# Patient Record
Sex: Female | Born: 1960
Health system: Southern US, Community
[De-identification: ages and names within clinical notes are randomized; demographics above are authoritative.]

## PROBLEM LIST (undated history)

## (undated) DIAGNOSIS — R4189 Other symptoms and signs involving cognitive functions and awareness: Secondary | ICD-10-CM

## (undated) DIAGNOSIS — F329 Major depressive disorder, single episode, unspecified: Secondary | ICD-10-CM

## (undated) DIAGNOSIS — K589 Irritable bowel syndrome without diarrhea: Secondary | ICD-10-CM

## (undated) DIAGNOSIS — I10 Essential (primary) hypertension: Secondary | ICD-10-CM

## (undated) DIAGNOSIS — I7381 Erythromelalgia: Secondary | ICD-10-CM

## (undated) DIAGNOSIS — I498 Other specified cardiac arrhythmias: Secondary | ICD-10-CM

## (undated) DIAGNOSIS — G43909 Migraine, unspecified, not intractable, without status migrainosus: Secondary | ICD-10-CM

## (undated) DIAGNOSIS — I499 Cardiac arrhythmia, unspecified: Secondary | ICD-10-CM

## (undated) DIAGNOSIS — R112 Nausea with vomiting, unspecified: Secondary | ICD-10-CM

## (undated) DIAGNOSIS — E785 Hyperlipidemia, unspecified: Secondary | ICD-10-CM

## (undated) DIAGNOSIS — F32A Depression, unspecified: Secondary | ICD-10-CM

## (undated) DIAGNOSIS — D751 Secondary polycythemia: Secondary | ICD-10-CM

## (undated) DIAGNOSIS — D649 Anemia, unspecified: Secondary | ICD-10-CM

## (undated) DIAGNOSIS — Z9889 Other specified postprocedural states: Secondary | ICD-10-CM

## (undated) DIAGNOSIS — R000 Tachycardia, unspecified: Secondary | ICD-10-CM

## (undated) DIAGNOSIS — F419 Anxiety disorder, unspecified: Secondary | ICD-10-CM

## (undated) DIAGNOSIS — R748 Abnormal levels of other serum enzymes: Secondary | ICD-10-CM

## (undated) HISTORY — DX: Depression, unspecified: F32.A

## (undated) HISTORY — DX: Other symptoms and signs involving cognitive functions and awareness: R41.89

## (undated) HISTORY — DX: Major depressive disorder, single episode, unspecified: F32.9

## (undated) HISTORY — DX: Essential (primary) hypertension: I10

## (undated) HISTORY — DX: Anemia, unspecified: D64.9

## (undated) HISTORY — DX: Irritable bowel syndrome, unspecified: K58.9

## (undated) HISTORY — DX: Anxiety disorder, unspecified: F41.9

## (undated) HISTORY — PX: CARPAL TUNNEL RELEASE: SHX101

## (undated) HISTORY — DX: Tachycardia, unspecified: R00.0

## (undated) HISTORY — DX: Migraine, unspecified, not intractable, without status migrainosus: G43.909

## (undated) HISTORY — DX: Other specified cardiac arrhythmias: I49.8

## (undated) HISTORY — PX: OTHER SURGICAL HISTORY: SHX169

## (undated) HISTORY — DX: Erythromelalgia: I73.81

## (undated) HISTORY — DX: Hyperlipidemia, unspecified: E78.5

## (undated) HISTORY — PX: TRIGGER FINGER RELEASE: SHX641

## (undated) HISTORY — DX: Secondary polycythemia: D75.1

## (undated) HISTORY — DX: Cardiac arrhythmia, unspecified: I49.9

## (undated) HISTORY — PX: TUBAL LIGATION: SHX77

---

## 1998-07-18 ENCOUNTER — Other Ambulatory Visit: Admission: RE | Admit: 1998-07-18 | Discharge: 1998-07-18 | Payer: Self-pay | Admitting: Obstetrics and Gynecology

## 1998-12-06 ENCOUNTER — Other Ambulatory Visit: Admission: RE | Admit: 1998-12-06 | Discharge: 1998-12-06 | Payer: Self-pay | Admitting: Obstetrics and Gynecology

## 1999-06-07 ENCOUNTER — Ambulatory Visit (HOSPITAL_BASED_OUTPATIENT_CLINIC_OR_DEPARTMENT_OTHER): Admission: RE | Admit: 1999-06-07 | Discharge: 1999-06-07 | Payer: Self-pay | Admitting: Orthopedic Surgery

## 1999-12-11 ENCOUNTER — Other Ambulatory Visit: Admission: RE | Admit: 1999-12-11 | Discharge: 1999-12-11 | Payer: Self-pay | Admitting: Obstetrics and Gynecology

## 2000-10-27 ENCOUNTER — Encounter: Payer: Self-pay | Admitting: Emergency Medicine

## 2000-10-27 ENCOUNTER — Emergency Department (HOSPITAL_COMMUNITY): Admission: EM | Admit: 2000-10-27 | Discharge: 2000-10-27 | Payer: Self-pay | Admitting: Emergency Medicine

## 2001-03-05 ENCOUNTER — Other Ambulatory Visit: Admission: RE | Admit: 2001-03-05 | Discharge: 2001-03-05 | Payer: Self-pay | Admitting: Obstetrics and Gynecology

## 2002-04-01 ENCOUNTER — Other Ambulatory Visit: Admission: RE | Admit: 2002-04-01 | Discharge: 2002-04-01 | Payer: Self-pay | Admitting: Obstetrics and Gynecology

## 2002-05-31 ENCOUNTER — Emergency Department (HOSPITAL_COMMUNITY): Admission: EM | Admit: 2002-05-31 | Discharge: 2002-05-31 | Payer: Self-pay | Admitting: Emergency Medicine

## 2002-05-31 ENCOUNTER — Encounter: Payer: Self-pay | Admitting: Emergency Medicine

## 2002-07-01 ENCOUNTER — Ambulatory Visit (HOSPITAL_COMMUNITY): Admission: RE | Admit: 2002-07-01 | Discharge: 2002-07-02 | Payer: Self-pay | Admitting: Neurological Surgery

## 2002-07-01 ENCOUNTER — Encounter: Payer: Self-pay | Admitting: Neurological Surgery

## 2003-04-21 ENCOUNTER — Other Ambulatory Visit: Admission: RE | Admit: 2003-04-21 | Discharge: 2003-04-21 | Payer: Self-pay | Admitting: Obstetrics and Gynecology

## 2004-05-10 ENCOUNTER — Ambulatory Visit: Payer: Self-pay | Admitting: Internal Medicine

## 2004-05-14 ENCOUNTER — Ambulatory Visit: Payer: Self-pay | Admitting: Internal Medicine

## 2004-05-24 ENCOUNTER — Ambulatory Visit: Payer: Self-pay | Admitting: Internal Medicine

## 2004-05-31 ENCOUNTER — Ambulatory Visit: Payer: Self-pay

## 2004-06-01 ENCOUNTER — Ambulatory Visit (HOSPITAL_COMMUNITY): Admission: RE | Admit: 2004-06-01 | Discharge: 2004-06-01 | Payer: Self-pay | Admitting: Internal Medicine

## 2004-06-14 ENCOUNTER — Other Ambulatory Visit: Admission: RE | Admit: 2004-06-14 | Discharge: 2004-06-14 | Payer: Self-pay | Admitting: Obstetrics and Gynecology

## 2004-09-20 ENCOUNTER — Ambulatory Visit: Payer: Self-pay | Admitting: Internal Medicine

## 2005-10-09 ENCOUNTER — Ambulatory Visit: Payer: Self-pay | Admitting: Internal Medicine

## 2005-10-17 ENCOUNTER — Ambulatory Visit: Payer: Self-pay

## 2005-11-15 ENCOUNTER — Ambulatory Visit: Payer: Self-pay | Admitting: Internal Medicine

## 2005-12-16 ENCOUNTER — Ambulatory Visit: Payer: Self-pay | Admitting: Internal Medicine

## 2005-12-24 ENCOUNTER — Ambulatory Visit: Payer: Self-pay | Admitting: Internal Medicine

## 2006-10-17 ENCOUNTER — Encounter (INDEPENDENT_AMBULATORY_CARE_PROVIDER_SITE_OTHER): Payer: Self-pay | Admitting: *Deleted

## 2006-10-17 LAB — CONVERTED CEMR LAB

## 2006-10-17 LAB — HM MAMMOGRAPHY

## 2006-11-13 ENCOUNTER — Ambulatory Visit: Payer: Self-pay | Admitting: Internal Medicine

## 2006-11-13 LAB — CONVERTED CEMR LAB
BUN: 9 mg/dL (ref 6–23)
CO2: 28 meq/L (ref 19–32)
Calcium: 9.5 mg/dL (ref 8.4–10.5)
Chloride: 100 meq/L (ref 96–112)
Creatinine, Ser: 0.6 mg/dL (ref 0.4–1.2)
GFR calc Af Amer: 139 mL/min
GFR calc non Af Amer: 115 mL/min
Glucose, Bld: 107 mg/dL — ABNORMAL HIGH (ref 70–99)
Potassium: 3.4 meq/L — ABNORMAL LOW (ref 3.5–5.1)
Sodium: 136 meq/L (ref 135–145)

## 2007-01-08 ENCOUNTER — Ambulatory Visit: Payer: Self-pay | Admitting: Internal Medicine

## 2007-02-25 ENCOUNTER — Telehealth: Payer: Self-pay | Admitting: Internal Medicine

## 2007-03-05 ENCOUNTER — Telehealth: Payer: Self-pay | Admitting: Internal Medicine

## 2007-04-28 ENCOUNTER — Encounter: Payer: Self-pay | Admitting: *Deleted

## 2007-04-28 DIAGNOSIS — R000 Tachycardia, unspecified: Secondary | ICD-10-CM | POA: Insufficient documentation

## 2007-04-28 DIAGNOSIS — N920 Excessive and frequent menstruation with regular cycle: Secondary | ICD-10-CM | POA: Insufficient documentation

## 2007-04-28 DIAGNOSIS — F341 Dysthymic disorder: Secondary | ICD-10-CM | POA: Insufficient documentation

## 2007-04-28 DIAGNOSIS — I10 Essential (primary) hypertension: Secondary | ICD-10-CM | POA: Insufficient documentation

## 2007-04-28 DIAGNOSIS — K589 Irritable bowel syndrome without diarrhea: Secondary | ICD-10-CM | POA: Insufficient documentation

## 2007-04-28 DIAGNOSIS — M5412 Radiculopathy, cervical region: Secondary | ICD-10-CM | POA: Insufficient documentation

## 2007-04-28 DIAGNOSIS — G43909 Migraine, unspecified, not intractable, without status migrainosus: Secondary | ICD-10-CM | POA: Insufficient documentation

## 2007-05-28 ENCOUNTER — Ambulatory Visit: Payer: Self-pay | Admitting: Internal Medicine

## 2007-08-27 ENCOUNTER — Ambulatory Visit: Payer: Self-pay | Admitting: Internal Medicine

## 2007-08-27 DIAGNOSIS — M7120 Synovial cyst of popliteal space [Baker], unspecified knee: Secondary | ICD-10-CM | POA: Insufficient documentation

## 2007-12-14 ENCOUNTER — Ambulatory Visit: Payer: Self-pay | Admitting: Internal Medicine

## 2007-12-14 LAB — CONVERTED CEMR LAB
ALT: 26 units/L (ref 0–35)
AST: 22 units/L (ref 0–37)
Cholesterol: 192 mg/dL (ref 0–200)
HDL: 40.2 mg/dL (ref >39.0)
LDL Cholesterol: 129 mg/dL — ABNORMAL HIGH (ref 0–99)
Total CHOL/HDL Ratio: 4.8
Triglycerides: 114 mg/dL (ref 0–149)
VLDL: 23 mg/dL (ref 0–40)

## 2007-12-17 ENCOUNTER — Ambulatory Visit: Payer: Self-pay | Admitting: Internal Medicine

## 2008-02-25 ENCOUNTER — Ambulatory Visit: Payer: Self-pay | Admitting: Internal Medicine

## 2008-09-26 ENCOUNTER — Telehealth: Payer: Self-pay | Admitting: Internal Medicine

## 2008-12-13 ENCOUNTER — Ambulatory Visit: Payer: Self-pay | Admitting: Internal Medicine

## 2009-03-14 ENCOUNTER — Telehealth (INDEPENDENT_AMBULATORY_CARE_PROVIDER_SITE_OTHER): Payer: Self-pay | Admitting: *Deleted

## 2009-07-27 ENCOUNTER — Telehealth: Payer: Self-pay | Admitting: Internal Medicine

## 2009-08-03 ENCOUNTER — Ambulatory Visit: Payer: Self-pay | Admitting: Internal Medicine

## 2009-08-03 DIAGNOSIS — N393 Stress incontinence (female) (male): Secondary | ICD-10-CM | POA: Insufficient documentation

## 2009-08-03 DIAGNOSIS — R7989 Other specified abnormal findings of blood chemistry: Secondary | ICD-10-CM | POA: Insufficient documentation

## 2009-08-03 LAB — CONVERTED CEMR LAB
BUN: 14 mg/dL (ref 6–23)
Basophils Absolute: 0 10*3/uL (ref 0.0–0.1)
Basophils Relative: 0.5 % (ref 0.0–3.0)
CO2: 33 meq/L — ABNORMAL HIGH (ref 19–32)
Calcium: 9.7 mg/dL (ref 8.4–10.5)
Chloride: 98 meq/L (ref 96–112)
Cholesterol: 188 mg/dL (ref 0–200)
Creatinine, Ser: 0.6 mg/dL (ref 0.4–1.2)
Eosinophils Absolute: 0.1 10*3/uL (ref 0.0–0.7)
Eosinophils Relative: 2 % (ref 0.0–5.0)
GFR calc non Af Amer: 110.97 mL/min (ref >60)
Glucose, Bld: 75 mg/dL (ref 70–99)
HCT: 41.7 % (ref 36.0–46.0)
HDL: 48.6 mg/dL (ref >39.00)
Hemoglobin: 14.3 g/dL (ref 12.0–15.0)
LDL Cholesterol: 111 mg/dL — ABNORMAL HIGH (ref 0–99)
Lymphocytes Relative: 25.7 % (ref 12.0–46.0)
Lymphs Abs: 1.8 10*3/uL (ref 0.7–4.0)
MCHC: 34.3 g/dL (ref 30.0–36.0)
MCV: 86.1 fL (ref 78.0–100.0)
Monocytes Absolute: 0.8 10*3/uL (ref 0.1–1.0)
Monocytes Relative: 11.4 % (ref 3.0–12.0)
Neutro Abs: 4.2 10*3/uL (ref 1.4–7.7)
Neutrophils Relative %: 60.4 % (ref 43.0–77.0)
Platelets: 283 10*3/uL (ref 150.0–400.0)
Potassium: 4.1 meq/L (ref 3.5–5.1)
RBC: 4.84 M/uL (ref 3.87–5.11)
RDW: 13.2 % (ref 11.5–14.6)
Sodium: 137 meq/L (ref 135–145)
Total CHOL/HDL Ratio: 4
Triglycerides: 144 mg/dL (ref 0.0–149.0)
VLDL: 28.8 mg/dL (ref 0.0–40.0)
WBC: 6.9 10*3/uL (ref 4.5–10.5)

## 2009-12-15 ENCOUNTER — Ambulatory Visit: Payer: Self-pay | Admitting: Internal Medicine

## 2010-04-17 NOTE — Progress Notes (Signed)
  Phone Note Refill Request Message from:  Fax from Pharmacy  Refills Requested: Medication #1:  PAROXETINE HCL 20 MG  TABS 1 once daily Pt has not been seen since 2009, Please Advise refill.  Initial call taken by: Ami Bullins CMA,  Jul 27, 2009 9:55 AM  Follow-up for Phone Call        ok to fill x 1 month. Needs OV Follow-up by: Jacques Navy MD,  Jul 27, 2009 6:22 PM    Prescriptions: PAROXETINE HCL 20 MG  TABS (PAROXETINE HCL) 1 once daily  #30.0 Each x 0   Entered by:   Ami Bullins CMA   Authorized by:   Jacques Navy MD   Signed by:   Bill Salinas CMA on 07/28/2009   Method used:   Electronically to        [id number]* (retail)       [address]       [place], [place]  [address]       Ph: [phone]       Fax: [phone]   RxID:   [id number]

## 2010-04-17 NOTE — Assessment & Plan Note (Signed)
Summary: needs prescription/does not want cpxl/cd   Vital Signs:  Patient profile:   50 year old female Height:      66 inches Weight:      161 pounds BMI:     26.08 O2 Sat:      96 % on Room air Temp:     97.3 degrees F oral Pulse rate:   54 / minute BP sitting:   98 / 62  (left arm) Cuff size:   regular  Vitals Entered By: Bill Salinas CMA (Aug 03, 2009 11:05 AM)  O2 Flow:  Room air CC: follow-up visit, pt here for refill on meds, all medications need to be sent to [facility] on [address] [facility] in compt/ ab   Primary Care Provider:  Jacques Navy MD  CC:  follow-up visit, pt here for refill on meds, and all medications need to be sent to [facility] on [address] and [facility] in compt/ ab.  History of Present Illness: Patient is here for yearly check up and medication refill. She reports good health with no major changes recently. She has been to her OB/Gyn within the past month for full exam and pap.    She reports that she is currently at the lowest stress level she has been at for a while.  She is currently caring for both of her parents.   She is planning to look for a job in the near future.  Current Medications (verified): 1)  Enalapril-Hydrochlorothiazide 10-25 Mg  Tabs (Enalapril-Hydrochlorothiazide) .Marland Kitchen.. 1 Once Daily 2)  Paroxetine Hcl 20 Mg  Tabs (Paroxetine Hcl) .Marland Kitchen.. 1 Once Daily 3)  Metoprolol Tartrate 25 Mg  Tabs (Metoprolol Tartrate) .Marland Kitchen.. 1 By Mouth Daily  Allergies (verified): 1)  ! Codeine  Past History:  Past Medical History: Last updated: 04/28/2007 TACHYCARDIA (ICD-785.0) MENORRHAGIA (ICD-626.2) IRRITABLE BOWEL SYNDROME (ICD-564.1) HYPERTENSION (ICD-401.9) MIGRAINE HEADACHE (ICD-346.90) DEPRESSION/ANXIETY (ICD-300.4)    Past Surgical History: Last updated: 04/28/2007 * BILATERAL MYOFASCIAL RELEASE FOR RECURRENT PLANTAR FACIITIS. SPONDYLOSIS, CERVICAL, WITH RADICULOPATHY (ICD-723.4) TUBAL LIGATION, HX OF (ICD-V26.51)  Social  History: Last updated: 05/28/2007 1 year [employer] married '86-divorced after 71yrs; remarried '96 No children, 2 step-children Work - Airline pilot, now unemployed.  Family History: father -'79: HTN, DM, CAD/CABG, CHF, PVD/PAD, Lipids, pancreatitis mother - '36: breast cancer-left mastectomy; ITP; asthma; TIA's; alzheimers Neg- colon cancer maternal GM- Alzheimer's  Review of Systems       The patient complains of weight loss, weight gain, and incontinence.  The patient denies anorexia, fever, vision loss, decreased hearing, chest pain, dyspnea on exertion, peripheral edema, prolonged cough, headaches, abdominal pain, melena, hematochezia, muscle weakness, and depression.         Patient had lost approximately 40 pounds since her last visit in 2009.  She has subsequently regained approximately 20pounds.  Reports some stress incontinence with cough/sneeze.  Physical Exam  General:  An alert,  Rubenesque woman in no apparent distress. Head:  normocephalic and atraumatic.   Eyes:  vision grossly intact, pupils equal, pupils round, pupils reactive to light, pupils react to accomodation, corneas and lenses clear, no injection, no iris abnormalities, no optic disk abnormalities, and no retinal abnormalitiies.   Ears:  R ear normal, L ear normal, and no external deformities.   Nose:  no external deformity.   Mouth:  good dentition and pharynx pink and moist.   Neck:  supple, no masses, no thyromegaly, and no thyroid nodules or tenderness.   Lungs:  normal respiratory effort,  no accessory muscle use, normal breath sounds, no crackles, and no wheezes.   Heart:  regular rhythm, no murmur, no gallop, no rub, no JVD, and bradycardia.   Abdomen:  soft, non-tender, no distention, no masses, no guarding, and no rigidity.   Msk:  normal ROM, no joint tenderness, no joint swelling, no joint warmth, no redness over joints, and no joint deformities.   Pulses:  R radial normal, R dorsalis pedis  normal, L radial normal, and L dorsalis pedis normal.   Extremities:  no pedal edema Neurologic:  alert & oriented X3, cranial nerves II-XII intact, strength normal in all extremities, gait normal, and DTRs symmetrical and normal.   Skin:  turgor normal and color normal.     Impression & Recommendations:  Problem # 1:  HYPERTENSION (ICD-401.9) Blood pressure in office today is well below goal with bradycardia.    Plan- Discontinue Metoprolol          Continue Enalapril-HCTZ         Check electrolytes   Her updated medication list for this problem includes:    Enalapril-hydrochlorothiazide 10-25 Mg Tabs (Enalapril-hydrochlorothiazide) .Marland Kitchen... 1 once daily    Metoprolol Tartrate 25 Mg Tabs (Metoprolol tartrate) .Marland Kitchen... 1 by mouth daily  Orders: TLB-BMP (Basic Metabolic Panel-BMET) (80048-METABOL)  Problem # 2:  DEPRESSION/ANXIETY (ICD-300.4) Patient feels that she is at a relatively low stress level at this time.  By her report there are issues in regard to her conjugal relationship. She reports continued benefit from Paroxetine with stabilization of mood in response to various stressors.  Plan- Continue Paroxetine HCl 20mg  once daily as patient continues to derive benefit.  Problem # 3:  Preventive Health Care (ICD-V70.0) Patient has had recent breast and pelvic exam with Dr. Tenny Craw.  She had a normal Pap and Mammogram in April of 2011. Will do routine labwork today. She has not had an eye exam in 2+ years but will make an appointment for an eye exam soon.  Problem # 4:  URINARY INCONTINENCE, STRESS, FEMALE, MILD (ICD-625.6) Patient reports onset of occasional small amounts of incontinence with cough/sneeze.  Plan- She was encouraged to try to do Kegel exercises regularly.  Complete Medication List: 1)  Enalapril-hydrochlorothiazide 10-25 Mg Tabs (Enalapril-hydrochlorothiazide) .Marland Kitchen.. 1 once daily 2)  Paroxetine Hcl 20 Mg Tabs (Paroxetine hcl) .Marland Kitchen.. 1 once daily 3)  Metoprolol Tartrate  25 Mg Tabs (Metoprolol tartrate) .Marland Kitchen.. 1 by mouth daily  Other Orders: TLB-Lipid Panel (80061-LIPID) TLB-CBC Platelet - w/Differential (85025-CBCD)   Avri Curet Note: All result statuses are Final unless otherwise noted.  Tests: (1) BMP (METABOL)   Sodium                    137 mEq/L                   135-145   Potassium                 4.1 mEq/L                   3.5-5.1   Chloride                  98 mEq/L                    96-112   Carbon Dioxide       [H]  33 mEq/L  19-32   Glucose                   75 mg/dL                    16-10   BUN                       14 mg/dL                    9-60   Creatinine                0.6 mg/dL                   4.5-4.0   Calcium                   9.7 mg/dL                   9.8-11.9   GFR                       110.97 mL/min               >60  Tests: (2) Lipid Panel (LIPID)   Cholesterol               188 mg/dL                   1-478     ATP III Classification            Desirable:  < 200 mg/dL                    Borderline High:  200 - 239 mg/dL               High:  > = 240 mg/dL   Triglycerides             144.0 mg/dL                 2.9-562.1     Normal:  <150 mg/dL     Borderline High:  308 - 199 mg/dL   HDL                       65.78 mg/dL                 >46.96   VLDL Cholesterol          28.8 mg/dL                  2.9-52.8   LDL Cholesterol      [H]  413 mg/dL                   2-44  CHO/HDL Ratio:  CHD Risk                             4                    Men          Women     1/2 Average Risk     3.4          3.3     Average Risk          5.0  4.4     2X Average Risk          9.6          7.1     3X Average Risk          15.0          11.0                           Tests: (3) CBC Platelet w/Diff (CBCD)   White Cell Count          6.9 K/uL                    4.5-10.5   Red Cell Count            4.84 Mil/uL                 3.87-5.11   Hemoglobin                14.3 g/dL                    02.5-85.2   Hematocrit                41.7 %                      36.0-46.0   MCV                       86.1 fl                     78.0-100.0   MCHC                      34.3 g/dL                   77.8-24.2   RDW                       13.2 %                      11.5-14.6   Platelet Count            283.0 K/uL                  150.0-400.0   Neutrophil %              60.4 %                      43.0-77.0   Lymphocyte %              25.7 %                      12.0-46.0   Monocyte %                11.4 %                      3.0-12.0   Eosinophils%              2.0 %                       0.0-5.0   Basophils %  0.5 %                       0.0-3.0   Neutrophill Absolute      4.2 K/uL                    1.4-7.7   Lymphocyte Absolute       1.8 K/uL                    0.7-4.0   Monocyte Absolute         0.8 K/uL                    0.1-1.0  Eosinophils, Absolute                             0.1 K/uL                    0.0-0.7   Basophils Absolute        0.0 K/uL                    0.0-0.1Prescriptions: PAROXETINE HCL 20 MG  TABS (PAROXETINE HCL) 1 once daily  #30.0 Each x 12   Entered and Authorized by:   Jacques Navy MD   Signed by:   Jacques Navy MD on 08/03/2009   Method used:   Electronically to        [facility] #[id number]* (retail)       [address]       [place], [place]  [address]       Ph: [phone]       Fax: [phone]   RxID:   [id number] ENALAPRIL-HYDROCHLOROTHIAZIDE 10-25 MG  TABS (ENALAPRIL-HYDROCHLOROTHIAZIDE) 1 once daily  #30.0 Each x 12   Entered and Authorized by:   Jacques Navy MD   Signed by:   Jacques Navy MD on 08/03/2009   Method used:   Electronically to        [facility] #[id number]* (retail)       [address]       [place], [place]  [address]       Ph: [phone]       Fax: [phone]   RxID:   [id number]

## 2010-04-17 NOTE — Assessment & Plan Note (Signed)
Summary: FLU SHOT/MEN Natale Milch  Nurse Visit   Allergies: 1)  ! Codeine  Orders Added: 1)  Admin 1st Vaccine [90471] 2)  Flu Vaccine 51yrs + [29562] Flu Vaccine Consent Questions     Do you have a history of severe allergic reactions to this vaccine? no    Any prior history of allergic reactions to egg and/or gelatin? no    Do you have a sensitivity to the preservative Thimersol? no    Do you have a past history of Guillan-Barre Syndrome? no    Do you currently have an acute febrile illness? no    Have you ever had a severe reaction to latex? no    Vaccine information given and explained to patient? yes    Are you currently pregnant? no    Lot Number:[id number]   Exp Date:09/15/2010   Site Given  Left Deltoid IM .lbflu

## 2010-07-31 NOTE — Assessment & Plan Note (Signed)
[facility]                           PRIMARY CARE OFFICE NOTE   NAME:[name] [name], [name] [name]                       MRN:          [id number]  DATE:11/13/2006                            DOB:          06-Jan-1961    [name] [name] is a 50 year old woman, well known to the practice, who  presents for followup evaluation and exam.  She was last seen December 24, 2005.   INTERVAL HISTORY:  1. Cardiovascular:  The patient has been evaluated for tach      arrhythmia.  She has been seen by Dr. Berton Mount.  She is on      metoprolol which helps keep her rapid heart rate under good      control.  2. Health maintenance:  The patient reports she is up to date with her      gynecologist, Dr. Tenny Craw, with an examination in August of 2008 and a      last mammogram in August of 2008.   PAST MEDICAL/FAMILY/SOCIAL HISTORY:  Well documented in my note of  May 14, 2004 without particular change.   SOCIAL HISTORY UPDATE:  The patient continues to remain unemployed  voluntarily.  She and her husband have moved into a new home which they  have had renovated.  She reports her marriage is in good health.   CURRENT MEDICATIONS:  1. Hyzaar 50/12.5 one daily.  2. Metoprolol 50 mg daily.  3. Estrostep FE daily.  4. Paxil CR 12.5 mg daily.   REVIEW OF SYSTEMS:  Negative for any fever, sweats or chills.  She has  had a 10-pound weight gain since 2006.  She has had an eye exam in the  last 24 months.  The patient has had no cardiovascular complaints except  for the history of present illness.  No respiratory problems.  The  patient does have some mildly increased indigestion treated with over-  the-counter antacids.  No GU or musculoskeletal complaints.   PHYSICAL EXAMINATION:  Temperature was 97.7, blood pressure 107/74,  pulse 68, weight 179.  GENERAL APPEARANCE:  This is a heavyset Caucasian woman in no acute  distress.  HEENT EXAM:  Normocephalic, atraumatic.  EACs and TMs  were unremarkable.  Oropharynx with native dentition in good repair.  No buccal or palate  lesions were noted.  Posterior pharynx was clear.  Conjunctivae and  sclerae were clear.  PERRLA.  EOMI.  Funduscopic exam was unremarkable.  NECK:  Supple without thyromegaly.  NODES:  No lymphadenopathy was noted in the cervical or supraclavicular  regions.  CHEST:  No CVA tenderness.  LUNGS:  Clear to auscultation and percussion.  CARDIOVASCULAR:  2+ radial pulses, no JVD or carotid bruits.  She had a  quiet precordium with a regular rate and rhythm.  No murmurs, rubs, or  gallops were appreciated.  BREAST EXAM:  Deferred to gynecology.  ABDOMEN:  Soft, no guarding, no rebound.  No organosplenomegaly was  noted.  PELVIC/RECTAL EXAM:  Deferred to gynecology.  EXTREMITIES:  Without clubbing, cyanosis, edema, no deformities were  noted.   LABORATORY DATA:  Basic  metabolic panel revealed a sodium of 136,  potassium of 3.4, glucose was 107, BUN 9, creatinine 0.6.   ASSESSMENT AND PLAN:  1. Hypertension:  The patient's blood pressure is very well controlled      on her present medications.  She will continue the same.  2. Cardiovascular:  The patient with a tachycardia/tach arrhythmia,      followed and evaluated by Dr. Graciela Husbands, currently doing well at this      time on metoprolol which she will continue.  3. Irritable bowel syndrome:  Stable.  4. Anxiety:  The patient is doing very well with Paxil CR and she will      continue the same.  5. Health maintenance:  The patient is currently up to date with her      gynecologist.  She would be a candidate for colorectal cancer      screening at age 27.   In summary, this is a very pleasant woman who seems to be medically  stable.  She is asked to return to see me in 1 year or on a p.r.n.  basis.     Rosalyn Gess Norins, MD  Electronically Signed    MEN/MedQ  DD: 11/14/2006  DT: 11/14/2006  Job #: [id number]   cc:   Ms. Greggory Keen

## 2010-08-03 NOTE — Op Note (Signed)
NAME:  [name] [name], [name] [name]                          ACCOUNT NO.:  [id number]   MEDICAL RECORD NO.:  [id number]                   PATIENT TYPE:  OIB   LOCATION:  2861                                 FACILITY:  [facility]   PHYSICIAN:  Stefani Dama, M.D.               DATE OF BIRTH:  09/19/1960   DATE OF PROCEDURE:  07/01/2002  DATE OF DISCHARGE:                                 OPERATIVE REPORT   PREOPERATIVE DIAGNOSES:  C5-6, C6-7 spondylosis with cervical radiculopathy  on the left.   POSTOPERATIVE DIAGNOSES:  C5-6, C6-7 spondylosis with cervical radiculopathy  on the left.   PROCEDURE:  Anterior cervical decompression of C5-6 and C6-7. Arthrodesis  with structural allograft, Synthes plate fixation Z6-X0.   SURGEON:  Stefani Dama, M.D.   FIRST ASSISTANT:  Cristi Loron, M.D.   ANESTHESIA:  General endotracheal.   INDICATIONS FOR PROCEDURE:  The patient is a 50 year old individual who has  had significant back, neck pain, headache and left arm pain. She has severe  spondylitic disease of C5-6 and the C6-7 level with weakness in the left arm  in the C7 and the C6 distribution. After having symptoms for a prolonged  period of time, I advised that she should undergo surgical decompression of  the 2 levels where she has evidence of cord compression; though she is not  frankly myelopathic.   DESCRIPTION OF PROCEDURE:  The patient was brought to the operating room  supine on the stretcher. After the smooth induction of general endotracheal  anesthesia, she was placed on 5 pounds of Holter traction, neck was shaved,  prepped with DuraPrep and draped in a sterile fashion.  Transverse incision  was created on the left side of the neck and carried down through the  platysma. The plane between the sternocleidomastoid and the strap muscles  was dissected bluntly until the prevertebral space was reached.   First identifiable disk space was noted to be that of C6-7. Dissection was  then carried cephalad to expose the C5-6 disk space. The longus colli muscle  was stripped off of either side and the self-retaining Caspar retractor was  placed in the wound.  Diskectomy was then carried out at C5-6 using a #15  blade to open the anterior longitudinal ligament and also using a Kerrison  rongeurs to remove ventral osteophyte.  As the disk space was entered, it  was cleared and opened to either side, removing significant posterior  osteophytes from the inferior margin of the body of C5, using a high-speed  air drill and 0.3 mm dissecting tool. Uncinate process spurs were then  similarly removed from either side.  Lateral aspects were then cleared until  the C6 nerve root on either side was well decompressed, removing significant  bony foraminal stenosis, in addition to the soft tissue that was in the  foramen.   There was subligamentous disk herniation behind the  superior margin of the  body of C6.  With this being decompressed, a graft was fitted. This was a 6  mm lordotic graft from the Synthes set which was packed with the patient's  own bone from the uncinate process spurs, which were cut into small portions  and fitted into the center of the bone graft.   Attention was then turned to C6-7 where a similar procedure was carried out.  Here, the endplates were similarly drilled. The spurs were noted to be  significantly smaller and the lateral recesses were well decompressed.  Hemostasis was achieved in the epidural spaces. Then a 7 mm slightly  lordotic graft was placed into the interspace at C6-C7 level. A 37 mm  standard type Synthes plate was then affixed to the ventral aspect of the  vertebral bodies with 6 locking 4 x 14 mm screws.  Hemostasis was achieved  in the soft tissues. Localization ws checked carefully radiographically, and  then the platysma was closed with 2-0 Vicryl in interrupted fashion and 3-0  Vicryl was used to close the subcuticular tissues.   DermaBond was placed on  the skin.  The patient tolerated the procedure well, taken to the recovery  room in stable condition.                                                  Stefani Dama, M.D.    Merla Riches  D:  07/01/2002  T:  07/01/2002  Job:  [id number]

## 2010-08-03 NOTE — Op Note (Signed)
[place]  Patient:    [name] [name], [name] [name]                       MRN: [id number] Proc. Date: 06/07/99 Adm. Date:  [id number] Attending:  Colbert Ewing                           Operative Report  PREOPERATIVE DIAGNOSIS:  Right carpal tunnel syndrome.  POSTOPERATIVE DIAGNOSIS:  Right carpal tunnel syndrome.  OPERATIVE PROCEDURE:  Right carpal tunnel release.  SURGEON:  Loreta Ave, M.D.  ASSISTANT:  Arlys John D. Petrarca, P.A.  ANESTHESIA:  IV regional with sedation.  SPECIMENS:  None.  CULTURES:  None.  COMPLICATIONS:  None.  DRESSING:  Soft compressive with bulky hand dressing and splint.  PROCEDURE:  Patient was brought to the operating room and placed on the operating table in the supine position.  After adequate anesthesia had been obtained, prepped and draped in the usual sterile fashion with a forearm tourniquet inflated and n place.  Curved incision along the thenar eminence extending slightly ulnarward t the distal wrist crease.  Skin and subcutaneous tissue were divided. Hemostasis was obtained with bipolar cautery.  Flexor retinaculum exposed and incised under direct visualization from the forearm and fascia proximally to the palmar arch distally.  Median nerve had moderate constriction throughout and a very tight epineurium.  Epineurotomy and carpal tunnel release markedly improved this. Digital branches, motor branches identified, protected, and completely decompressed.  Wound irrigated.  No other abnormalities within the canal.  Skln  closed with mattress nylon suture.  Sterile compressive dressing applied after Marcaine infiltrated the margin of the wound.  Bulky hand dressing and splint applied.  Tourniquet deflated and anesthesia reversed.  Brought to recovery room. Tolerated surgery well, no complications. DD:  06/07/99 TD:  06/07/99 Job: [id number]

## 2010-08-03 NOTE — Letter (Signed)
November 15, 2005     [name] [name]. Norins, MD  [address]  [place], [place] [address]   RE:  [name], [name]  MRN:  [id number]  /  DOB:  [birth year]   Dear Kathlene November,  Greggory Keen was seen today at your request because of her abnormal event  monitor.  Recalling that her complaints were that she was having excessive  tachycardia at her workouts that were felt to be precluding of her ability  to lose weight, Holter monitor was obtained that demonstrated both sinus  tachycardia as well as frequent ventricular ectopy, which she was known to  have, which appeared to __________  from the right ventricular outflow  tract.   On further review of her history, a couple of things became clear.  1) She  has some orthostatic intolerance.  2) She has some orthostatic intolerance  raising the question about whether she was having some __________  .  In  addition, there was an issue of being aware that her heart rate was racing  fast and that it was my suspicion that this does not represent __________  rather sinus tachycardia based on this monitor.   Because of this we undertook a treadmill test that was very illuminating.  The first part, stage I, her heart rate reached 110 at one minute of  exercise.  At two minutes of exercise, now in stage II, her heart rate was  131, and at three minutes of exercise, heart rate is 165.  Also she has  significant exercise hypertension with a one-minute post exercise blood  pressure of 220/100 and a three-minute post exercise blood pressure of  188/106.  A seven-minute post exercise blood pressure of 146/106.  All  consistent with significant exercise hypertension.   Because of this, I think that there are two issues.  1) She has  inappropriate sinus tachycardia with exercise.  2) She clearly has  significant exercise hypertension.  I do not know whether she has been  evaluated for secondary causes of hypertension, but I have asked that she  follow up with you  about this.  In the interim, we have given her  prescriptions for a variety of beta blocker to see if she can tolerate any  without significant side effects.  These include atenolol 50 mg, Toprol-XL  50, and Nadolol 40.   She is to let us know how it is that she tolerates this.   Thank you very much for asking [place] to see her.    Sincerely,      Duke Salvia, MD, [facility]   SCK/MedQ  DD:  11/15/2005  DT:  11/17/2005  Job #:  [id number]

## 2010-08-03 NOTE — Assessment & Plan Note (Signed)
[facility]                             PRIMARY CARE OFFICE NOTE   NAME:[name], [name]                       MRN:          [id number]  DATE:12/24/2005                            DOB:          12-22-1960    [name] [name] is a 50 year old woman followed for IBS, hypertension and  palpitations who presents for followup evaluation and exam.  She was last  seen in primary care October 09, 2005.  In the interval she has been seen and  evaluated by Dr. Berton Mount for cardiology/electrophysiology.  The patient  did have a stress test in which she demonstrated sinus tachycardia with  exercise and significant exercise-related hypertension.  She had been given  a variety of beta blockers for trial and did do well with metoprolol generic  ER 50 mg daily.  She reports that she has been doing better with better  exercise tolerance.   The patient reports she is up-to-date with Dr. Tenny Craw, her GYN, with recent  exam in spring 2007.  Also mammography in spring 2007.   Past medical history, family history, and social history are well documented  in my note of May 14, 2004.   CURRENT MEDICATIONS:  1. Celebrex 200 mg daily.  2. Estratest daily.  3. Aspirin 81 mg daily.  4. Paxil 12.5 mg daily.  5. Metoprolol ER 50 mg daily.  6. Tylenol on a p.r.n. basis.   REVIEW OF SYSTEMS:  Negative for any constitutional symptoms.  She has had  an eye exam in the last 12 months.  No ENT complaints.  No cardiovascular  complaints with the information as above.  No respiratory problems.  She has  occasional heartburn for which she uses Tums.  She has occasional IBS  manifested as diarrhea but does not require medication at this time.  No GU  problems.  The patient does have some recurrent plantar fasciitis and she  does follow with Dr. Thurston Hole for this.  She has had surgery for this.  No  dermatologic or neurologic problems.   EXAMINATION:  VITAL SIGNS:  Temperature was 97.1,  blood pressure 130/80,  pulse 56, weight 178.  GENERAL APPEARANCE:  This is a heavyset but well-groomed Caucasian female  who looks her stated age, in no acute distress.  HEENT:  Normocephalic, atraumatic.  EACs and TMs were unremarkable.  Oropharynx with native dentition in good repair.  No buccal or palatal  lesions were noted.  Posterior pharynx was clear.  Conjunctivae and sclerae  were clear.  PERRLA, EOMI.  Funduscopic exam was unremarkable.  NECK:  Supple without thyromegaly.  NODES:  No adenopathy was noted in the cervical or supraclavicular region.  CHEST:  No CVA tenderness.  LUNGS:  Clear to auscultation and percussion.  BREAST:  Exam deferred to gynecology.  CARDIOVASCULAR:  Shows 2+ radial pulse.  No JVD, no carotid bruit.  She had  a quiet precordium with a regular rate and rhythm.  I appreciated no  murmurs.  ABDOMEN:  Obese, soft, no guarding or rebound, or organosplenomegaly was  appreciated.  PELVIC AND RECTAL:  Deferred to gynecology.  EXTREMITIES:  Without clubbing, cyanosis, edema, or deformity.  The patient  does have a small calcium deposit over her tibia at the left leg just below  the knee cap.  NEUROLOGIC:  Nonfocal.  SKIN:  Clear, although a full body exam was not performed.   DATA BASE:  Hemoglobin 14.3 g, white count was 5300 with a normal  differential.  Cholesterol is 163, triglycerides 58, HDL was 38.8, LDL was  113.  Chemistries were unremarkable with a glucose of 102.  Electrolytes  were normal.  Kidney function normal with a creatinine of 0.9 and a GFR of  72 mL/minute.  Liver functions were normal.  Thyroid function normal with a  TSH of 1.49.  Urinalysis was negative.   ASSESSMENT AND PLAN:  1. Cardiovascular:  The patient will exertional tachycardia and a      hypertensive responsive to exercise, currently doing much better on      metoprolol.  Plan:  The patient to continue the same.  2. Migraines, stable.  3. Irritable bowel syndrome,  stable.  4. Depression and anxiety:  The patient is currently doing very well,      tolerating Paxil 12.5 mg daily.  5. Health maintenance:  The patient is current and up-to-date with her      gynecologist.  She would be a candidate for colorectal screening at age      53.   SUMMARY:  A very pleasant woman who seems to be medically stable at this  time.  I have asked her to return to see me on p.r.n. basis.            ______________________________  [name] Gess Norins, MD      MEN/MedQ  DD:  12/25/2005  DT:  12/26/2005  Job #:  [id number]   cc:   Polo Riley. [employer]

## 2010-08-20 ENCOUNTER — Other Ambulatory Visit: Payer: Self-pay | Admitting: Internal Medicine

## 2010-09-03 ENCOUNTER — Other Ambulatory Visit: Payer: Self-pay | Admitting: Internal Medicine

## 2010-10-02 ENCOUNTER — Other Ambulatory Visit: Payer: Self-pay | Admitting: Internal Medicine

## 2010-11-01 ENCOUNTER — Other Ambulatory Visit: Payer: Self-pay | Admitting: Internal Medicine

## 2010-11-28 ENCOUNTER — Other Ambulatory Visit: Payer: Self-pay | Admitting: Internal Medicine

## 2010-12-10 ENCOUNTER — Encounter: Payer: Self-pay | Admitting: Internal Medicine

## 2010-12-11 ENCOUNTER — Ambulatory Visit (INDEPENDENT_AMBULATORY_CARE_PROVIDER_SITE_OTHER): Payer: PRIVATE HEALTH INSURANCE | Admitting: Internal Medicine

## 2010-12-11 ENCOUNTER — Ambulatory Visit: Payer: Self-pay | Admitting: Internal Medicine

## 2010-12-13 ENCOUNTER — Other Ambulatory Visit: Payer: Self-pay | Admitting: *Deleted

## 2010-12-13 MED ORDER — PAROXETINE HCL 20 MG PO TABS: 20.0000 mg | ORAL_TABLET | ORAL | Status: DC

## 2010-12-13 MED ORDER — ENALAPRIL-HYDROCHLOROTHIAZIDE 10-25 MG PO TABS: 1.0000 | ORAL_TABLET | Freq: Every day | ORAL | Status: DC

## 2010-12-13 MED ORDER — METOPROLOL TARTRATE 25 MG PO TABS: 25.0000 mg | ORAL_TABLET | Freq: Two times a day (BID) | ORAL | Status: DC

## 2010-12-25 ENCOUNTER — Ambulatory Visit (INDEPENDENT_AMBULATORY_CARE_PROVIDER_SITE_OTHER): Payer: Commercial Managed Care - PPO | Admitting: Internal Medicine

## 2010-12-25 DIAGNOSIS — G43909 Migraine, unspecified, not intractable, without status migrainosus: Secondary | ICD-10-CM

## 2010-12-25 DIAGNOSIS — R7989 Other specified abnormal findings of blood chemistry: Secondary | ICD-10-CM

## 2010-12-25 DIAGNOSIS — F341 Dysthymic disorder: Secondary | ICD-10-CM

## 2010-12-25 DIAGNOSIS — K589 Irritable bowel syndrome without diarrhea: Secondary | ICD-10-CM

## 2010-12-25 DIAGNOSIS — I10 Essential (primary) hypertension: Secondary | ICD-10-CM

## 2010-12-25 DIAGNOSIS — Z0000 Encounter for general adult medical examination without abnormal findings: Secondary | ICD-10-CM

## 2010-12-25 MED ORDER — PAROXETINE HCL 20 MG PO TABS: 20.0000 mg | ORAL_TABLET | ORAL | Status: DC

## 2010-12-25 MED ORDER — ENALAPRIL-HYDROCHLOROTHIAZIDE 10-25 MG PO TABS: 1.0000 | ORAL_TABLET | Freq: Every day | ORAL | Status: DC

## 2010-12-25 MED ORDER — METOPROLOL TARTRATE 25 MG PO TABS: 25.0000 mg | ORAL_TABLET | Freq: Two times a day (BID) | ORAL | Status: DC

## 2010-12-26 ENCOUNTER — Encounter: Payer: Self-pay | Admitting: Internal Medicine

## 2010-12-26 DIAGNOSIS — Z0000 Encounter for general adult medical examination without abnormal findings: Secondary | ICD-10-CM | POA: Insufficient documentation

## 2010-12-26 NOTE — Progress Notes (Signed)
Subjective:    Patient ID: [name] [name], female    DOB: Aug 12, 1960, 50 y.o.   MRN: [id number]  HPI [name] [name] presents for medical follow-up. She was denied extended refills on her medications due to her last office visit being May '11. Unfortunately she had a previous visit but the schedule was 2 hours behind and she had to return to work. She was rescheduled today for "med refill." Again, schedule was 2 hrs behind. She is appropriately irritated and angry since this is causing her to miss work at a new job! Since she is already here she requests that whatever needs to be done for her to get a year's refill Rx  Be done now!  In the interval since May '11 she denies having any major medical illness or change in condition, any surgery or any injury. She reports that she has seen her Gyn for routine pelvic/PAP and breast exam. She reports that she is current with mammography. She isnot sure what labs her gynecologist may have ordered but is unaware of any problems. She has been adherent to her medcial regimen and has had not adverse medication reactions.  Past Medical History  Diagnosis Date  . Depression   . Anxiety   . Hypertension   . IBS (irritable bowel syndrome)   . Migraine, unspecified, without mention of intractable migraine without mention of status migrainosus   . Tachycardia, unspecified   . Excessive or frequent menstruation   . Brachial neuritis or radiculitis NOS    Past Surgical History  Procedure Date  . Tubal ligation   . Bilateral myofascial release for recurrent plantar faciitis    Family History  Problem Relation Age of Onset  . Cancer Mother     breast cancer-left mastectomy  . Asthma Mother   . Alzheimer's disease Mother   . Transient ischemic attack Mother   . Hypertension Father   . Hyperlipidemia Father   . Diabetes Father   . Coronary artery disease Father   . Heart failure Father     CABG  . Pancreatitis Father   . Alzheimer's disease Maternal  Grandmother    History   Social History  . Marital Status: Married    Spouse Name: N/A    Number of Children: 0  . Years of Education: 13   Occupational History  . sales    Social History Main Topics  . Smoking status: Never Smoker   . Smokeless tobacco: Never Used  . Alcohol Use: Yes  . Drug Use: No  . Sexually Active: Yes -- Female partner(s)   Other Topics Concern  . Not on file   Social History Narrative   HSG. 1 year [employer]. Married '86-divorced after 65yrs; remarried  '96. No children, 2 step children. Work-sales. After a period of being unemployed she has been at a new job for a short period of time (October '12). Marriage is stable.       Review of Systems Constitutional:  Negative for fever, chills, activity change and unexpected weight change.  HEENT:  Negative for hearing loss, ear pain, congestion, neck stiffness and postnasal drip. Negative for sore throat or swallowing problems. Negative for dental complaints.   Eyes: Negative for vision loss or change in visual acuity.  Respiratory: Negative for chest tightness and wheezing. Negative for DOE.   Cardiovascular: Negative for chest pain or palpitations. No decreased exercise tolerance Gastrointestinal: No change in bowel habit. No bloating or gas. No reflux or indigestion Genitourinary:  Negative for urgency, frequency, flank pain and difficulty urinating.  Musculoskeletal: Negative for myalgias, back pain, arthralgias and gait problem.  Neurological: Negative for dizziness, tremors, weakness and headaches.  Hematological: Negative for adenopathy.  Psychiatric/Behavioral: Negative for behavioral problems and dysphoric mood.       Objective:   Physical Exam Vitals reviewed-stable with reasonable blood pressure control Gen'l - WNWD mildly overweight white woman in no distress but a little irritated. HEENT - /AT, C&S clear, PERRLA, EOMI. Oropharynx without lesions. Teeth in good repair.\ Neck-  supple, no thyromegaly Chest - no deformity. Breast- deferred to gyn Pulm - normal respirations with no rales, wheezes or rhonchi. Cor- 2+ radial pulse and DP pulse, RRR w/o murmur rubs or gallops. Abdomen - BS+ x 4 quadrants. No guarding rebound or tenderness. Pelvic - deferred to gyn Ext - no joint deformity or inflmmation. Neuro - A&O x 3, CN II-XII intact, DTR's normal, normal gait. Derm- no lesions on face,neck arms, back.   Lab Results  Component Value Date   WBC 6.9 08/03/2009   HGB 14.3 08/03/2009   HCT 41.7 08/03/2009   PLT 283.0 08/03/2009   GLUCOSE 75 08/03/2009   CHOL 188 08/03/2009   TRIG 144.0 08/03/2009   HDL 48.60 08/03/2009   LDLCALC 111* 08/03/2009   ALT 26 12/14/2007   AST 22 12/14/2007   NA 137 08/03/2009   K 4.1 08/03/2009   CL 98 08/03/2009   CREATININE 0.6 08/03/2009   BUN 14 08/03/2009   CO2 33* 08/03/2009   She was too return for updated labs: metabolic panel, lipids.        Assessment & Plan:

## 2010-12-26 NOTE — Assessment & Plan Note (Signed)
BP Readings from Last 3 Encounters:  12/25/10 104/62  08/03/09 98/62  12/17/07 108/66   Great control on her present medications. Lab ordered.  Plan - continue present medication. Rx provided.

## 2010-12-26 NOTE — Assessment & Plan Note (Signed)
Continues to have headaches - with a frequency of 1-3 per week. She feels this is manageable and additional medication is not needed.

## 2010-12-26 NOTE — Assessment & Plan Note (Signed)
Seemingly stable. She continues to take Paxil with good results.  Plan - Rx renewed x 12

## 2010-12-26 NOTE — Assessment & Plan Note (Signed)
No report of any exacerbation of symptoms or need for additional medical management.

## 2010-12-26 NOTE — Assessment & Plan Note (Signed)
Interval history by her report is benign. Physical exam, sans pelvic and breast exam , is normal. Labs ordered and pending. Last mammogram on record is '08 but she assures [place] taht it is more current than that. She is encouraged to have annual mammograms. Colorectal cancer screening: now at age 50 she should consider having routine screening colonoscopy. Immunization Tdap Sept '10.   In summary-  A woam who appears to be medically stable. Labs are pending. Records of outside lab and of mammogram requested.  In order for her to receive more timely service she will transfer her care to Dr. Felicity Coyer - who is her parent's physician. She is clear for routine care for the year but will need mammogram report(s), PAP results for her record.

## 2011-01-10 ENCOUNTER — Telehealth: Payer: Self-pay | Admitting: Internal Medicine

## 2011-01-10 NOTE — Telephone Encounter (Signed)
PT IS AWARE

## 2011-01-10 NOTE — Telephone Encounter (Signed)
Message copied by Etheleen Sia on Thu Jan 10, 2011  4:14 PM ------      Message from: Rene Paci A      Created: Tue Dec 25, 2010  5:29 PM       Not sure I will be much more on time, but I will be happy to help if possible. Thanks, VAL      (CC to schedulers to notify them of same)            ----- Message -----         From: Duke Salvia, MD         Sent: 12/25/2010   3:38 PM           To: Rene Paci, MD            Vikki Ports,            Mrs. Hunnell is wishing to change MDs to someone who may run more on time than I do. She would be very happy to see you in that you already see her parents and she is very pleased with your care. She is nice patient. I did do her annual exam today and refilled her Rx for the year.            Thanks

## 2011-05-07 ENCOUNTER — Other Ambulatory Visit: Payer: Self-pay | Admitting: Internal Medicine

## 2011-05-07 MED ORDER — PAROXETINE HCL 20 MG PO TABS: 20.0000 mg | ORAL_TABLET | ORAL | Status: DC

## 2011-05-07 NOTE — Telephone Encounter (Signed)
Notified pt rx sent to [facility]... 2/19/@:pm/LMB

## 2011-05-07 NOTE — Telephone Encounter (Signed)
The pt called and is hoping to get a refill of Paroxentine 20mg .   Thanks!

## 2011-12-13 ENCOUNTER — Other Ambulatory Visit: Payer: Self-pay | Admitting: Internal Medicine

## 2012-01-02 ENCOUNTER — Other Ambulatory Visit: Payer: Self-pay | Admitting: *Deleted

## 2012-01-02 MED ORDER — ENALAPRIL-HYDROCHLOROTHIAZIDE 10-25 MG PO TABS: 1.0000 | ORAL_TABLET | Freq: Every day | ORAL | Status: DC

## 2012-03-30 ENCOUNTER — Ambulatory Visit (INDEPENDENT_AMBULATORY_CARE_PROVIDER_SITE_OTHER): Payer: Commercial Managed Care - PPO | Admitting: Internal Medicine

## 2012-03-30 ENCOUNTER — Encounter: Payer: Self-pay | Admitting: Internal Medicine

## 2012-03-30 ENCOUNTER — Other Ambulatory Visit (INDEPENDENT_AMBULATORY_CARE_PROVIDER_SITE_OTHER): Payer: Commercial Managed Care - PPO

## 2012-03-30 VITALS — BP 102/70 | HR 67 | Temp 98.3°F | Ht 66.0 in | Wt 173.8 lb

## 2012-03-30 DIAGNOSIS — F329 Major depressive disorder, single episode, unspecified: Secondary | ICD-10-CM

## 2012-03-30 DIAGNOSIS — I10 Essential (primary) hypertension: Secondary | ICD-10-CM

## 2012-03-30 DIAGNOSIS — F341 Dysthymic disorder: Secondary | ICD-10-CM

## 2012-03-30 DIAGNOSIS — R000 Tachycardia, unspecified: Secondary | ICD-10-CM

## 2012-03-30 LAB — CBC WITH DIFFERENTIAL/PLATELET
Basophils Absolute: 0.1 10*3/uL (ref 0.0–0.1)
Basophils Relative: 0.4 % (ref 0.0–3.0)
Eosinophils Absolute: 0.1 10*3/uL (ref 0.0–0.7)
Eosinophils Relative: 0.9 % (ref 0.0–5.0)
HCT: 45.3 % (ref 36.0–46.0)
Hemoglobin: 15.4 g/dL — ABNORMAL HIGH (ref 12.0–15.0)
Lymphocytes Relative: 20.7 % (ref 12.0–46.0)
Lymphs Abs: 2.6 10*3/uL (ref 0.7–4.0)
MCHC: 34 g/dL (ref 30.0–36.0)
MCV: 86.9 fl (ref 78.0–100.0)
Monocytes Absolute: 1 10*3/uL (ref 0.1–1.0)
Monocytes Relative: 7.9 % (ref 3.0–12.0)
Neutro Abs: 8.8 10*3/uL — ABNORMAL HIGH (ref 1.4–7.7)
Neutrophils Relative %: 70.1 % (ref 43.0–77.0)
Platelets: 264 10*3/uL (ref 150.0–400.0)
RBC: 5.22 Mil/uL — ABNORMAL HIGH (ref 3.87–5.11)
RDW: 13.6 % (ref 11.5–14.6)
WBC: 12.5 10*3/uL — ABNORMAL HIGH (ref 4.5–10.5)

## 2012-03-30 LAB — BASIC METABOLIC PANEL
BUN: 11 mg/dL (ref 6–23)
CO2: 32 mEq/L (ref 19–32)
Calcium: 9.5 mg/dL (ref 8.4–10.5)
Chloride: 100 mEq/L (ref 96–112)
Creatinine, Ser: 0.7 mg/dL (ref 0.4–1.2)
GFR: 92.14 mL/min (ref >60.00)
Glucose, Bld: 97 mg/dL (ref 70–99)
Potassium: 3.8 mEq/L (ref 3.5–5.1)
Sodium: 137 mEq/L (ref 135–145)

## 2012-03-30 LAB — HEPATIC FUNCTION PANEL
ALT: 30 U/L (ref 0–35)
AST: 25 U/L (ref 0–37)
Albumin: 4.1 g/dL (ref 3.5–5.2)
Alkaline Phosphatase: 50 U/L (ref 39–117)
Bilirubin, Direct: 0.1 mg/dL (ref 0.0–0.3)
Total Bilirubin: 0.7 mg/dL (ref 0.3–1.2)
Total Protein: 7.5 g/dL (ref 6.0–8.3)

## 2012-03-30 LAB — TSH: TSH: 2.45 u[IU]/mL (ref 0.35–5.50)

## 2012-03-30 MED ORDER — DULOXETINE HCL 30 MG PO CPEP: 30.0000 mg | ORAL_CAPSULE | Freq: Every day | ORAL | Status: DC

## 2012-03-30 NOTE — Assessment & Plan Note (Signed)
Prior EP cards eval 2006 for same - unremarkable, but highly symptomatic  On beta-blocker for same Advised on max target HR for age as she increases aerobic activity -  to call if HR persisting above this target or if symptomatic

## 2012-03-30 NOTE — Progress Notes (Signed)
Subjective:    Patient ID: [name] [name], female    DOB: Aug 27, 1960, 52 y.o.   MRN: [id number]  HPI  New to me but known to our practice (change PCP because I provide care to her parents)  Here for new/increase in depression symptoms  Ongoing >74mo, but progressively worse in past 3 months associated with hypersomnia, feeling sad, uncontrolled tearful spells Increase nightmares and feelings of worthlessness/apathy Admits to SI but understands those around her would be hurt if any action committed and denies intent to harm self or others  Past Medical History  Diagnosis Date  . Depression   . Anxiety   . Hypertension   . IBS (irritable bowel syndrome)   . Migraine, unspecified, without mention of intractable migraine without mention of status migrainosus   . Tachycardia, unspecified   . Excessive or frequent menstruation   . Brachial neuritis or radiculitis NOS    Family History  Problem Relation Age of Onset  . Cancer Mother     breast cancer-left mastectomy  . Asthma Mother   . Alzheimer's disease Mother   . Transient ischemic attack Mother   . Hypertension Father   . Hyperlipidemia Father   . Diabetes Father   . Coronary artery disease Father   . Heart failure Father     CABG  . Pancreatitis Father   . Alzheimer's disease Maternal Grandmother    History  Substance Use Topics  . Smoking status: Never Smoker   . Smokeless tobacco: Never Used  . Alcohol Use: Yes   Review of Systems Constitutional: Negative for fever or weight change.  Respiratory: Negative for cough and shortness of breath.   Cardiovascular: Negative for chest pain or palpitations.  Gastrointestinal: Negative for abdominal pain, no bowel changes.  Musculoskeletal: Negative for gait problem or joint swelling.  Skin: Negative for rash.  Neurological: Negative for dizziness or headache.  No other specific complaints in a complete review of systems (except as listed in HPI above).     Objective:     Physical Exam BP 102/70  Pulse 67  Temp 98.3 F (36.8 C) (Oral)  Ht 5\' 6"  (1.676 m)  Wt 173 lb 12.8 oz (78.835 kg)  BMI 28.05 kg/m2  SpO2 98% Wt Readings from Last 3 Encounters:  03/30/12 173 lb 12.8 oz (78.835 kg)  12/25/10 159 lb (72.122 kg)  08/03/09 161 lb (73.029 kg)   Constitutional: She appears well-developed and well-nourished. Mod emotional distress due to depression.  HENT: Head: Normocephalic and atraumatic. Ears: B TMs ok, no erythema or effusion; Nose: Nose normal. Mouth/Throat: Oropharynx is clear and moist. No oropharyngeal exudate.  Eyes: Conjunctivae and EOM are normal. Pupils are equal, round, and reactive to light. No scleral icterus.  Neck: Normal range of motion. Neck supple. No JVD present. No thyromegaly present.  Cardiovascular: Normal rate, regular rhythm and normal heart sounds.  No murmur heard. No BLE edema. Pulmonary/Chest: Effort normal and breath sounds normal. No respiratory distress. She has no wheezes.  Abdominal: Soft. Bowel sounds are normal. She exhibits no distension. There is no tenderness. no masses Musculoskeletal: Normal range of motion, no joint effusions. No gross deformities Neurological: She is alert and oriented to person, place, and time. No cranial nerve deficit. Coordination normal.  Skin: Skin is warm and dry. No rash noted. No erythema.  Psychiatric: She has a depressed mood and tearful affect. Her behavior is normal. Judgment and thought content normal.   Lab Results  Component Value Date  WBC 6.9 08/03/2009   HGB 14.3 08/03/2009   HCT 41.7 08/03/2009   PLT 283.0 08/03/2009   GLUCOSE 75 08/03/2009   CHOL 188 08/03/2009   TRIG 144.0 08/03/2009   HDL 48.60 08/03/2009   LDLCALC 111* 08/03/2009   ALT 26 12/14/2007   AST 22 12/14/2007   NA 137 08/03/2009   K 4.1 08/03/2009   CL 98 08/03/2009   CREATININE 0.6 08/03/2009   BUN 14 08/03/2009   CO2 33* 08/03/2009       Assessment & Plan:   See problem list. Medications and labs reviewed  today.  Time spent with pt today 42 minutes, greater than 50% time spent counseling patient on depression and medication review. Also review of prior records

## 2012-03-30 NOTE — Assessment & Plan Note (Signed)
BP Readings from Last 3 Encounters:  03/30/12 102/70  12/25/10 104/62  08/03/09 98/62   The current medical regimen is effective;  continue present plan and medications.

## 2012-03-30 NOTE — Patient Instructions (Signed)
It was good to see you today. We have reviewed your prior records including labs and tests today Test(s) ordered today. Your results will be released to MyChart (or called to you) after review, usually within 72hours after test completion. If any changes need to be made, you will be notified at that same time. Stop Paxil Start cymbalta for depression symptoms - Your prescription(s) have been submitted to your pharmacy. Please take as directed and contact our office if you believe you are having problem(s) with the medication(s). Please schedule followup in 2-4 weeks for symptoms review and medication recheck, call sooner if problems.

## 2012-03-30 NOTE — Assessment & Plan Note (Signed)
Increase in major depression symptoms over past 12 months Uncontrolled with current SSRI - on paxil since 2009 without change Stop paxil and start SNRI - cymbalta we reviewed potential risk/benefit and possible side effects - pt understands and agrees to same  Recheck in 2-4 weeks -  verified no si/hi at this time - pt agrees to call here or 911 if increase in SI symptoms or plan emerges Advised counseling and pt declines time/$ for same at this time Extensive emotional support offered today

## 2012-03-31 ENCOUNTER — Other Ambulatory Visit: Payer: Self-pay | Admitting: *Deleted

## 2012-03-31 NOTE — Telephone Encounter (Signed)
Received fax pt needing PA on her cymbalta. Called insurance they fax over form & it has been completed and fax back. Waiting on approval status...Raechel Chute

## 2012-04-01 NOTE — Telephone Encounter (Signed)
Received PA back med has been approve. Notified pharmacy with approval status...lmb 

## 2012-04-19 ENCOUNTER — Inpatient Hospital Stay: Payer: Self-pay | Admitting: Psychiatry

## 2012-04-19 ENCOUNTER — Encounter (HOSPITAL_COMMUNITY): Payer: Self-pay | Admitting: Emergency Medicine

## 2012-04-19 ENCOUNTER — Emergency Department (HOSPITAL_COMMUNITY)
Admission: EM | Admit: 2012-04-19 | Discharge: 2012-04-19 | Disposition: A | Discharge status: Other Acute Inpatient Hospital | Payer: Commercial Managed Care - PPO | Attending: Emergency Medicine | Admitting: Emergency Medicine

## 2012-04-19 DIAGNOSIS — T50901A Poisoning by unspecified drugs, medicaments and biological substances, accidental (unintentional), initial encounter: Secondary | ICD-10-CM

## 2012-04-19 DIAGNOSIS — R45851 Suicidal ideations: Secondary | ICD-10-CM | POA: Insufficient documentation

## 2012-04-19 DIAGNOSIS — I10 Essential (primary) hypertension: Secondary | ICD-10-CM | POA: Insufficient documentation

## 2012-04-19 DIAGNOSIS — Z79899 Other long term (current) drug therapy: Secondary | ICD-10-CM | POA: Insufficient documentation

## 2012-04-19 DIAGNOSIS — F3289 Other specified depressive episodes: Secondary | ICD-10-CM | POA: Insufficient documentation

## 2012-04-19 DIAGNOSIS — R42 Dizziness and giddiness: Secondary | ICD-10-CM | POA: Insufficient documentation

## 2012-04-19 DIAGNOSIS — Z8679 Personal history of other diseases of the circulatory system: Secondary | ICD-10-CM | POA: Insufficient documentation

## 2012-04-19 DIAGNOSIS — F329 Major depressive disorder, single episode, unspecified: Secondary | ICD-10-CM | POA: Insufficient documentation

## 2012-04-19 DIAGNOSIS — T426X1A Poisoning by other antiepileptic and sedative-hypnotic drugs, accidental (unintentional), initial encounter: Secondary | ICD-10-CM | POA: Insufficient documentation

## 2012-04-19 DIAGNOSIS — H53149 Visual discomfort, unspecified: Secondary | ICD-10-CM | POA: Insufficient documentation

## 2012-04-19 DIAGNOSIS — F411 Generalized anxiety disorder: Secondary | ICD-10-CM | POA: Insufficient documentation

## 2012-04-19 DIAGNOSIS — R110 Nausea: Secondary | ICD-10-CM | POA: Insufficient documentation

## 2012-04-19 DIAGNOSIS — Z8719 Personal history of other diseases of the digestive system: Secondary | ICD-10-CM | POA: Insufficient documentation

## 2012-04-19 DIAGNOSIS — T50992A Poisoning by other drugs, medicaments and biological substances, intentional self-harm, initial encounter: Secondary | ICD-10-CM | POA: Insufficient documentation

## 2012-04-19 LAB — COMPREHENSIVE METABOLIC PANEL
ALT: 22 U/L (ref 0–35)
AST: 21 U/L (ref 0–37)
Albumin: 3.9 g/dL (ref 3.5–5.2)
Alkaline Phosphatase: 62 U/L (ref 39–117)
BUN: 12 mg/dL (ref 6–23)
CO2: 26 mEq/L (ref 19–32)
Calcium: 9.4 mg/dL (ref 8.4–10.5)
Chloride: 99 mEq/L (ref 96–112)
Creatinine, Ser: 0.59 mg/dL (ref 0.50–1.10)
GFR calc Af Amer: >90 mL/min (ref >90)
GFR calc non Af Amer: >90 mL/min (ref >90)
Glucose, Bld: 124 mg/dL — ABNORMAL HIGH (ref 70–99)
Potassium: 2.8 mEq/L — ABNORMAL LOW (ref 3.5–5.1)
Sodium: 136 mEq/L (ref 135–145)
Total Bilirubin: 0.6 mg/dL (ref 0.3–1.2)
Total Protein: 7.5 g/dL (ref 6.0–8.3)

## 2012-04-19 LAB — CBC
HCT: 44.3 % (ref 36.0–46.0)
Hemoglobin: 15.2 g/dL — ABNORMAL HIGH (ref 12.0–15.0)
MCH: 29.3 pg (ref 26.0–34.0)
MCHC: 34.3 g/dL (ref 30.0–36.0)
MCV: 85.5 fL (ref 78.0–100.0)
Platelets: 225 10*3/uL (ref 150–400)
RBC: 5.18 MIL/uL — ABNORMAL HIGH (ref 3.87–5.11)
RDW: 13.2 % (ref 11.5–15.5)
WBC: 8.2 10*3/uL (ref 4.0–10.5)

## 2012-04-19 LAB — CARBAMAZEPINE LEVEL, TOTAL
Carbamazepine Lvl: 15.1 ug/mL (ref 4.0–12.0)
Carbamazepine: 13.9 ug/mL (ref 4.0–12.0)

## 2012-04-19 LAB — RAPID URINE DRUG SCREEN, HOSP PERFORMED
Amphetamines: NOT DETECTED
Barbiturates: NOT DETECTED
Benzodiazepines: NOT DETECTED
Cocaine: NOT DETECTED
Opiates: NOT DETECTED
Tetrahydrocannabinol: NOT DETECTED

## 2012-04-19 LAB — SALICYLATE LEVEL: Salicylate Lvl: <2 mg/dL — ABNORMAL LOW (ref 2.8–20.0)

## 2012-04-19 LAB — POCT I-STAT, CHEM 8
BUN: 12 mg/dL (ref 6–23)
Calcium, Ion: 1.18 mmol/L (ref 1.12–1.23)
Chloride: 103 mEq/L (ref 96–112)
Creatinine, Ser: 0.8 mg/dL (ref 0.50–1.10)
Glucose, Bld: 123 mg/dL — ABNORMAL HIGH (ref 70–99)
HCT: 43 % (ref 36.0–46.0)
Hemoglobin: 14.6 g/dL (ref 12.0–15.0)
Potassium: 3.1 mEq/L — ABNORMAL LOW (ref 3.5–5.1)
Sodium: 141 mEq/L (ref 135–145)
TCO2: 28 mmol/L (ref 0–100)

## 2012-04-19 LAB — ACETAMINOPHEN LEVEL: Acetaminophen (Tylenol), Serum: <15 ug/mL (ref 10–30)

## 2012-04-19 LAB — ETHANOL: Alcohol, Ethyl (B): <11 mg/dL (ref 0–11)

## 2012-04-19 MED ORDER — LORAZEPAM 1 MG PO TABS
1.0000 mg | ORAL_TABLET | Freq: Once | ORAL | Status: AC
  Administered 2012-04-19: 1 mg via ORAL
  Filled 2012-04-19: qty 1

## 2012-04-19 MED ORDER — ACETAMINOPHEN 325 MG PO TABS: 650.0000 mg | ORAL_TABLET | ORAL | Status: DC | PRN

## 2012-04-19 MED ORDER — NICOTINE 21 MG/24HR TD PT24: 21.0000 mg | MEDICATED_PATCH | Freq: Every day | TRANSDERMAL | Status: DC

## 2012-04-19 MED ORDER — LORAZEPAM 1 MG PO TABS: 1.0000 mg | ORAL_TABLET | Freq: Three times a day (TID) | ORAL | Status: DC | PRN

## 2012-04-19 MED ORDER — ONDANSETRON HCL 8 MG PO TABS: 4.0000 mg | ORAL_TABLET | Freq: Three times a day (TID) | ORAL | Status: DC | PRN

## 2012-04-19 MED ORDER — POTASSIUM CHLORIDE CRYS ER 20 MEQ PO TBCR
40.0000 meq | EXTENDED_RELEASE_TABLET | Freq: Once | ORAL | Status: AC
  Administered 2012-04-19: 40 meq via ORAL
  Filled 2012-04-19: qty 2

## 2012-04-19 MED ORDER — POTASSIUM CHLORIDE CRYS ER 20 MEQ PO TBCR
60.0000 meq | EXTENDED_RELEASE_TABLET | Freq: Once | ORAL | Status: AC
  Administered 2012-04-19: 60 meq via ORAL
  Filled 2012-04-19: qty 3

## 2012-04-19 MED ORDER — ZOLPIDEM TARTRATE 5 MG PO TABS: 5.0000 mg | ORAL_TABLET | Freq: Every evening | ORAL | Status: DC | PRN

## 2012-04-19 MED ORDER — IBUPROFEN 200 MG PO TABS: 600.0000 mg | ORAL_TABLET | Freq: Three times a day (TID) | ORAL | Status: DC | PRN

## 2012-04-19 MED ORDER — ALUM & MAG HYDROXIDE-SIMETH 200-200-20 MG/5ML PO SUSP: 30.0000 mL | ORAL | Status: DC | PRN

## 2012-04-19 NOTE — BHH Counselor (Signed)
Spoke with Larita Fife at [facility] provided the needed information on medication; pt will need to be IVC; will need to call back @ [phone] once pt is on her way; Pt accepted by Dr. Guss Bunde

## 2012-04-19 NOTE — ED Notes (Addendum)
Poison Control contacted to report PT overdose. Care Plan for suggested per Poison control is to monitor PT ,recheck  Carbamazepine level in 6 hours and provide IV fluids for hydration. EDP made aware .

## 2012-04-19 NOTE — BH Assessment (Addendum)
Assessment Note   [name] [name] is an 52 y.o. female.  Pt states that she was brought into the ED after an SI attempt by using medication; Pt states that she feels as though her life is falling apart and that it will not improve; pt states that for the past 18 months she has constantly been running taking care of her elderly parents, husband that has physical problems and conflict on her job; pt states that she cannot find any peace within her situation; she states that her family being parents and husband are very supportive, however they depend on her for a lot and she does not feel that she can continue going; pt states that depression does run within her family, therefore she did seek assistance; states that she thought she was doing well on cymbalta , but things fell apart last night; she feels that everything that she says at work is turned around on her; pt states that she is currently SI and has a plan with medication; pt was very tearful throughout the entire assessment; when questioned pt about hopeless she stated that she does not see her life improving at this point; pt has no history of SA; states that she has a sister that is dealing with her own issues at this time; NO HI; or AVH; pt states that she has never been a victim of abuse; nor has she recd any outpatient or inpatient care; pt states that she wants to be able to deal with life and take care of her family; [facility] @ 1355 spoke with Ros[facility] no beds; Called OV and spoke with Adrienne at 1356 no female beds; Spoke with Larita Fife at [place] who explained that a referral could be faxed; will referr also to [facility]  Axis I: Mood Disorder NOS Axis II: Deferred Axis III:  Past Medical History  Diagnosis Date  . Depression   . Anxiety   . Hypertension   . IBS (irritable bowel syndrome)   . Migraine, unspecified, without mention of intractable migraine without mention of status migrainosus   . Tachycardia, unspecified     on  BBloc   Axis IV: occupational problems, problems related to social environment and problems with primary support group Axis V: 21-30 behavior considerably influenced by delusions or hallucinations OR serious impairment in judgment, communication OR inability to function in almost all areas  Past Medical History:  Past Medical History  Diagnosis Date  . Depression   . Anxiety   . Hypertension   . IBS (irritable bowel syndrome)   . Migraine, unspecified, without mention of intractable migraine without mention of status migrainosus   . Tachycardia, unspecified     on BBloc    Past Surgical History  Procedure Date  . Tubal ligation   . Bilateral myofascial release for recurrent plantar faciitis     Family History:  Family History  Problem Relation Age of Onset  . Cancer Mother     breast cancer-left mastectomy  . Asthma Mother   . Alzheimer's disease Mother   . Transient ischemic attack Mother   . Hypertension Father   . Hyperlipidemia Father   . Diabetes Father   . Coronary artery disease Father   . Heart failure Father     CABG  . Pancreatitis Father   . Alzheimer's disease Maternal Grandmother     Social History:  reports that she has never smoked. She has never used smokeless tobacco. She reports that she drinks alcohol. She  reports that she does not use illicit drugs.  Additional Social History:     CIWA: CIWA-Ar BP: 142/70 mmHg Pulse Rate: 99  COWS:    Allergies:  Allergies  Allergen Reactions  . Codeine     REACTION: causes nausea and vomiting    Home Medications:  (Not in a hospital admission)  OB/GYN Status:  No LMP recorded.  General Assessment Data Location of Assessment: [facility] ED ACT Assessment: Yes Living Arrangements: Spouse/significant other Can pt return to current living arrangement?: Yes Admission Status: Voluntary Is patient capable of signing voluntary admission?: Yes Transfer from: Acute Hospital Referral Source:  Self/Family/Friend     Risk to self Suicidal Ideation: Yes-Currently Present Suicidal Intent: Yes-Currently Present Is patient at risk for suicide?: Yes Suicidal Plan?: Yes-Currently Present Specify Current Suicidal Plan: overdose on medication Access to Means: Yes Specify Access to Suicidal Means: variety of medication within home or with parents What has been your use of drugs/alcohol within the last 12 months?: none Previous Attempts/Gestures: No How many times?: 0  Other Self Harm Risks: 0 Triggers for Past Attempts: None known Intentional Self Injurious Behavior: None Family Suicide History: No Recent stressful life event(s): Other (Comment) (taking care of parents, husband and conflict at work) Persecutory voices/beliefs?: No Depression: Yes Depression Symptoms: Tearfulness;Fatigue;Insomnia Substance abuse history and/or treatment for substance abuse?: No Suicide prevention information given to non-admitted patients: Not applicable  Risk to Others Homicidal Ideation: No Thoughts of Harm to Others: No Current Homicidal Intent: No Current Homicidal Plan: No Access to Homicidal Means: No Identified Victim: None History of harm to others?: No Assessment of Violence: None Noted Violent Behavior Description: pt very tearful but cooperative during assessment Does patient have access to weapons?: No Criminal Charges Pending?: No Does patient have a court date: No  Psychosis Hallucinations: None noted Delusions: None noted  Mental Status Report Appear/Hygiene: Other (Comment) (within normal limits) Eye Contact: Fair Motor Activity: Freedom of movement Speech: Logical/coherent;Soft Level of Consciousness: Alert;Crying Mood: Depressed;Sad Affect: Depressed;Sad Anxiety Level: Minimal Thought Processes: Coherent;Relevant Judgement: Impaired Orientation: Person;Place;Time;Situation Obsessive Compulsive Thoughts/Behaviors: None  Cognitive Functioning Concentration:  Normal Memory: Recent Intact;Remote Intact IQ: Average Insight: Poor Impulse Control: Poor Appetite: Fair Weight Loss: 0  Weight Gain: 0  Sleep: No Change Total Hours of Sleep: 6  Vegetative Symptoms: None  ADLScreening [facility]) Patient's cognitive ability adequate to safely complete daily activities?: Yes Patient able to express need for assistance with ADLs?: Yes Independently performs ADLs?: Yes (appropriate for developmental age)  Abuse/Neglect [facility]) Physical Abuse: Denies Verbal Abuse: Denies Sexual Abuse: Denies  Prior Inpatient Therapy Prior Inpatient Therapy: No  Prior Outpatient Therapy Prior Outpatient Therapy: No  ADL Screening (condition at time of admission) Patient's cognitive ability adequate to safely complete daily activities?: Yes Patient able to express need for assistance with ADLs?: Yes Independently performs ADLs?: Yes (appropriate for developmental age)       Abuse/Neglect Assessment (Assessment to be complete while patient is alone) Physical Abuse: Denies Verbal Abuse: Denies Sexual Abuse: Denies Values / Beliefs Cultural Requests During Hospitalization: None Spiritual Requests During Hospitalization: None        Additional Information 1:1 In Past 12 Months?: No CIRT Risk: No Elopement Risk: No Does patient have medical clearance?: Yes     Disposition: referred to [facility]; contacted [facility] no female beds; [facility] no beds; sending referral to [place] Disposition Disposition of Patient: Referred to [facility]) Patient referred to: Other (Comment) [facility])  On Site Evaluation by:  Reviewed with Physician:     Earmon Phoenix 04/19/2012 1:45 PM

## 2012-04-19 NOTE — ED Notes (Signed)
Sitter arrived at bedside.

## 2012-04-19 NOTE — ED Notes (Signed)
Pt. s clothes and wallet given to pt.s husband.   He took belongings to the car

## 2012-04-19 NOTE — ED Notes (Signed)
Explained to family our protocol for suicide. Pt. Has been wanded and placed in scrubs

## 2012-04-19 NOTE — ED Notes (Signed)
Asked pt. Why she took a handful of medication, she stated, "I don't want to live anymore."  Pt. Is very tearful and weeping.  Pt.s husband is at the bedside.  When asked what type of pain she was having on Friday, she stated, "heart pain".

## 2012-04-19 NOTE — ED Provider Notes (Signed)
7:05 PM patient alert and with Glasgow Coma Score 15. Stable for transport to psychiatric hospital  Doug Sou, MD 04/19/12 1911

## 2012-04-19 NOTE — ED Notes (Signed)
Pt states she is experiencing double vision and asked for medication to alleviate double vision.  Pt informed that no medication exists to treat double vision. Also informed pt to notify RN if nausea and/or vomiting begin and will give zofran. Pt then stated, "I guess I took the wrong medication."

## 2012-04-19 NOTE — ED Provider Notes (Signed)
History     CSN: [id number]  Arrival date & time 04/19/12  1478   First MD Initiated Contact with Patient 04/19/12 [phone]      Chief Complaint  Patient presents with  . Drug Overdose    (Consider location/radiation/quality/duration/timing/severity/associated sxs/prior treatment) Patient is a 52 y.o. female presenting with Overdose. The history is provided by the patient.  Drug Overdose   patient here after an intentional overdose of Tegretol over the past 2 days. Notes some dizziness and light sensitivity. States that this was a suicide attempt. Denies any auditory or visual hallucinations. Does have a history of anxiety and takes Xanax when necessary. Also history of depression. Denies any prior history of suicide attempt and does not know what caused her to take the dose currently. Denies any recent fever headache or neck pain. No recent alcohol or illicit drug use.  Past Medical History  Diagnosis Date  . Depression   . Anxiety   . Hypertension   . IBS (irritable bowel syndrome)   . Migraine, unspecified, without mention of intractable migraine without mention of status migrainosus   . Tachycardia, unspecified     on BBloc    Past Surgical History  Procedure Date  . Tubal ligation   . Bilateral myofascial release for recurrent plantar faciitis     Family History  Problem Relation Age of Onset  . Cancer Mother     breast cancer-left mastectomy  . Asthma Mother   . Alzheimer's disease Mother   . Transient ischemic attack Mother   . Hypertension Father   . Hyperlipidemia Father   . Diabetes Father   . Coronary artery disease Father   . Heart failure Father     CABG  . Pancreatitis Father   . Alzheimer's disease Maternal Grandmother     History  Substance Use Topics  . Smoking status: Never Smoker   . Smokeless tobacco: Never Used  . Alcohol Use: Yes    OB History    Grav Para Term Preterm Abortions TAB SAB Ect Mult Living                  Review of  Systems  All other systems reviewed and are negative.    Allergies  Codeine  Home Medications   Current Outpatient Rx  Name  Route  Sig  Dispense  Refill  . DULOXETINE HCL 30 MG PO CPEP   Oral   Take 1 capsule (30 mg total) by mouth daily.   30 capsule   3   . ENALAPRIL-HYDROCHLOROTHIAZIDE 10-25 MG PO TABS   Oral   Take 1 tablet by mouth daily.   30 tablet   11   . METOPROLOL TARTRATE 25 MG PO TABS   Oral   Take 25 mg by mouth 2 (two) times daily.           BP 156/93  Pulse 125  Temp 97.8 F (36.6 C) (Oral)  Resp 22  SpO2 95%  Physical Exam  Nursing note and vitals reviewed. Constitutional: She is oriented to person, place, and time. She appears well-developed and well-nourished.  Non-toxic appearance. No distress.  HENT:  Head: Normocephalic and atraumatic.  Eyes: Conjunctivae normal, EOM and lids are normal. Pupils are equal, round, and reactive to light.  Neck: Normal range of motion. Neck supple. No tracheal deviation present. No mass present.  Cardiovascular: Normal rate, regular rhythm and normal heart sounds.  Exam reveals no gallop.   No murmur heard. Pulmonary/Chest: Effort  normal and breath sounds normal. No stridor. No respiratory distress. She has no decreased breath sounds. She has no wheezes. She has no rhonchi. She has no rales.  Abdominal: Soft. Normal appearance and bowel sounds are normal. She exhibits no distension. There is no tenderness. There is no rebound and no CVA tenderness.  Musculoskeletal: Normal range of motion. She exhibits no edema and no tenderness.  Neurological: She is alert and oriented to person, place, and time. She has normal strength. No cranial nerve deficit or sensory deficit. GCS eye subscore is 4. GCS verbal subscore is 5. GCS motor subscore is 6.  Skin: Skin is warm and dry. No abrasion and no rash noted.  Psychiatric: Her behavior is normal. Her affect is blunt. Her speech is delayed. She is not actively hallucinating.  She exhibits a depressed mood. She expresses suicidal ideation. She expresses suicidal plans.    ED Course  Procedures (including critical care time)   Labs Reviewed  CBC  COMPREHENSIVE METABOLIC PANEL  ETHANOL  ACETAMINOPHEN LEVEL  SALICYLATE LEVEL  URINE RAPID DRUG SCREEN (HOSP PERFORMED)   No results found.   No diagnosis found.    MDM   Date: 04/19/2012  Rate: 93  Rhythm: normal sinus rhythm  QRS Axis: normal  Intervals: normal  ST/T Wave abnormalities: nonspecific ST changes  Conduction Disutrbances:none  Narrative Interpretation:   Old EKG Reviewed: changes noted, increased pvcs  12:11 PM Patient given oral potassium for her mild hypokalemia. She has a known history of PVCs with bigeminy and that is a rhythm that she isn't currently. She is medically cleared and will be assessed by the behavior health team        Toy Baker, MD 04/19/12 1212

## 2012-04-19 NOTE — ED Notes (Signed)
Pt's husband: home [phone]. Cell [phone]

## 2012-04-19 NOTE — ED Notes (Signed)
Notified house coverage and Charge nurse

## 2012-04-19 NOTE — ED Notes (Signed)
Patient stated on Friday night took a handful of her medication tegretol on purpose.  Patient currently tearful in triage.  Since then having extreme nausea and triple vision with light sensitivity. Denies pain.  States SI is not the right word. Just did not want to feel pain any more.  Denies HI.

## 2012-04-20 LAB — BEHAVIORAL MEDICINE 1 PANEL
Albumin: 4.1 g/dL (ref 3.4–5.0)
Alkaline Phosphatase: 69 U/L (ref 50–136)
Anion Gap: 6 — ABNORMAL LOW (ref 7–16)
BUN: 11 mg/dL (ref 7–18)
Basophil #: 0.1 10*3/uL (ref 0.0–0.1)
Basophil %: 0.7 %
Bilirubin,Total: 0.5 mg/dL (ref 0.2–1.0)
Calcium, Total: 9.4 mg/dL (ref 8.5–10.1)
Chloride: 100 mmol/L (ref 98–107)
Co2: 29 mmol/L (ref 21–32)
Creatinine: 0.63 mg/dL (ref 0.60–1.30)
EGFR (African American): >60
EGFR (Non-African Amer.): >60
Eosinophil #: 0 10*3/uL (ref 0.0–0.7)
Eosinophil %: 0.1 %
Glucose: 108 mg/dL — ABNORMAL HIGH (ref 65–99)
HCT: 46.9 % (ref 35.0–47.0)
HGB: 15.9 g/dL (ref 12.0–16.0)
Lymphocyte #: 1.8 10*3/uL (ref 1.0–3.6)
Lymphocyte %: 11.9 %
MCH: 29.3 pg (ref 26.0–34.0)
MCHC: 33.9 g/dL (ref 32.0–36.0)
MCV: 86 fL (ref 80–100)
Monocyte #: 0.9 x10 3/mm (ref 0.2–0.9)
Monocyte %: 6.3 %
Neutrophil #: 11.9 10*3/uL — ABNORMAL HIGH (ref 1.4–6.5)
Neutrophil %: 81 %
Osmolality: 270 (ref 275–301)
Platelet: 279 10*3/uL (ref 150–440)
Potassium: 3.7 mmol/L (ref 3.5–5.1)
RBC: 5.43 10*6/uL — ABNORMAL HIGH (ref 3.80–5.20)
RDW: 13.9 % (ref 11.5–14.5)
SGOT(AST): 25 U/L (ref 15–37)
SGPT (ALT): 31 U/L (ref 12–78)
Sodium: 135 mmol/L — ABNORMAL LOW (ref 136–145)
Thyroid Stimulating Horm: 1.06 u[IU]/mL
Total Protein: 8.2 g/dL (ref 6.4–8.2)
WBC: 14.7 10*3/uL — ABNORMAL HIGH (ref 3.6–11.0)

## 2012-04-20 LAB — CARBAMAZEPINE LEVEL, TOTAL
Carbamazepine: 14.5 ug/mL (ref 4.0–12.0)
Carbamazepine: 13.7 ug/mL (ref 4.0–12.0)

## 2012-04-20 LAB — POTASSIUM: Potassium: 4 mmol/L (ref 3.5–5.1)

## 2012-04-22 LAB — CARBAMAZEPINE LEVEL, TOTAL: Carbamazepine: 6.5 ug/mL (ref 4.0–12.0)

## 2012-04-27 ENCOUNTER — Ambulatory Visit: Payer: Commercial Managed Care - PPO | Admitting: Internal Medicine

## 2012-06-02 ENCOUNTER — Other Ambulatory Visit: Payer: Self-pay

## 2012-06-02 MED ORDER — METOPROLOL TARTRATE 25 MG PO TABS: 25.0000 mg | ORAL_TABLET | Freq: Two times a day (BID) | ORAL | Status: DC

## 2012-07-29 ENCOUNTER — Encounter: Payer: Self-pay | Admitting: Internal Medicine

## 2012-07-29 ENCOUNTER — Ambulatory Visit (INDEPENDENT_AMBULATORY_CARE_PROVIDER_SITE_OTHER): Payer: Commercial Managed Care - PPO | Admitting: Internal Medicine

## 2012-07-29 VITALS — BP 114/80 | HR 66 | Temp 97.9°F | Wt 180.0 lb

## 2012-07-29 DIAGNOSIS — E663 Overweight: Secondary | ICD-10-CM | POA: Insufficient documentation

## 2012-07-29 DIAGNOSIS — R000 Tachycardia, unspecified: Secondary | ICD-10-CM

## 2012-07-29 DIAGNOSIS — F341 Dysthymic disorder: Secondary | ICD-10-CM

## 2012-07-29 MED ORDER — DULOXETINE HCL 60 MG PO CPEP: 60.0000 mg | ORAL_CAPSULE | Freq: Every day | ORAL | Status: DC

## 2012-07-29 NOTE — Patient Instructions (Signed)
It was good to see you today. We have reviewed your prior records including labs and tests today Medications reviewed and updated, no changes recommended at this time. Will refill as needed Will write letter for you to your gym as discussed and call wen ready for pick up Please schedule followup in 3-4 months, call sooner if problems.

## 2012-07-29 NOTE — Assessment & Plan Note (Signed)
Wt Readings from Last 3 Encounters:  07/29/12 180 lb (81.647 kg)  03/30/12 173 lb 12.8 oz (78.835 kg)  12/25/10 159 lb (72.122 kg)   The patient is asked to make an attempt to improve diet and exercise patterns to aid in medical management of this problem.

## 2012-07-29 NOTE — Progress Notes (Signed)
  Subjective:    Patient ID: [name] [name], female    DOB: 03/01/1961, 52 y.o.   MRN: [id number]  HPI  Here for follow up - reviewed interval events  Past Medical History  Diagnosis Date  . Depression   . Anxiety   . Hypertension   . IBS (irritable bowel syndrome)   . Migraine, unspecified, without mention of intractable migraine without mention of status migrainosus   . Tachycardia, unspecified     on BBloc    Review of Systems  Respiratory: Negative for cough and shortness of breath.   Cardiovascular: Negative for chest pain; + recurrent symptomatic palpitations with aerobic exercise (hx same s/p EP cards eval).  Gastrointestinal: Negative for abdominal pain, no bowel changes. Neurological: Negative for dizziness or headache.       Objective:   Physical Exam  BP 114/80  Pulse 66  Temp(Src) 97.9 F (36.6 C) (Oral)  Wt 180 lb (81.647 kg)  BMI 29.07 kg/m2  SpO2 97% Wt Readings from Last 3 Encounters:  07/29/12 180 lb (81.647 kg)  03/30/12 173 lb 12.8 oz (78.835 kg)  12/25/10 159 lb (72.122 kg)   Constitutional: She is overweight, but appears well-developed and well-nourished. No distress Neck: Normal range of motion. Neck supple. No JVD present. No thyromegaly present.  Cardiovascular: Normal rate, regular rhythm and normal heart sounds.  No murmur heard. No BLE edema. Pulmonary/Chest: Effort normal and breath sounds normal. No respiratory distress. She has no wheezes.   Psychiatric: She has a normal mood and affect. Her behavior is normal. Judgment and thought content normal.   Lab Results  Component Value Date   WBC 8.2 04/19/2012   HGB 14.6 04/19/2012   HCT 43.0 04/19/2012   PLT 225 04/19/2012   GLUCOSE 123* 04/19/2012   CHOL 188 08/03/2009   TRIG 144.0 08/03/2009   HDL 48.60 08/03/2009   LDLCALC 111* 08/03/2009   ALT 22 04/19/2012   AST 21 04/19/2012   NA 141 04/19/2012   K 3.1* 04/19/2012   CL 103 04/19/2012   CREATININE 0.80 04/19/2012   BUN 12 04/19/2012   CO2 26 04/19/2012    TSH 2.45 03/30/2012       Assessment & Plan:   See problem list. Medications and labs reviewed today.  Time spent with pt today 25 minutes, greater than 50% time spent counseling patient on depression, tachycardia symptoms and medication review. Also review of prior records

## 2012-07-29 NOTE — Assessment & Plan Note (Signed)
Prior EP cards eval 2006 for same - unremarkable, but highly symptomatic  On beta-blocker for same, but recurrent symptoms with aerobic effort Previously (03/2012) I advised on max target HR for age as she increased aerobic activity - doing so not helpful with symptomatic control Therefore, I feel she should not participate in gym membership at this time due to her inability to tolerate the tachycardia associated with with aerobic exertion - will generate letter stating she should be released from gym contract due to her medical condition -  follow up cards as needed and continue beta-blocker

## 2012-07-29 NOTE — Assessment & Plan Note (Signed)
hx major depression - intentional OD and sicide gesture 04/19/12 reviewed - 7d IP stay at [facility] following same - reports improved Also s/p psyc eval/tx Evelene Croon) - on increase dose Cymbalta (prev uncontrolled with paxil -on same since 2009 -03/2012 when changed to cymbalta)  Currently stable, The current medical regimen is effective;  continue present plan and medications.  verified no si/hi at this time - pt agrees to call here or 911 if recurrent SI symptoms Extensive emotional support offered today

## 2012-07-30 ENCOUNTER — Encounter: Payer: Self-pay | Admitting: Internal Medicine

## 2012-08-04 ENCOUNTER — Other Ambulatory Visit: Payer: Self-pay

## 2012-08-04 MED ORDER — METOPROLOL TARTRATE 25 MG PO TABS: 25.0000 mg | ORAL_TABLET | Freq: Two times a day (BID) | ORAL | Status: DC

## 2012-09-23 ENCOUNTER — Other Ambulatory Visit: Payer: Self-pay | Admitting: *Deleted

## 2012-09-23 MED ORDER — DULOXETINE HCL 60 MG PO CPEP: 60.0000 mg | ORAL_CAPSULE | Freq: Every day | ORAL | Status: DC

## 2013-01-10 ENCOUNTER — Other Ambulatory Visit: Payer: Self-pay | Admitting: Internal Medicine

## 2013-01-14 ENCOUNTER — Telehealth: Payer: Self-pay | Admitting: Internal Medicine

## 2013-01-14 MED ORDER — ENALAPRIL-HYDROCHLOROTHIAZIDE 10-25 MG PO TABS: 1.0000 | ORAL_TABLET | Freq: Every day | ORAL | Status: DC

## 2013-01-14 NOTE — Telephone Encounter (Signed)
Called pt spoke with husband inform him refills was sent to [facility]...lmb

## 2013-01-14 NOTE — Telephone Encounter (Signed)
01/14/2013  Pt called in requesting a refill on RX enalapril-hydrochlorothiazide (VASERETIC) 10-25 MG per tablet.  Pt is taking last pill today.

## 2013-01-21 ENCOUNTER — Other Ambulatory Visit: Payer: Self-pay

## 2013-07-09 ENCOUNTER — Other Ambulatory Visit: Payer: Self-pay | Admitting: Internal Medicine

## 2013-07-23 ENCOUNTER — Ambulatory Visit: Payer: Commercial Managed Care - PPO | Admitting: Internal Medicine

## 2013-07-26 ENCOUNTER — Other Ambulatory Visit: Payer: Self-pay | Admitting: Internal Medicine

## 2013-09-20 ENCOUNTER — Other Ambulatory Visit: Payer: Self-pay | Admitting: Internal Medicine

## 2013-10-09 ENCOUNTER — Ambulatory Visit (INDEPENDENT_AMBULATORY_CARE_PROVIDER_SITE_OTHER): Payer: Commercial Managed Care - PPO | Admitting: Internal Medicine

## 2013-10-09 ENCOUNTER — Encounter: Payer: Self-pay | Admitting: Internal Medicine

## 2013-10-09 VITALS — BP 124/92 | HR 75 | Temp 97.9°F | Wt 191.0 lb

## 2013-10-09 DIAGNOSIS — R0789 Other chest pain: Secondary | ICD-10-CM

## 2013-10-09 NOTE — Progress Notes (Signed)
Pre visit review using our clinic review tool, if applicable. No additional management support is needed unless otherwise documented below in the visit note. 

## 2013-10-09 NOTE — Progress Notes (Signed)
Subjective:    Patient ID: [name] [name], female    DOB: August 28, 1960, 53 y.o.   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 (wrist cuff) Finally the feeling ebbed away and she stayed at work (did take dose of her meds from husband also) Didn't feel much better all day--- went home and lied down after lunch  Worried she had some type of "event" Had noticed the night before that her bigeminy was worse Some pressure and tightness in chest during event No true palpitations during event Some dyspnea during event No diaphoresis  Feels okay today--was just lethargic but then back to herself eventually No dizziness or syncope  Had her usual breakfast and 1 cup of coffee yesterday AM  Current Outpatient Prescriptions on File Prior to Visit  Medication Sig Dispense Refill  . DULoxetine (CYMBALTA) 60 MG capsule TAKE 1 CAPSULE DAILY  30 capsule  0  . enalapril-hydrochlorothiazide (VASERETIC) 10-25 MG per tablet TAKE 1 TABLET BY MOUTH DAILY  90 tablet  0  . metoprolol tartrate (LOPRESSOR) 25 MG tablet TAKE 1 TABLET BY MOUTH TWICE DAILY  180 tablet  0   No current facility-administered medications on file prior to visit.    Allergies  Allergen Reactions  . Codeine     REACTION: causes nausea and vomiting    Past Medical History  Diagnosis Date  . Depression   . Anxiety   . Hypertension   . IBS (irritable bowel syndrome)   . Migraine, unspecified, without mention of intractable migraine without mention of status migrainosus   . Tachycardia, unspecified     on BBloc    Past Surgical History    Procedure Laterality Date  . Tubal ligation    . Bilateral myofascial release for recurrent plantar faciitis      Family History  Problem Relation Age of Onset  . Cancer Mother     breast cancer-left mastectomy  . Asthma Mother   . Alzheimer's disease Mother   . Transient ischemic attack Mother   . Hypertension Father   . Hyperlipidemia Father   . Diabetes Father   . Coronary artery disease Father   . Heart failure Father     CABG  . Pancreatitis Father   . Alzheimer's disease Maternal Grandmother     History   Social History  . Marital Status: Married    Spouse Name: N/A    Number of Children: 0  . Years of Education: 13   Occupational History  . sales    Social History Main Topics  . Smoking status: Never Smoker   . Smokeless tobacco: Never Used  . Alcohol Use: Yes  . Drug Use: No  . Sexual Activity: Yes    Partners: Male   Other Topics Concern  . Not on file   Social History Narrative   HSG. 1 year [employer]. Married '86-divorced after 58yrs; remarried  '96. No children, 2 step children. 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 Marriage is stable.   Review of Systems No fever  No cough No congestion or URI symptoms No diarrhea or vomiting Did have problems speaking properly yesterday-- saying words backwards. No focal weakness, facial droop, etc Has missed her first period--?perimenopausal    Objective:   Physical Exam  Constitutional: She appears well-developed and well-nourished. No distress.  BBP 156/88 on right  Neck: Normal range of motion. Neck supple. No thyromegaly present.  Cardiovascular: Normal rate, regular rhythm, normal heart sounds and intact distal pulses.  Exam reveals no gallop.   No murmur heard. Pulmonary/Chest: Effort normal and breath sounds normal. No respiratory distress. She has no wheezes. She has no rales.  Abdominal: Soft. There is no tenderness.  Musculoskeletal: She  exhibits no edema and no tenderness.  Lymphadenopathy:    She has no cervical adenopathy.  Psychiatric: She has a normal mood and affect. Her behavior is normal.  Appropriate interaction Not depressed or anxious today          Assessment & Plan:

## 2013-10-09 NOTE — Assessment & Plan Note (Addendum)
1 day of noted DOE then 45 minute spell yesterday I don't believe her BP was 211/170 and it is not clear whether the BP was up due to the event, or the event due to elevated BP No apparent spell of tachycardia--but certainly possible I doubt coronary ischemia---EKG is normal except for bradycardia now Cymbalta can increase BP--but it is not that high now  Will send note to Dr Asa Lente Certainly if there is ongoing DOE or any type of event, consideration should be made for exercise stress testing or cardiology evaluation. No further action for now though

## 2013-10-11 ENCOUNTER — Telehealth: Payer: Self-pay | Admitting: *Deleted

## 2013-10-11 NOTE — Telephone Encounter (Signed)
[facility] Triage Call Report Triage Record Num: [id number] Operator: Flonnie Hailstone Patient Name: [name] [name] Call Date & Time: 10/08/2013 4:59:37PM Patient Phone: [phone] PCP: Gwendolyn Grant Patient Gender: Female PCP Fax : [phone] Patient DOB: 03/31/1960 Practice Name: [facility] Reason for Call: Caller: Ahria/Patient; PCP: Gwendolyn Grant (Adults only); CB#: [phone]; Noticed breathing felt "labored" walking to car 7-23. Developed nausea/headache/ 7-24 and checked blood pressure and was 211/165. States is feeling better at time of call. States last pressure was 111/70. Forgot medicine and took after saw pressure was 211/165. Continues to have breathing that is "not usual". States feels "winded". Per Hypertension protocol, advised UC due to new onset breathing problems. Protocol(s) Used: Hypertension, Diagnosed or Suspected Recommended Outcome per Protocol: See ED Immediately Reason for Outcome: New onset of breathing problems Care Advice: ~ 07/

## 2013-10-13 ENCOUNTER — Ambulatory Visit (INDEPENDENT_AMBULATORY_CARE_PROVIDER_SITE_OTHER): Payer: Commercial Managed Care - PPO | Admitting: Internal Medicine

## 2013-10-13 ENCOUNTER — Other Ambulatory Visit (INDEPENDENT_AMBULATORY_CARE_PROVIDER_SITE_OTHER): Payer: Commercial Managed Care - PPO

## 2013-10-13 ENCOUNTER — Encounter: Payer: Self-pay | Admitting: Internal Medicine

## 2013-10-13 VITALS — BP 100/70 | HR 87 | Temp 98.3°F | Wt 194.8 lb

## 2013-10-13 DIAGNOSIS — R079 Chest pain, unspecified: Secondary | ICD-10-CM

## 2013-10-13 DIAGNOSIS — I10 Essential (primary) hypertension: Secondary | ICD-10-CM

## 2013-10-13 DIAGNOSIS — R51 Headache: Secondary | ICD-10-CM

## 2013-10-13 LAB — CBC WITH DIFFERENTIAL/PLATELET
Basophils Absolute: 0 10*3/uL (ref 0.0–0.1)
Basophils Relative: 0.3 % (ref 0.0–3.0)
Eosinophils Absolute: 0.2 10*3/uL (ref 0.0–0.7)
Eosinophils Relative: 2.3 % (ref 0.0–5.0)
HCT: 40.9 % (ref 36.0–46.0)
Hemoglobin: 13.9 g/dL (ref 12.0–15.0)
Lymphocytes Relative: 23 % (ref 12.0–46.0)
Lymphs Abs: 2.2 10*3/uL (ref 0.7–4.0)
MCHC: 33.9 g/dL (ref 30.0–36.0)
MCV: 84.7 fl (ref 78.0–100.0)
Monocytes Absolute: 1.2 10*3/uL — ABNORMAL HIGH (ref 0.1–1.0)
Monocytes Relative: 12.6 % — ABNORMAL HIGH (ref 3.0–12.0)
Neutro Abs: 5.9 10*3/uL (ref 1.4–7.7)
Neutrophils Relative %: 61.8 % (ref 43.0–77.0)
Platelets: 278 10*3/uL (ref 150.0–400.0)
RBC: 4.83 Mil/uL (ref 3.87–5.11)
RDW: 13.6 % (ref 11.5–15.5)
WBC: 9.6 10*3/uL (ref 4.0–10.5)

## 2013-10-13 NOTE — Progress Notes (Signed)
   Subjective:    Patient ID: [name] [name], female    DOB: Apr 24, 1960, 53 y.o.   MRN: [id number]  HPI   Her symptoms began 10/08/13 when she felt she had a "event" midmorning lasting roughly 45 minutes. This was described as nausea associated with L  chest heaviness and tightness in the left arm. She also had headache and felt that her veins in her neck were throbbing. She rested and took her blood pressure medications.  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 Her blood pressure recorded ranges were  165/120-211/165. This was measured by an automatic wrist cuff.  She was seen 10/09/13 in the clinic. EKG was normal ( reviewed: MINIMAL ST-T depression V6). No additional studies were done.   She does not exercise. She does eat some red meat; she does not ingest excessive fried foods. She does have a past history of bigeminy. She was seen by Dr. Caryl Comes for this and a stress test performed. This was approximately 3 years ago.  Her father had a heart attack in his late 66s and bypass.    Review of Systems  She believes she is perimenopausal.  She denies associated constellation of headache, flushing, diarrhea  She has had no prior history of paroxysmal tachycardia or hypertension.     Objective:   Physical Exam Appears healthy and well-nourished & in no acute distress  No carotid bruits are present.No neck pain distention present at 10 - 15 degrees. Thyroid normal to palpation  Heart rhythm and rate are normal with no gallop or murmur  Chest is clear with no increased work of breathing  There is no evidence of aortic aneurysm or renal artery bruits  Abdomen soft with no organomegaly or masses. No HJR  No clubbing, cyanosis or edema present.  Pedal pulses are intact   No ischemic skin changes are present . Fingernails healthy   Alert and oriented. Strength, tone, DTRs reflexes normal          Assessment & Plan:  #1 paroxysmal hypertension. Home  blood pressure monitor will be pursued. If she does have paroxysmal hypertension; 24 urine for catecholamines and metanephrines will be indicated.  #2 atypical chest pain  #3 past history of bigeminy  #4 headache  Plan: See orders and recommendations.

## 2013-10-13 NOTE — Patient Instructions (Signed)
Your next office appointment will be determined based upon review of your pending labs . Those instructions will be transmitted to you through My Chart  OR  by mail;whichever process is your choice to receive results & recommendations .  Followup as needed for your acute issue. Please report any significant change in your symptoms. Minimal Blood Pressure Goal= AVERAGE < 140/90;  Ideal is an AVERAGE < 135/85. This AVERAGE should be calculated from @ least 5-7 BP readings taken @ different times of day on different days of week. You should not respond to isolated BP readings , but rather the AVERAGE for that week .Please bring your  blood pressure cuff to office visits to verify that it is reliable.It  can also be checked against the blood pressure device at the pharmacy. Finger or wrist cuffs are not dependable; an arm cuff is.

## 2013-10-13 NOTE — Progress Notes (Signed)
Pre visit review using our clinic review tool, if applicable. No additional management support is needed unless otherwise documented below in the visit note. 

## 2013-10-14 LAB — TSH: TSH: 2.4 u[IU]/mL (ref 0.35–4.50)

## 2013-10-14 LAB — BASIC METABOLIC PANEL
BUN: 13 mg/dL (ref 6–23)
CO2: 31 mEq/L (ref 19–32)
Calcium: 9.4 mg/dL (ref 8.4–10.5)
Chloride: 102 mEq/L (ref 96–112)
Creatinine, Ser: 0.7 mg/dL (ref 0.4–1.2)
GFR: 90.12 mL/min (ref >60.00)
Glucose, Bld: 90 mg/dL (ref 70–99)
Potassium: 4.1 mEq/L (ref 3.5–5.1)
Sodium: 139 mEq/L (ref 135–145)

## 2013-10-18 ENCOUNTER — Telehealth: Payer: Self-pay

## 2013-10-18 NOTE — Telephone Encounter (Signed)
Pt husband came in to have paper work filled out to have a 90 supply of Duloxetine HCL DR Caps filled.   Paperwork ready for MD sign.

## 2013-10-19 ENCOUNTER — Other Ambulatory Visit: Payer: Self-pay

## 2013-10-19 MED ORDER — DULOXETINE HCL 60 MG PO CPEP: ORAL_CAPSULE | ORAL | Status: DC

## 2013-10-19 NOTE — Telephone Encounter (Signed)
Pt husband is on DPR Pt husband informed that pharm for Duloxetine HCL DR Caps [facility] and a 90 day supply was sent in.  Husband stated that he would let pt know.

## 2013-11-18 ENCOUNTER — Ambulatory Visit (INDEPENDENT_AMBULATORY_CARE_PROVIDER_SITE_OTHER): Payer: Commercial Managed Care - PPO | Admitting: Internal Medicine

## 2013-11-18 ENCOUNTER — Encounter: Payer: Self-pay | Admitting: Internal Medicine

## 2013-11-18 VITALS — BP 114/68 | HR 85 | Ht 66.0 in | Wt 194.0 lb

## 2013-11-18 DIAGNOSIS — R079 Chest pain, unspecified: Secondary | ICD-10-CM

## 2013-11-18 DIAGNOSIS — I10 Essential (primary) hypertension: Secondary | ICD-10-CM

## 2013-11-18 NOTE — Progress Notes (Signed)
ELECTROPHYSIOLOGY CONSULT NOTE  Patient ID: [name] [name], MRN: [id number], DOB/AGE: Jul 21, 1960 53 y.o. Admit date: (Not on file) Date of Consult: 11/18/2013  Primary Physician: Gwendolyn Grant, MD Primary Cardiologist: Hop  Chief Complaint:  Chest pain   HPI [name] [name] is a 53 y.o. female  Has a long-standing history of hypertension. About 3 weeks ago she had an episode where she had a vague chest discomfort which he described as a tightness it was associated with nausea a throbbing sensation in her neck and notable dyspnea on exertion. There was no radiation. She was seen by the health clinic at her workplace; blood pressures were recorded in the 190-200 range bilaterally.  They gradually improved.  Since that time she has noted persistent but less dyspnea on exertion new since that event. She has not had anymore chest tightness. She denies peripheral edema. There have been no interval travel.  Family history of heart disease and hypertension are present she does not smoke;  lipid levels are not in the chart.  She now it is a great deal of psychosocial stress; this has not changed particularly of late.    Past Medical History  Diagnosis Date  . Depression   . Anxiety   . Hypertension   . IBS (irritable bowel syndrome)   . Migraine, unspecified, without mention of intractable migraine without mention of status migrainosus   . Tachycardia, unspecified     on BBloc      Surgical History:  Past Surgical History  Procedure Laterality Date  . Tubal ligation    . Bilateral myofascial release for recurrent plantar faciitis       Home Meds: Prior to Admission medications   Medication Sig Start Date End Date Taking? Authorizing Provider  DULoxetine (CYMBALTA) 60 MG capsule TAKE 1 CAPSULE DAILY 10/19/13  Yes Rowe Clack, MD  enalapril-hydrochlorothiazide (VASERETIC) 10-25 MG per tablet TAKE 1 TABLET BY MOUTH DAILY 07/09/13  Yes Rowe Clack, MD  metoprolol  tartrate (LOPRESSOR) 25 MG tablet TAKE 1 TABLET BY MOUTH TWICE DAILY 07/26/13  Yes Rowe Clack, MD      Allergies:  Allergies  Allergen Reactions  . Codeine     REACTION: causes nausea and vomiting    History   Social History  . Marital Status: Married    Spouse Name: N/A    Number of Children: 0  . Years of Education: 13   Occupational History  . sales    Social History Main Topics  . Smoking status: Never Smoker   . Smokeless tobacco: Never Used  . Alcohol Use: Yes     Comment:  socially  . Drug Use: No  . Sexual Activity: Yes    Partners: Male   Other Topics Concern  . Not on file   Social History Narrative   HSG. 1 year [employer]. Married '86-divorced after 50yrs; remarried  '96. No children, 2 step children. 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 Marriage is stable.     Family History  Problem Relation Age of Onset  . Cancer Mother     breast cancer-left mastectomy  . Asthma Mother   . Alzheimer's disease Mother   . Transient ischemic attack Mother   . Hypertension Father   . Hyperlipidemia Father   . Diabetes Father   . Heart attack Father     > 55,CABG  . Heart failure Father   . Pancreatitis Father   .  Alzheimer's disease Maternal Grandmother      The ROS:  Please see the history of present illness.   Negative   All other systems reviewed and negative.    Physical Exam   Blood pressure 114/68, pulse 85, height 5\' 6"  (1.676 m), weight 194 lb (87.998 kg). General: Well developed, well nourished female in no acute distress. Head: Normocephalic, atraumatic, sclera non-icteric, no xanthomas, nares are without discharge. EENT: normal Lymph Nodes:  none Back: without scoliosis/kyphosis, no CVA tendersness Neck: Negative for carotid bruits. JVD not elevated. Lungs: Clear bilaterally to auscultation without wheezes, rales, or rhonchi. Breathing is unlabored. Heart: RRR with S1 S2. 2/6  systolic murmur murmur , rubs, or gallops appreciated. Abdomen: Soft, non-tender, non-distended with normoactive bowel sounds. No hepatomegaly. No rebound/guarding. No obvious abdominal masses. Msk:  Strength and tone appear normal for age. Extremities: No clubbing or cyanosis. No edema.  Distal pedal pulses are 2+ and equal bilaterally. Skin: Warm and Dry Neuro: Alert and oriented X 3. CN III-XII intact Grossly normal sensory and motor function . Psych:  Responds to questions appropriately with a normal affect.      Labs: Cardiac Enzymes No results found for this basename: CKTOTAL, CKMB, TROPONINI,  in the last 72 hours CBC Lab Results  Component Value Date   WBC 9.6 10/13/2013   HGB 13.9 10/13/2013   HCT 40.9 10/13/2013   MCV 84.7 10/13/2013   PLT 278.0 10/13/2013   PROTIME: No results found for this basename: LABPROT, INR,  in the last 72 hours Chemistry No results found for this basename: NA, K, CL, CO2, BUN, CREATININE, CALCIUM, LABALBU, PROT, BILITOT, ALKPHOS, ALT, AST, GLUCOSE,  in the last 168 hours Lipids Lab Results  Component Value Date   CHOL 188 08/03/2009   HDL 48.60 08/03/2009   LDLCALC 111* 08/03/2009   TRIG 144.0 08/03/2009   BNP No results found for this basename: probnp   Miscellaneous No results found for this basename: DDIMER    Radiology/Studies:  No results found.  EKG:  Sinus rhythm at 85 with intervals 13/08/39 nonspecific inferolateral ST changes   Assessment and Plan:  Chest pain syndrome  Dyspnea on exertion  Hypertension  Psychosocial stress   The patient had an unusual chest pain syndrome accompanied by significant elevation in blood pressure associated with dyspnea on exertion the latter of which has persisted and gradually diminishing over recent weeks. Given the dyspnea her modestly abnormal ECG and her family history, we'll undertake stress Myoview scanning. We'll also begin her on low-dose aspirin.   I agree with Dr. Linna Darner that if  she were to have recurrent episodes of paroxysmal hypertension that exclusion of a pheochromocytoma would be appropriate   Given her fatigue which is significant, not withstanding the psychosocial stress, will undertake a sleep study.  She is encouraged to continue to seek relief for her stress which is related unfortunately nonchanging life issues. She is on antidepressants with some improvement.   Virl Axe

## 2013-11-18 NOTE — Patient Instructions (Signed)
Your physician recommends that you continue on your current medications as directed. Please refer to the Current Medication list given to you today.  Your physician has recommended that you have a sleep study. This test records several body functions during sleep, including: brain activity, eye movement, oxygen and carbon dioxide blood levels, heart rate and rhythm, breathing rate and rhythm, the flow of air through your mouth and nose, snoring, body muscle movements, and chest and belly movement.  Your physician has requested that you have en exercise stress myoview. For further information please visit [email or web address]. Please follow instruction sheet, as given.  Follow up to be determined after testing.

## 2013-11-19 ENCOUNTER — Encounter: Payer: Self-pay | Admitting: Internal Medicine

## 2013-11-30 ENCOUNTER — Encounter: Payer: Self-pay | Admitting: Internal Medicine

## 2013-12-06 ENCOUNTER — Ambulatory Visit (HOSPITAL_COMMUNITY): Payer: Commercial Managed Care - PPO | Attending: Internal Medicine | Admitting: Radiology

## 2013-12-06 VITALS — BP 121/74 | HR 65 | Ht 66.0 in | Wt 194.0 lb

## 2013-12-06 DIAGNOSIS — R0989 Other specified symptoms and signs involving the circulatory and respiratory systems: Secondary | ICD-10-CM | POA: Diagnosis not present

## 2013-12-06 DIAGNOSIS — R5383 Other fatigue: Secondary | ICD-10-CM | POA: Diagnosis not present

## 2013-12-06 DIAGNOSIS — I10 Essential (primary) hypertension: Secondary | ICD-10-CM | POA: Insufficient documentation

## 2013-12-06 DIAGNOSIS — R0609 Other forms of dyspnea: Secondary | ICD-10-CM | POA: Insufficient documentation

## 2013-12-06 DIAGNOSIS — R079 Chest pain, unspecified: Secondary | ICD-10-CM | POA: Diagnosis present

## 2013-12-06 DIAGNOSIS — R5381 Other malaise: Secondary | ICD-10-CM | POA: Diagnosis not present

## 2013-12-06 DIAGNOSIS — R0602 Shortness of breath: Secondary | ICD-10-CM

## 2013-12-06 MED ORDER — TECHNETIUM TC 99M SESTAMIBI GENERIC - CARDIOLITE
10.0000 | Freq: Once | INTRAVENOUS | Status: AC | PRN
  Administered 2013-12-06: 10 via INTRAVENOUS

## 2013-12-06 MED ORDER — TECHNETIUM TC 99M SESTAMIBI GENERIC - CARDIOLITE
30.0000 | Freq: Once | INTRAVENOUS | Status: AC | PRN
  Administered 2013-12-06: 30 via INTRAVENOUS

## 2013-12-06 NOTE — Progress Notes (Signed)
[facility] [address] [place],  [address] [phone]    Cardiology Nuclear Med Study  [name] [name] is a 53 y.o. female     MRN : [id number]     DOB: [birth year]  Procedure Date: 12/06/2013  Nuclear Med Background Indication for Stress Test:  Evaluation for Ischemia and Abnormal EKG History:  No known CAD Cardiac Risk Factors: Hypertension  Symptoms:  Chest Pain (last date of chest discomfort was two months ago ), DOE and Fatigue   Nuclear Pre-Procedure Caffeine/Decaff Intake:  None NPO After: 7:00pm   Lungs:  clear O2 Sat: 98% on room air. IV 0.9% NS with Angio Cath:  22g  IV Site: R Hand  IV Started by:  Crissie Figures, RN  Chest Size (in):  34 Cup Size: DD  Height: 5\' 6"  (1.676 m)  Weight:  194 lb (87.998 kg)  BMI:  Body mass index is 31.33 kg/(m^2). Tech Comments:  Held x Lopressor x 24 hrs    Nuclear Med Study 1 or 2 day study: 1 day  Stress Test Type:  Stress  Reading MD: N/A  Order Authorizing Provider:  Jolyn Nap, MD  Resting Radionuclide: Technetium 57m Sestamibi  Resting Radionuclide Dose: 11.0 mCi   Stress Radionuclide:  Technetium 29m Sestamibi  Stress Radionuclide Dose: 33.0 mCi           Stress Protocol Rest HR: 65 Stress HR: 157  Rest BP: 121/74 Stress BP: 148/83  Exercise Time (min): 6:00 METS: 7.0           Dose of Adenosine (mg):  n/a Dose of Lexiscan: n/a mg  Dose of Atropine (mg): n/a Dose of Dobutamine: n/a mcg/kg/min (at max HR)  Stress Test Technologist: Glade Lloyd, BS-ES  Nuclear Technologist:  Earl Many, CNMT     Rest Procedure:  Myocardial perfusion imaging was performed at rest 45 minutes following the intravenous administration of Technetium 38m Sestamibi. Rest ECG: Normal sinus rhythm. Mild diffuse ST flattening  Stress Procedure:  The patient exercised on the treadmill utilizing the Bruce Protocol for 6:00 minutes. The patient stopped due to fatigue and denied any chest pain.   Patient became lightheaded in recovery but symptom resolved quickly. Technetium 13m Sestamibi was injected at peak exercise and myocardial perfusion imaging was performed after a brief delay.  Stress ECG: No significant change from baseline ECG  QPS Raw Data Images:  Normal; no motion artifact; normal heart/lung ratio. Stress Images:  There is very slight variability in uptake. There are no diagnostic abnormalities. Rest Images:   Rest images are similar to stress images. Subtraction (SDS):  No evidence of ischemia. Transient Ischemic Dilatation (Normal <1.22):  1.00 Lung/Heart Ratio (Normal <0.45):  0.39  Quantitative Gated Spect Images QGS EDV:  95 ml QGS ESV:  34 ml  Impression Exercise Capacity:  There is only fair exercise capacity. The patient did not have chest pain. BP Response:  Normal blood pressure response. Clinical Symptoms:  No significant symptoms noted. ECG Impression:  No significant ST segment change suggestive of ischemia. Comparison with Prior Nuclear Study: No images to compare  Overall Impression:  Normal stress nuclear study. There is no scar or ischemia. There is normal wall motion. This is a low risk scan.  LV Ejection Fraction: 64%.  LV Wall Motion:  Normal Wall Motion .  Dola Argyle , MD

## 2013-12-07 NOTE — Addendum Note (Signed)
Addended by: Evlyn Kanner on: 12/07/2013 08:25 AM   Modules accepted: Orders

## 2013-12-08 ENCOUNTER — Other Ambulatory Visit: Payer: Self-pay | Admitting: Internal Medicine

## 2013-12-31 ENCOUNTER — Other Ambulatory Visit: Payer: Self-pay

## 2014-03-07 ENCOUNTER — Other Ambulatory Visit: Payer: Self-pay | Admitting: *Deleted

## 2014-03-18 HISTORY — PX: OTHER SURGICAL HISTORY: SHX169

## 2014-03-22 ENCOUNTER — Telehealth: Payer: Self-pay | Admitting: *Deleted

## 2014-03-22 NOTE — Telephone Encounter (Signed)
Had spoke with patient on 03/07/14 to arrange home sleep study testing. Patient was agreeable at the time. Received paperwork from [facility] explaining it had been cancelled secondary to financial responsibility. Spoke with patient who tells me at this time she needs to take care of another bill first. She will call us when she is ready to schedule this test.

## 2014-03-23 ENCOUNTER — Other Ambulatory Visit: Payer: Self-pay | Admitting: Internal Medicine

## 2014-06-02 ENCOUNTER — Other Ambulatory Visit: Payer: Self-pay | Admitting: Internal Medicine

## 2014-07-08 NOTE — Discharge Summary (Signed)
PATIENT NAME:  [name] [name], [name] [name] MR#:  [id number] DATE OF BIRTH:  11-03-1960  DATE OF ADMISSION:  04/19/2012 DATE OF DISCHARGE:  04/23/2012  HOSPITAL COURSE: See dictated history and physical for details of admission. This 54 year old woman presented to ([facility] after having taken an intentional overdose of carbamazepine. She had suicidal ideation at the time and concealed her overdose from her husband until she began feel very sick. In the hospital here, she had several days of symptoms of toxicity from carbamazepine including feeling dizzy, sick to her stomach, having paresthesias and tingling in her hands and feet and being unable to sit up without getting dizzy. The patient eventually cleared from all these symptoms and was able to get up and interact appropriately with staff and peers. She attended some groups. She had improved insight. She was counseled about depression and the dangers of untreatment of it and the appropriate treatment plan. She had an appropriate meeting with her husband, which went well and reassured her. At the time of discharge, her mood  and affect were much improved. She denied suicidal ideation. We had been increased her Cymbalta to 60 mg a day as a treatment for her depression and she was referred to be seen by private providers in [place] area.   DISCHARGE MEDICATIONS: Cymbalta 60 mg per day, Monistat vaginal cream applicator once a day for 5 more days, metoprolol 25 mg b.i.d. and enalapril/hydrochlorothiazide 10/25 mg tablets 1 per day.   LABORATORY RESULTS: The patient's admission labs showed a Tegretol level initially at 13.9. Later rechecked it had actually gone up to a peak of 14.5. CBC showed an elevated white count at 14.7, otherwise unremarkable. Chemistry panel showed a glucose of 108, sodium low at 135, otherwise unremarkable. Carbamazepine level gradually came down and it was last checked on 02/05 at which time it was down to 6.5.  Drug screen was negative.   MENTAL STATUS EXAM AT DISCHARGE: Neatly dressed and groomed woman, who looks her stated age. Pleasant and cooperative. Good eye contact, normal psychomotor activity. Speech normal in rate, tone, and volume. Affect euthymic, reactive and appropriate. Mood stated as much better. Thoughts are lucid without any loosening of associations or delusions. Denies auditory or visual hallucinations. Denied suicidal or homicidal ideation. Shows improved judgment and insight, normal. Intelligence normal. Alert and oriented x 4.   DISPOSITION: Discharged back home with her family. Follow up with local private provider in [place].   DIAGNOSIS, PRINCIPLE AND PRIMARY: AXIS I: Major depression, single episode, severe.   SECONDARY DIAGNOSES:  AXIS I: Deferred.  AXIS II: Deferred.  AXIS III: Hypertension, status post overdose of carbamazepine.  AXIS IV: Moderate to severe from multiple stresses in her life.  AXIS V: Functioning at time of discharge 60.    ____________________________ Gonzella Lex, MD jtc:aw D: 05/05/2012 23:18:51 ET T: 05/06/2012 07:41:54 ET JOB#: [id number]  cc: Gonzella Lex, MD, <Dictator> Gonzella Lex MD ELECTRONICALLY SIGNED 05/06/2012 9:19

## 2014-07-08 NOTE — H&P (Signed)
PATIENT NAME:  [name] [name] MR#:  [id number] DATE OF BIRTH:  06/06/1960  DATE OF ADMISSION:  04/19/2012  IDENTIFYING INFORMATION AND CHIEF COMPLAINT: A 54 year old woman admitted in transfer from [facility] under involuntary commitment because of intentional overdose on carbamazepine.   CHIEF COMPLAINT: "It just all got to be too much."   HISTORY OF PRESENT ILLNESS: Information obtained from the patient and chart and outside records. The patient presented to [facility] over the weekend with a report that on Friday she took an intentional overdose of carbamazepine. She tells me that it had been a bad day and she was feeling overwhelmed. She had had an unpleasant encounter at work and was feeling the weight of her excessive stress related to caring for her elderly parents. She took an overdose of carbamazepine belonging to her husband. She reports that at that time she was having vague thoughts about suicidality. After doing it, she told her husband what she had done but minimizes the number of pills and did not disclose how many she took. Over the next day she began to develop symptoms of nausea, dizziness, paresthesias and told her husband that she had actually taken about 12 of the carbamazepine. He brought her to the hospital where her carbamazepine level was elevated in the range of 15. She was stabilized at [facility], but they had no psychiatric bed immediately available and transferred her to our facility. The patient states that her mood has been bad for about the last 18 months. It has been getting progressively worse. She feels run down and tired. Does not feel like she can do things the way she used to. Feels overwhelmed by things that use to be easy for her to do. Feels unappreciated and hopeless about ever feeling better. Energy level is low. Sleep is poor. She has been taking antidepressants, but only prescribed by her primary care doctor and is currently on Cymbalta 30 mg a  day. This was apparently a recent change from another antidepressant, which may or may not have been of any value, probably Paxil.   PAST PSYCHIATRIC HISTORY: The patient has no previous psychiatric hospitalizations. She reports that she drinks only occasionally and does not abuse any other drugs regularly. She has been treated as an outpatient by her primary care doctor, but has not seen a therapist and not seeing a psychiatrist. She denies prior suicide attempts.   PAST MEDICAL HISTORY: The patient has high blood pressure. No other ongoing medical problems.   SOCIAL HISTORY: The patient works doing Therapist, art. She is married. She has no children. Relationship with husband has been difficult and she also has been stressed by having to take care of elderly parents.   CURRENT MEDICATIONS: Hyzaar, unknown dose for blood pressure, metoprolol 25 mg b.i.d., Cymbalta at 30 mg per day.   ALLERGIES: CODEINE.   REVIEW OF SYSTEMS: Currently complaining of nausea, dizziness, tingling all over, general feeling of weakness. She has thrown up several times today. She has a difficult time keeping food down. Also complains of depression and low energy, hopelessness.   MENTAL STATUS EXAM:  The patient was interviewed in her hospital room. Passively cooperative. Psychomotor activity limited. Eye contact limited. Speech quiet, but easy to understand. Affect blunted and sad, some tearfulness. Mood stated as being bad. Thoughts are generally lucid. No evidence of loosening of associations or delusions. Denies auditory or visual hallucinations. Still vague about suicidal ideation. Denies homicidal ideation. Appears to be of average intelligence.  Short and long-term memory grossly intact. Alert and oriented x 4.   PHYSICAL EXAMINATION: GENERAL: The patient appears to be uncomfortable and somewhat dizzy, but she is able to sit up and cooperate with exam. Face is symmetric. Pupils are equal and reactive. Oral mucosa  dry. Strength is normal throughout and symmetric. She has an unsteady gait as though she were dizzy on standing up. Cranial nerves symmetric and normal.  BACK: Nontender.  LUNGS: Clear with no wheezes.  HEART: Regular rate and rhythm.  ABDOMEN: Soft, nontender, normal bowel sounds.  VITAL SIGNS: Temperature 97.6, pulse 77, respirations 18, blood pressure 120/75.   LABORATORY RESULTS: On arrival to our facility, her Tegretol level was 13.9, which actually went up to 14.5 on followup. Today I rechecked labs this evening and found a white count elevated at 14.7. Other chemistry labs are pending.    ASSESSMENT: A 54 year old woman with multiple symptoms consistent with a major depression who made a serious suicide attempt. Currently recovering from an overdose of Tegretol. Otherwise physically stable. Still, tearful upset, depressed.   TREATMENT PLAN: Supportive and educational therapy done. Discussed possible treatment options including increasing the dose of her Cymbalta. For now recheck labs, monitor physical status as she recovers from the Tegretol overdose. Recheck Tegretol level. Try and get collateral history if possible from the family. Work on appropriate disposition at the appropriate time.   DIAGNOSIS PRINCIPLE AND PRIMARY:   AXIS I: Major depression, severe, recurrent.   SECONDARY DIAGNOSES:  AXIS I:  No diagnosis.   AXIS II: Deferred.   AXIS III: High blood pressure, carbamazepine overdose.   AXIS IV: Moderate to severe stress, as described above.   AXIS V: Functioning at time of evaluation:  58.      ____________________________ Gonzella Lex, MD jtc:cc D: 04/20/2012 19:29:44 ET T: 04/20/2012 22:06:30 ET JOB#: [id number]  cc: Gonzella Lex, MD, <Dictator> Gonzella Lex MD ELECTRONICALLY SIGNED 04/21/2012 9:39

## 2014-07-25 ENCOUNTER — Encounter: Payer: Self-pay | Admitting: Internal Medicine

## 2014-08-19 ENCOUNTER — Other Ambulatory Visit: Payer: Self-pay | Admitting: Internal Medicine

## 2014-11-18 ENCOUNTER — Telehealth: Payer: Self-pay | Admitting: Internal Medicine

## 2014-11-18 MED ORDER — DULOXETINE HCL 60 MG PO CPEP: 60.0000 mg | ORAL_CAPSULE | Freq: Every day | ORAL | Status: DC

## 2014-11-18 NOTE — Telephone Encounter (Signed)
I have sent 90 days to local only. Not able to refill anymore than that due to the pt has not been seen in over a year.   Pt needs to have a CPE with someone here in the office.   FYI: note also placed on sig of rx sent to local.

## 2014-11-18 NOTE — Telephone Encounter (Signed)
Pt husband called in and said that [facility] has [facility] over refill request for  DULoxetine (CYMBALTA) 60 MG capsule [[id number]]       DULoxetine (CYMBALTA) 60 MG capsule [[id number]     He said that they will not get these before she runs out and wanted to know if 30 days could be called in today to:  [facility]  [address]   Luvenia Heller number  [phone]

## 2014-11-23 ENCOUNTER — Telehealth: Payer: Self-pay | Admitting: Internal Medicine

## 2014-11-23 LAB — BASIC METABOLIC PANEL
BUN: 15 mg/dL (ref 4–21)
Creatinine: 0.8 mg/dL (ref 0.5–1.1)
Glucose: 86 mg/dL
Potassium: 3.6 mmol/L (ref 3.4–5.3)
Sodium: 139 mmol/L (ref 137–147)

## 2014-11-23 LAB — HEPATIC FUNCTION PANEL
ALT: 29 U/L (ref 7–35)
AST: 21 U/L (ref 13–35)
Alkaline Phosphatase: 57 U/L (ref 25–125)
Bilirubin, Direct: 0.1 mg/dL (ref 0.01–0.4)
Bilirubin, Total: 0.3 mg/dL

## 2014-11-23 LAB — TSH: TSH: 2.01 u[IU]/mL (ref 0.41–5.90)

## 2014-11-23 NOTE — Telephone Encounter (Signed)
Pt has an physical with Dr. Linna Darner 12/23/14 and she also stated when she pick up from [facility], she only got 30 days supply but we send in 35. Can we please check on that

## 2014-11-23 NOTE — Telephone Encounter (Signed)
Please see previous phone note.  

## 2014-11-23 NOTE — Telephone Encounter (Signed)
Pt request refill for DULoxetine (CYMBALTA) 60 MG capsule to [facility] days supply.

## 2014-11-24 ENCOUNTER — Other Ambulatory Visit: Payer: Self-pay

## 2014-11-24 MED ORDER — DULOXETINE HCL 60 MG PO CPEP: 60.0000 mg | ORAL_CAPSULE | Freq: Every day | ORAL | Status: DC

## 2014-11-24 NOTE — Telephone Encounter (Signed)
Not able to get through to the pharmacy. They are having some type of phone issue. Resent rx.

## 2014-12-07 ENCOUNTER — Telehealth: Payer: Self-pay | Admitting: *Deleted

## 2014-12-07 ENCOUNTER — Other Ambulatory Visit: Payer: Self-pay | Admitting: Internal Medicine

## 2014-12-07 MED ORDER — ENALAPRIL-HYDROCHLOROTHIAZIDE 10-25 MG PO TABS: 1.0000 | ORAL_TABLET | Freq: Every day | ORAL | Status: DC

## 2014-12-07 NOTE — Telephone Encounter (Signed)
Receive call pt states she has made appt to see Dr. Linna Darner 12/23/14, but she is out of her enalapril requesting refill to be sent to [facility]. Inform pt can send in 30 day until appt...Johny Chess

## 2014-12-19 LAB — HM COLONOSCOPY

## 2014-12-23 ENCOUNTER — Encounter: Payer: Commercial Managed Care - PPO | Admitting: Internal Medicine

## 2015-01-05 ENCOUNTER — Ambulatory Visit (INDEPENDENT_AMBULATORY_CARE_PROVIDER_SITE_OTHER): Payer: Commercial Managed Care - PPO | Admitting: Internal Medicine

## 2015-01-05 ENCOUNTER — Encounter: Payer: Self-pay | Admitting: Internal Medicine

## 2015-01-05 VITALS — BP 124/72 | HR 85 | Temp 98.2°F | Resp 16 | Ht 66.0 in | Wt 194.0 lb

## 2015-01-05 DIAGNOSIS — Z23 Encounter for immunization: Secondary | ICD-10-CM

## 2015-01-05 DIAGNOSIS — Z0000 Encounter for general adult medical examination without abnormal findings: Secondary | ICD-10-CM

## 2015-01-05 DIAGNOSIS — I10 Essential (primary) hypertension: Secondary | ICD-10-CM

## 2015-01-05 MED ORDER — DULOXETINE HCL 60 MG PO CPEP: 60.0000 mg | ORAL_CAPSULE | Freq: Every day | ORAL | Status: DC

## 2015-01-05 MED ORDER — METOPROLOL TARTRATE 25 MG PO TABS: 25.0000 mg | ORAL_TABLET | Freq: Two times a day (BID) | ORAL | Status: DC

## 2015-01-05 MED ORDER — ENALAPRIL-HYDROCHLOROTHIAZIDE 10-25 MG PO TABS: 1.0000 | ORAL_TABLET | Freq: Every day | ORAL | Status: DC

## 2015-01-05 NOTE — Progress Notes (Signed)
Pre visit review using our clinic review tool, if applicable. No additional management support is needed unless otherwise documented below in the visit note. 

## 2015-01-05 NOTE — Progress Notes (Signed)
   Subjective:    Patient ID: [name] [name], female    DOB: 11-Jun-1960, 54 y.o.   MRN: [id number]  HPI The patient is here for a physical to assess status of active health conditions.  PMH, FH, & Social History reviewed & updated.No change in [place] as recorded.  She will have fried foods or red meat on average once weekly. She does not ingest excess salt. Until recent flare of knee pain she had exercised 4 times a week for 30-40 minutes on a treadmill or elliptical. There is a PMH of L, Baker's cyst in 2009.  She has had no associated cardiopulmonary symptoms with the exercise. She has been compliant with her medicines without adverse effects. She is not monitoring blood pressure.  The generic Prozac has been of value in controlling any depression or mood swings. There's been no impact on appetite or sleep.  A colonoscopy was recently completed; follow-up in 5 years was recommended because of polyps.She has no active GI symptoms.  About the time she had the colonoscopy she developed pain in the left popliteal space which has persisted. This is limiting her exercise. Review of Systems  Chest pain, palpitations, tachycardia, exertional dyspnea, paroxysmal nocturnal dyspnea, claudication or edema are absent. No unexplained weight loss, abdominal pain, significant dyspepsia, dysphagia, melena, rectal bleeding, or persistently small caliber stools. Dysuria, pyuria, hematuria, frequency, nocturia or polyuria are denied. Change in hair, skin, nails denied. No bowel changes of constipation or diarrhea. No intolerance to heat or cold.     Objective:   Physical Exam  Pertinent or positive findings include: She has isolated DIP osteoarthritic changes. There is a small area of hyperpigmentation of the left maxillary area laterally. A popliteal cyst is noted on the left. She has mild crepitus in the knees. General appearance :adequately nourished; in no distress.  Eyes: No conjunctival inflammation  or scleral icterus is present.  Oral exam:  Lips and gums are healthy appearing.There is no oropharyngeal erythema or exudate noted. Dental hygiene is good.  Heart:  Normal rate and regular rhythm. S1 and S2 normal without gallop, murmur, click, rub or other extra sounds    Lungs:Chest clear to auscultation; no wheezes, rhonchi,rales ,or rubs present.No increased work of breathing.   Abdomen: bowel sounds normal, soft and non-tender without masses, organomegaly or hernias noted.  No guarding or rebound. No flank tenderness to percussion.  Vascular : all pulses equal ; no bruits present.  Skin:Warm & dry.  Intact without suspicious lesions or rashes ; no tenting or jaundice   Lymphatic: No lymphadenopathy is noted about the head, neck, axilla.   Neuro: Strength, tone & DTRs normal.    Assessment & Plan:  #1 comprehensive physical exam; no acute findings  #2 degenerative joint disease of the knee with popliteal cyst  Plan: see Orders  & Recommendations and after visit summary

## 2015-01-05 NOTE — Patient Instructions (Addendum)
  Your next office appointment will be determined based upon review of your pending labs. Those written interpretation of the lab results and instructions will be transmitted to you by My Chart Critical results will be called. Followup as needed for any active or acute issue. Please report any significant change in your symptoms. Use an anti-inflammatory cream such as Aspercreme or Zostrix cream twice a day to the knee as needed. In lieu of this warm moist compresses or  hot water bottle can be used. Do not apply ice .Consider glucosamine sulfate 1500 mg daily for joint symptoms. Take this daily  for 4-6 weeks. This will rehydrate the cartilages. Please take Aleve one to 2 every 8-12 hours with food as needed for the knee pain.  Minimal Blood Pressure Goal= AVERAGE < 140/90;  Ideal is an AVERAGE < 135/85. This AVERAGE should be calculated from @ least 5-7 BP readings taken @ different times of day on different days of week. You should not respond to isolated BP readings , but rather the AVERAGE for that week .Please bring your  blood pressure cuff to office visits to verify that it is reliable.It  can also be checked against the blood pressure device at the pharmacy. Finger or wrist cuffs are not dependable; an arm cuff is.

## 2015-01-18 ENCOUNTER — Telehealth: Payer: Self-pay | Admitting: Internal Medicine

## 2015-01-18 MED ORDER — DULOXETINE HCL 60 MG PO CPEP: 60.0000 mg | ORAL_CAPSULE | Freq: Every day | ORAL | Status: DC

## 2015-01-18 NOTE — Telephone Encounter (Signed)
erx done

## 2015-01-18 NOTE — Telephone Encounter (Signed)
Pt is needing a 1 month supply of DULoxetine (CYMBALTA) 60 MG capsule [[id number]]  Be sent to [facility] on [address] then the 90 day supply sent to [facility].

## 2015-01-20 ENCOUNTER — Other Ambulatory Visit (INDEPENDENT_AMBULATORY_CARE_PROVIDER_SITE_OTHER): Payer: Commercial Managed Care - PPO

## 2015-01-20 DIAGNOSIS — Z0189 Encounter for other specified special examinations: Secondary | ICD-10-CM

## 2015-01-20 DIAGNOSIS — Z0000 Encounter for general adult medical examination without abnormal findings: Secondary | ICD-10-CM

## 2015-01-20 LAB — HEPATIC FUNCTION PANEL
ALT: 22 U/L (ref 0–35)
AST: 19 U/L (ref 0–37)
Albumin: 3.8 g/dL (ref 3.5–5.2)
Alkaline Phosphatase: 52 U/L (ref 39–117)
Bilirubin, Direct: 0.1 mg/dL (ref 0.0–0.3)
Total Bilirubin: 0.4 mg/dL (ref 0.2–1.2)
Total Protein: 6.9 g/dL (ref 6.0–8.3)

## 2015-01-20 LAB — BASIC METABOLIC PANEL
BUN: 14 mg/dL (ref 6–23)
CO2: 32 mEq/L (ref 19–32)
Calcium: 9.4 mg/dL (ref 8.4–10.5)
Chloride: 101 mEq/L (ref 96–112)
Creatinine, Ser: 0.78 mg/dL (ref 0.40–1.20)
GFR: 81.78 mL/min (ref >60.00)
Glucose, Bld: 106 mg/dL — ABNORMAL HIGH (ref 70–99)
Potassium: 3.6 mEq/L (ref 3.5–5.1)
Sodium: 139 mEq/L (ref 135–145)

## 2015-01-20 LAB — CBC WITH DIFFERENTIAL/PLATELET
Basophils Absolute: 0 10*3/uL (ref 0.0–0.1)
Basophils Relative: 0.7 % (ref 0.0–3.0)
Eosinophils Absolute: 0.2 10*3/uL (ref 0.0–0.7)
Eosinophils Relative: 3.1 % (ref 0.0–5.0)
HCT: 43.2 % (ref 36.0–46.0)
Hemoglobin: 14.5 g/dL (ref 12.0–15.0)
Lymphocytes Relative: 22.7 % (ref 12.0–46.0)
Lymphs Abs: 1.6 10*3/uL (ref 0.7–4.0)
MCHC: 33.5 g/dL (ref 30.0–36.0)
MCV: 84.3 fl (ref 78.0–100.0)
Monocytes Absolute: 0.6 10*3/uL (ref 0.1–1.0)
Monocytes Relative: 9.3 % (ref 3.0–12.0)
Neutro Abs: 4.4 10*3/uL (ref 1.4–7.7)
Neutrophils Relative %: 64.2 % (ref 43.0–77.0)
Platelets: 292 10*3/uL (ref 150.0–400.0)
RBC: 5.13 Mil/uL — ABNORMAL HIGH (ref 3.87–5.11)
RDW: 14.8 % (ref 11.5–15.5)
WBC: 6.9 10*3/uL (ref 4.0–10.5)

## 2015-01-20 LAB — LIPID PANEL
Cholesterol: 191 mg/dL (ref 0–200)
HDL: 37.9 mg/dL — ABNORMAL LOW (ref >39.00)
LDL Cholesterol: 129 mg/dL — ABNORMAL HIGH (ref 0–99)
NonHDL: 153.42
Total CHOL/HDL Ratio: 5
Triglycerides: 120 mg/dL (ref 0.0–149.0)
VLDL: 24 mg/dL (ref 0.0–40.0)

## 2015-01-20 LAB — TSH: TSH: 1.77 u[IU]/mL (ref 0.35–4.50)

## 2015-01-23 ENCOUNTER — Telehealth: Payer: Self-pay

## 2015-01-23 ENCOUNTER — Other Ambulatory Visit (INDEPENDENT_AMBULATORY_CARE_PROVIDER_SITE_OTHER): Payer: Commercial Managed Care - PPO

## 2015-01-23 DIAGNOSIS — R739 Hyperglycemia, unspecified: Secondary | ICD-10-CM | POA: Diagnosis not present

## 2015-01-23 LAB — HEMOGLOBIN A1C: Hgb A1c MFr Bld: 5.6 % (ref 4.6–6.5)

## 2015-01-23 NOTE — Telephone Encounter (Signed)
Faxed to lab.

## 2015-01-23 NOTE — Telephone Encounter (Signed)
-----   Message from Hendricks Limes, MD sent at 01/20/2015  1:52 PM EDT ----- Please add A1c (R73.9)

## 2015-01-25 ENCOUNTER — Encounter: Payer: Self-pay | Admitting: Internal Medicine

## 2015-03-19 DIAGNOSIS — R748 Abnormal levels of other serum enzymes: Secondary | ICD-10-CM

## 2015-03-19 HISTORY — DX: Abnormal levels of other serum enzymes: R74.8

## 2015-05-17 LAB — HM MAMMOGRAPHY

## 2015-07-03 ENCOUNTER — Ambulatory Visit (INDEPENDENT_AMBULATORY_CARE_PROVIDER_SITE_OTHER): Payer: Commercial Managed Care - PPO | Admitting: Internal Medicine

## 2015-07-03 ENCOUNTER — Ambulatory Visit (INDEPENDENT_AMBULATORY_CARE_PROVIDER_SITE_OTHER)
Admission: RE | Admit: 2015-07-03 | Discharge: 2015-07-03 | Disposition: A | Discharge status: Home / Self Care | Payer: Commercial Managed Care - PPO | Source: Ambulatory Visit | Attending: Internal Medicine | Admitting: Internal Medicine

## 2015-07-03 ENCOUNTER — Other Ambulatory Visit (INDEPENDENT_AMBULATORY_CARE_PROVIDER_SITE_OTHER): Payer: Commercial Managed Care - PPO

## 2015-07-03 ENCOUNTER — Encounter: Payer: Self-pay | Admitting: Internal Medicine

## 2015-07-03 VITALS — BP 116/70 | HR 96 | Temp 100.6°F | Resp 16 | Ht 66.0 in | Wt 196.0 lb

## 2015-07-03 DIAGNOSIS — R7989 Other specified abnormal findings of blood chemistry: Secondary | ICD-10-CM | POA: Diagnosis not present

## 2015-07-03 DIAGNOSIS — K5900 Constipation, unspecified: Secondary | ICD-10-CM | POA: Insufficient documentation

## 2015-07-03 DIAGNOSIS — R10817 Generalized abdominal tenderness: Secondary | ICD-10-CM

## 2015-07-03 DIAGNOSIS — E876 Hypokalemia: Secondary | ICD-10-CM | POA: Insufficient documentation

## 2015-07-03 DIAGNOSIS — R300 Dysuria: Secondary | ICD-10-CM

## 2015-07-03 DIAGNOSIS — R945 Abnormal results of liver function studies: Secondary | ICD-10-CM

## 2015-07-03 DIAGNOSIS — I10 Essential (primary) hypertension: Secondary | ICD-10-CM | POA: Diagnosis not present

## 2015-07-03 DIAGNOSIS — K5909 Other constipation: Secondary | ICD-10-CM

## 2015-07-03 DIAGNOSIS — T502X5A Adverse effect of carbonic-anhydrase inhibitors, benzothiadiazides and other diuretics, initial encounter: Secondary | ICD-10-CM | POA: Insufficient documentation

## 2015-07-03 LAB — CBC WITH DIFFERENTIAL/PLATELET
Basophils Absolute: 0 10*3/uL (ref 0.0–0.1)
Basophils Relative: 0.1 % (ref 0.0–3.0)
Eosinophils Absolute: 0.1 10*3/uL (ref 0.0–0.7)
Eosinophils Relative: 0.8 % (ref 0.0–5.0)
HCT: 43.6 % (ref 36.0–46.0)
Hemoglobin: 15 g/dL (ref 12.0–15.0)
Lymphocytes Relative: 4.1 % — ABNORMAL LOW (ref 12.0–46.0)
Lymphs Abs: 0.4 10*3/uL — ABNORMAL LOW (ref 0.7–4.0)
MCHC: 34.4 g/dL (ref 30.0–36.0)
MCV: 82.1 fl (ref 78.0–100.0)
Monocytes Absolute: 0.6 10*3/uL (ref 0.1–1.0)
Monocytes Relative: 6.7 % (ref 3.0–12.0)
Neutro Abs: 8.4 10*3/uL — ABNORMAL HIGH (ref 1.4–7.7)
Neutrophils Relative %: 88.3 % — ABNORMAL HIGH (ref 43.0–77.0)
Platelets: 264 10*3/uL (ref 150.0–400.0)
RBC: 5.31 Mil/uL — ABNORMAL HIGH (ref 3.87–5.11)
RDW: 13.8 % (ref 11.5–15.5)
WBC: 9.5 10*3/uL (ref 4.0–10.5)

## 2015-07-03 LAB — COMPREHENSIVE METABOLIC PANEL
ALT: 234 U/L — ABNORMAL HIGH (ref 0–35)
AST: 163 U/L — ABNORMAL HIGH (ref 0–37)
Albumin: 3.8 g/dL (ref 3.5–5.2)
Alkaline Phosphatase: 88 U/L (ref 39–117)
BUN: 7 mg/dL (ref 6–23)
CO2: 32 mEq/L (ref 19–32)
Calcium: 9.6 mg/dL (ref 8.4–10.5)
Chloride: 92 mEq/L — ABNORMAL LOW (ref 96–112)
Creatinine, Ser: 0.66 mg/dL (ref 0.40–1.20)
GFR: 99 mL/min (ref >60.00)
Glucose, Bld: 133 mg/dL — ABNORMAL HIGH (ref 70–99)
Potassium: 3 mEq/L — ABNORMAL LOW (ref 3.5–5.1)
Sodium: 133 mEq/L — ABNORMAL LOW (ref 135–145)
Total Bilirubin: 1 mg/dL (ref 0.2–1.2)
Total Protein: 7.2 g/dL (ref 6.0–8.3)

## 2015-07-03 LAB — POCT URINALYSIS DIPSTICK
Bilirubin, UA: NEGATIVE
Blood, UA: NEGATIVE
Glucose, UA: NEGATIVE
Ketones, UA: 5
Nitrite, UA: NEGATIVE
Protein, UA: 15
Spec Grav, UA: 1.015
Urobilinogen, UA: 1
pH, UA: 7

## 2015-07-03 LAB — LIPASE: Lipase: 25 U/L (ref 11.0–59.0)

## 2015-07-03 MED ORDER — LUBIPROSTONE 24 MCG PO CAPS: 24.0000 ug | ORAL_CAPSULE | Freq: Two times a day (BID) | ORAL | Status: DC

## 2015-07-03 MED ORDER — IOPAMIDOL (ISOVUE-300) INJECTION 61%
100.0000 mL | Freq: Once | INTRAVENOUS | Status: AC | PRN
  Administered 2015-07-03: 100 mL via INTRAVENOUS

## 2015-07-03 MED ORDER — POTASSIUM CHLORIDE CRYS ER 20 MEQ PO TBCR: 20.0000 meq | EXTENDED_RELEASE_TABLET | Freq: Two times a day (BID) | ORAL | Status: DC

## 2015-07-03 NOTE — Progress Notes (Signed)
Subjective:  Patient ID: [name] [name], female    DOB: Dec 30, 1960  Age: 55 y.o. MRN: [id number]  CC: Cough and Abdominal Pain  New to me  HPI [name] [name] presents for evaluation of abdominal pain, nausea, chills, fever, chronic nonproductive cough with shortness of breath, difficulty emptying her bladder, dark urine, and fatigue for 5 days. She saw a gynecologist about 4 days ago and tells me that she had a uterine biopsy done due to abnormal bleeding. She was also told that she might have a bladder infection so she has been on Flagyl and Macrobid for several days with no improvement. She also complains of urinary retention and says she is uncomfortable not being able to empty her bladder.  History [name] [name] has a past medical history of Depression; Anxiety; Hypertension; IBS (irritable bowel syndrome); Migraine, unspecified, without mention of intractable migraine without mention of status migrainosus; and Tachycardia, unspecified.   She has past surgical history that includes Tubal ligation; Bilateral myofascial release for recurrent plantar faciitis; and Colonoscopy with polypectomy (2016).   Her family history includes Alzheimer's disease in her maternal grandmother and mother; Asthma in her mother; Cancer in her mother; Diabetes in her father; Heart attack (age of onset: 18) in her father; Heart failure in her father; Hyperlipidemia in her father; Hypertension in her father; Pancreatitis in her father; Transient ischemic attack in her mother.She reports that she has never smoked. She has never used smokeless tobacco. She reports that she drinks alcohol. She reports that she does not use illicit drugs.  Outpatient Prescriptions Prior to Visit  Medication Sig Dispense Refill  . DULoxetine (CYMBALTA) 60 MG capsule Take 1 capsule (60 mg total) by mouth daily. 90 capsule 3  . metoprolol tartrate (LOPRESSOR) 25 MG tablet Take 1 tablet (25 mg total) by mouth 2 (two) times daily. 180 tablet 3  .  enalapril-hydrochlorothiazide (VASERETIC) 10-25 MG tablet Take 1 tablet by mouth daily. 90 tablet 3   No facility-administered medications prior to visit.    ROS Review of Systems  Constitutional: Positive for fever, chills, diaphoresis, activity change, appetite change and fatigue. Negative for unexpected weight change.  HENT: Negative.   Eyes: Negative.   Respiratory: Positive for cough and shortness of breath. Negative for choking, chest tightness, wheezing and stridor.   Cardiovascular: Negative.  Negative for chest pain, palpitations and leg swelling.  Gastrointestinal: Positive for nausea, abdominal pain and constipation. Negative for vomiting, diarrhea, blood in stool, abdominal distention, anal bleeding and rectal pain.  Endocrine: Negative.   Genitourinary: Positive for frequency, decreased urine volume and difficulty urinating. Negative for dysuria, urgency, hematuria, flank pain, vaginal bleeding, vaginal discharge, enuresis and vaginal pain.  Musculoskeletal: Negative.  Negative for back pain and neck pain.  Skin: Negative.  Negative for color change and rash.  Allergic/Immunologic: Negative.   Neurological: Negative.  Negative for dizziness, tremors, syncope, speech difficulty, weakness, light-headedness, numbness and headaches.  Hematological: Negative.  Negative for adenopathy. Does not bruise/bleed easily.  Psychiatric/Behavioral: Negative.     Objective:  BP 116/70 mmHg  Pulse 96  Temp(Src) 100.6 F (38.1 C) (Oral)  Resp 16  Ht 5\' 6"  (1.676 m)  Wt 196 lb (88.905 kg)  BMI 31.65 kg/m2  SpO2 95%  LMP  (LMP Unknown)  Physical Exam  Constitutional: She is oriented to person, place, and time. She appears well-developed.  Non-toxic appearance. She does not have a sickly appearance. She appears ill. She appears distressed.  HENT:  Mouth/Throat: Oropharynx is clear  and moist. No oropharyngeal exudate.  Eyes: Conjunctivae are normal. Right eye exhibits no discharge. Left  eye exhibits no discharge. No scleral icterus.  Neck: Normal range of motion. Neck supple. No JVD present. No tracheal deviation present. No thyromegaly present.  Cardiovascular: Normal rate, regular rhythm, normal heart sounds and intact distal pulses.  Exam reveals no gallop and no friction rub.   No murmur heard. Pulmonary/Chest: Effort normal and breath sounds normal. No stridor. No respiratory distress. She has no wheezes. She has no rales. She exhibits no tenderness.  Abdominal: Soft. Normal appearance. She exhibits no shifting dullness, no distension, no pulsatile liver, no fluid wave, no abdominal bruit, no ascites, no pulsatile midline mass and no mass. Bowel sounds are decreased. There is no hepatosplenomegaly, splenomegaly or hepatomegaly. There is tenderness in the right lower quadrant, epigastric area, periumbilical area and left upper quadrant. There is CVA tenderness (right side). There is no rigidity, no rebound, no guarding, no tenderness at McBurney's point and negative Murphy's sign. No hernia. Hernia confirmed negative in the ventral area.  Musculoskeletal: Normal range of motion. She exhibits no edema.  Lymphadenopathy:    She has no cervical adenopathy.  Neurological: She is oriented to person, place, and time.  Skin: Skin is warm and dry. No rash noted. No erythema. No pallor.  Psychiatric: She has a normal mood and affect. Her behavior is normal. Judgment and thought content normal.  Vitals reviewed.  Lab Results  Component Value Date   WBC 9.5 07/03/2015   HGB 15.0 07/03/2015   HCT 43.6 07/03/2015   PLT 264.0 07/03/2015   GLUCOSE 133* 07/03/2015   CHOL 191 01/20/2015   TRIG 120.0 01/20/2015   HDL 37.90* 01/20/2015   LDLCALC 129* 01/20/2015   ALT 234* 07/03/2015   AST 163* 07/03/2015   NA 133* 07/03/2015   K 3.0* 07/03/2015   CL 92* 07/03/2015   CREATININE 0.66 07/03/2015   BUN 7 07/03/2015   CO2 32 07/03/2015   TSH 1.77 01/20/2015   HGBA1C 5.6 01/23/2015      Assessment & Plan:   [name] [name] was seen today for cough and abdominal pain.  Diagnoses and all orders for this visit:  Dysuria- urinalysis was positive for white blood cells, I await the results of the urine culture and will treat if indicated. -     POCT urinalysis dipstick -     Urine Culture; Future -     CT Abdomen Pelvis W Contrast; Future  Generalized abdominal tenderness without rebound tenderness- her labs show a moderate increase in her LFTs, CBC is remarkable for slight increase in the white blood cell count and a shift to the left, her lipase is normal. Plain films show stool burden, CT scan of the abdomen was done to screen for abscess and it is negative. Will treat for constipation and see if this helps improve for abdominal pain. She may also have irritable bowel syndrome so will keep an eye out for development of that. -     Lipase; Future -     CBC with Differential/Platelet; Future -     Comprehensive metabolic panel; Future -     DG Abd Acute W/Chest; Future -     CT Abdomen Pelvis W Contrast; Future  Elevated LFTs- this may be related to the recent course of antibiotics, CT of the liver/pancreas, gallbladder is normal today, I've asked her to return in about a week or 2 to recheck the liver enzymes at that time I  will screen her for viral hepatitis.    CT Abdomen Pelvis W Contrast; Future  Essential hypertension- her blood pressure is soft today and she has a chronic cough so I've asked her to stop the ACE inhibitor. Also she has hyponatremia, hypochloremia, and hypokalemia today so we'll hold her diuretic as well. Will treat the low potassium level with an oral potassium supplement. -     potassium chloride SA (K-DUR,KLOR-CON) 20 MEQ tablet; Take 1 tablet (20 mEq total) by mouth 2 (two) times daily.  Hypokalemia- this is caused by the diuretic therapy, will hold the diuretics for now will replace with oral potassium supplement.  -     potassium chloride SA  (K-DUR,KLOR-CON) 20 MEQ tablet; Take 1 tablet (20 mEq total) by mouth 2 (two) times daily.  Other constipation- this may be idiopathic constipation but I'm also concerned she might have IBS with constipation so will start treating with Amitiza.  -     lubiprostone (AMITIZA) 24 MCG capsule; Take 1 capsule (24 mcg total) by mouth 2 (two) times daily with a meal.   After she completed her CT scan I got a call from the CT department to tell me the patient cannot empty her bladder and she needed to have something done. I've asked him to send her to the emergency room to have a urinary catheter placed.  I have discontinued Ms. Fielding's enalapril-hydrochlorothiazide. I am also having her start on potassium chloride SA and lubiprostone. Additionally, I am having her maintain her metoprolol tartrate, DULoxetine, metroNIDAZOLE, and nitrofurantoin.  Meds ordered this encounter  Medications  . metroNIDAZOLE (FLAGYL) 500 MG tablet    Sig: TK 1 T PO  BID    Refill:  0  . nitrofurantoin (MACRODANTIN) 100 MG capsule    Sig: TK ONE C PO  Q 6 H FOR 7 DAYS    Refill:  0  . potassium chloride SA (K-DUR,KLOR-CON) 20 MEQ tablet    Sig: Take 1 tablet (20 mEq total) by mouth 2 (two) times daily.    Dispense:  60 tablet    Refill:  2  . lubiprostone (AMITIZA) 24 MCG capsule    Sig: Take 1 capsule (24 mcg total) by mouth 2 (two) times daily with a meal.    Dispense:  180 capsule    Refill:  1     Follow-up: Return in about 1 day (around 07/04/2015).  Scarlette Calico, MD

## 2015-07-03 NOTE — Patient Instructions (Signed)

## 2015-07-04 ENCOUNTER — Encounter: Payer: Self-pay | Admitting: Internal Medicine

## 2015-07-04 LAB — URINE CULTURE
Colony Count: NO GROWTH
Organism ID, Bacteria: NO GROWTH

## 2015-07-05 ENCOUNTER — Encounter: Payer: Self-pay | Admitting: Internal Medicine

## 2015-07-19 ENCOUNTER — Encounter: Payer: Self-pay | Admitting: Internal Medicine

## 2015-07-19 ENCOUNTER — Ambulatory Visit (INDEPENDENT_AMBULATORY_CARE_PROVIDER_SITE_OTHER): Payer: Commercial Managed Care - PPO | Admitting: Internal Medicine

## 2015-07-19 ENCOUNTER — Other Ambulatory Visit (INDEPENDENT_AMBULATORY_CARE_PROVIDER_SITE_OTHER): Payer: Commercial Managed Care - PPO

## 2015-07-19 VITALS — BP 118/70 | HR 69 | Temp 98.6°F | Resp 16 | Ht 66.0 in | Wt 199.0 lb

## 2015-07-19 DIAGNOSIS — R7989 Other specified abnormal findings of blood chemistry: Secondary | ICD-10-CM

## 2015-07-19 DIAGNOSIS — R945 Abnormal results of liver function studies: Principal | ICD-10-CM

## 2015-07-19 DIAGNOSIS — E876 Hypokalemia: Secondary | ICD-10-CM

## 2015-07-19 LAB — COMPREHENSIVE METABOLIC PANEL
ALT: 37 U/L — ABNORMAL HIGH (ref 0–35)
AST: 24 U/L (ref 0–37)
Albumin: 4.3 g/dL (ref 3.5–5.2)
Alkaline Phosphatase: 57 U/L (ref 39–117)
BUN: 11 mg/dL (ref 6–23)
CO2: 33 mEq/L — ABNORMAL HIGH (ref 19–32)
Calcium: 10 mg/dL (ref 8.4–10.5)
Chloride: 98 mEq/L (ref 96–112)
Creatinine, Ser: 0.72 mg/dL (ref 0.40–1.20)
GFR: 89.52 mL/min (ref >60.00)
Glucose, Bld: 93 mg/dL (ref 70–99)
Potassium: 3.3 mEq/L — ABNORMAL LOW (ref 3.5–5.1)
Sodium: 138 mEq/L (ref 135–145)
Total Bilirubin: 0.6 mg/dL (ref 0.2–1.2)
Total Protein: 7.6 g/dL (ref 6.0–8.3)

## 2015-07-19 LAB — GAMMA GT: GGT: 69 U/L — ABNORMAL HIGH (ref 7–51)

## 2015-07-19 LAB — MAGNESIUM: Magnesium: 2.2 mg/dL (ref 1.5–2.5)

## 2015-07-19 NOTE — Progress Notes (Signed)
Pre visit review using our clinic review tool, if applicable. No additional management support is needed unless otherwise documented below in the visit note. 

## 2015-07-19 NOTE — Patient Instructions (Signed)

## 2015-07-19 NOTE — Progress Notes (Signed)
Subjective:  Patient ID: [name] [name], female    DOB: 09-26-1960  Age: 55 y.o. MRN: [id number]  CC: Abdominal Pain   HPI [name] [name] presents for follow-up after recent episode of abdominal pain, severe constipation, and elevated liver enzymes. She tells me all of her symptoms have resolved. Her bowel movements have been normal with Amitiza. She denies abdominal pain, nausea, vomiting, loss of appetite, or weight loss.  Outpatient Prescriptions Prior to Visit  Medication Sig Dispense Refill  . DULoxetine (CYMBALTA) 60 MG capsule Take 1 capsule (60 mg total) by mouth daily. 90 capsule 3  . lubiprostone (AMITIZA) 24 MCG capsule Take 1 capsule (24 mcg total) by mouth 2 (two) times daily with a meal. 180 capsule 1  . metoprolol tartrate (LOPRESSOR) 25 MG tablet Take 1 tablet (25 mg total) by mouth 2 (two) times daily. 180 tablet 3  . potassium chloride SA (K-DUR,KLOR-CON) 20 MEQ tablet Take 1 tablet (20 mEq total) by mouth 2 (two) times daily. 60 tablet 2  . metroNIDAZOLE (FLAGYL) 500 MG tablet TK 1 T PO  BID  0  . nitrofurantoin (MACRODANTIN) 100 MG capsule TK ONE C PO  Q 6 H FOR 7 DAYS  0   No facility-administered medications prior to visit.    ROS Review of Systems  Constitutional: Negative.  Negative for fever, chills, diaphoresis, appetite change and fatigue.  HENT: Negative.   Eyes: Negative.   Respiratory: Negative.  Negative for cough, choking, chest tightness, shortness of breath and stridor.   Cardiovascular: Negative.  Negative for chest pain, palpitations and leg swelling.  Gastrointestinal: Negative.  Negative for nausea, vomiting, abdominal pain, diarrhea and constipation.  Endocrine: Negative.   Genitourinary: Negative.  Negative for dysuria, hematuria, flank pain and difficulty urinating.  Musculoskeletal: Negative.  Negative for back pain.  Skin: Negative.  Negative for rash.  Allergic/Immunologic: Negative.   Neurological: Negative.  Negative for dizziness,  weakness and light-headedness.  Hematological: Negative.  Negative for adenopathy. Does not bruise/bleed easily.  Psychiatric/Behavioral: Negative.     Objective:  BP 118/70 mmHg  Pulse 69  Temp(Src) 98.6 F (37 C) (Oral)  Resp 16  Ht 5\' 6"  (1.676 m)  Wt 199 lb (90.266 kg)  BMI 32.13 kg/m2  SpO2 96%  LMP  (LMP Unknown)  BP Readings from Last 3 Encounters:  07/19/15 118/70  07/03/15 116/70  01/05/15 124/72    Wt Readings from Last 3 Encounters:  07/19/15 199 lb (90.266 kg)  07/03/15 196 lb (88.905 kg)  01/05/15 194 lb (87.998 kg)    Physical Exam  Constitutional: She is oriented to person, place, and time. No distress.  HENT:  Mouth/Throat: Oropharynx is clear and moist. No oropharyngeal exudate.  Eyes: Conjunctivae are normal. Right eye exhibits no discharge. Left eye exhibits no discharge. No scleral icterus.  Neck: Normal range of motion. Neck supple. No JVD present. No tracheal deviation present. No thyromegaly present.  Cardiovascular: Normal rate, regular rhythm, normal heart sounds and intact distal pulses.  Exam reveals no gallop and no friction rub.   No murmur heard. Pulmonary/Chest: Effort normal and breath sounds normal. No stridor. No respiratory distress. She has no wheezes. She has no rales. She exhibits no tenderness.  Abdominal: Soft. Bowel sounds are normal. She exhibits no distension and no mass. There is no tenderness. There is no rebound and no guarding.  Musculoskeletal: Normal range of motion. She exhibits no edema or tenderness.  Lymphadenopathy:    She has no cervical  adenopathy.  Neurological: She is oriented to person, place, and time.  Skin: Skin is warm and dry. No rash noted. She is not diaphoretic. No erythema. No pallor.  Vitals reviewed.   Lab Results  Component Value Date   WBC 9.5 07/03/2015   HGB 15.0 07/03/2015   HCT 43.6 07/03/2015   PLT 264.0 07/03/2015   GLUCOSE 93 07/19/2015   CHOL 191 01/20/2015   TRIG 120.0 01/20/2015    HDL 37.90* 01/20/2015   LDLCALC 129* 01/20/2015   ALT 37* 07/19/2015   AST 24 07/19/2015   NA 138 07/19/2015   K 3.3* 07/19/2015   CL 98 07/19/2015   CREATININE 0.72 07/19/2015   BUN 11 07/19/2015   CO2 33* 07/19/2015   TSH 1.77 01/20/2015   HGBA1C 5.6 01/23/2015    Ct Abdomen Pelvis W Contrast  07/03/2015  CLINICAL DATA:  Extreme abdominal pain with fever, unable to void completely, feels terrible, pain for couple weeks but unbearable on last few days, elevated LFTs, EXAM: CT ABDOMEN AND PELVIS WITH CONTRAST TECHNIQUE: Multidetector CT imaging of the abdomen and pelvis was performed using the standard protocol following bolus administration of intravenous contrast. Sagittal and coronal MPR images reconstructed from axial data set. CONTRAST:  139mL ISOVUE-300 IOPAMIDOL (ISOVUE-300) INJECTION 61% IV. Dilute oral contrast. COMPARISON:  None FINDINGS: Lung bases clear. Small cyst inferior pole LEFT kidney image 40. Liver, gallbladder, spleen, pancreas, kidneys, and adrenal glands otherwise normal appearance. Small splenule at splenic hilum. Normal appendix, bladder, ureters, and uterus. Probable BILATERAL ovarian cysts measuring 2.3 x 1.7 cm LEFT image 65 and 3.8 x 2.1 cm RIGHT image 70. Mildly increased stool in colon. Stomach and bowel loops otherwise normal appearance. No mass, adenopathy, free air, free fluid or inflammatory process otherwise seen. Osseous structures unremarkable. IMPRESSION: Probable small BILATERAL ovarian cysts. Otherwise negative exam. Electronically Signed   By: Lavonia Dana M.D.   On: 07/03/2015 16:54   Dg Abd Acute W/chest  07/03/2015  CLINICAL DATA:  Generalized abdominal pain. Unable to urinate. Lower abdominal pressure, nausea. Shortness of breath. EXAM: DG ABDOMEN ACUTE W/ 1V CHEST COMPARISON:  None. FINDINGS: Large stool burden throughout the colon. The bowel gas pattern is normal. There is no evidence of free intraperitoneal air. No suspicious radio-opaque calculi or  other significant radiographic abnormality is seen. Heart size and mediastinal contours are within normal limits. Both lungs are clear. IMPRESSION: Large stool burden.  No acute findings. Electronically Signed   By: Rolm Baptise M.D.   On: 07/03/2015 12:27    Assessment & Plan:   [name] [name] was seen today for abdominal pain.  Diagnoses and all orders for this visit:  Elevated LFTs- her liver enzymes are nearly normal now, the only abnormal finding is a very slightly elevated ALT, she is negative for infection with hepatitis A/B/C, I've asked her to return for hepatitis A and B vaccines, her GGT is slightly elevated so I'm concerned that there may be some alcohol abuse, -     Comprehensive metabolic panel; Future -     Gamma GT; Future -     Hepatitis A antibody, total; Future -     Hepatitis B core antibody, total; Future -     Hepatitis B surface antibody; Future -     Hepatitis C antibody; Future -     Hepatitis panel, acute; Future  Hypokalemia- her potassium level has improved but is still slightly low at 3.3, I've asked her to be more compliant with the potassium  replacement therapy. -     Magnesium; Future -     Comprehensive metabolic panel; Future   I have discontinued [name] [name]'s metroNIDAZOLE and nitrofurantoin. I am also having her maintain her metoprolol tartrate, DULoxetine, potassium chloride SA, and lubiprostone.  No orders of the defined types were placed in this encounter.     Follow-up: Return in about 6 months (around 01/19/2016).  [name] Calico, MD

## 2015-07-20 ENCOUNTER — Encounter: Payer: Self-pay | Admitting: Internal Medicine

## 2015-07-20 LAB — HEPATITIS B SURFACE ANTIBODY,QUALITATIVE: Hep B S Ab: NEGATIVE

## 2015-07-20 LAB — HEPATITIS A ANTIBODY, TOTAL: Hep A Total Ab: NONREACTIVE

## 2015-07-20 LAB — HEPATITIS PANEL, ACUTE
HCV Ab: NEGATIVE
Hep A IgM: NONREACTIVE
Hep B C IgM: NONREACTIVE
Hepatitis B Surface Ag: NEGATIVE

## 2015-07-20 LAB — HEPATITIS B CORE ANTIBODY, TOTAL: Hep B Core Total Ab: NONREACTIVE

## 2015-07-20 LAB — HEPATITIS C ANTIBODY: HCV Ab: NEGATIVE

## 2015-07-26 ENCOUNTER — Other Ambulatory Visit: Payer: Self-pay | Admitting: Obstetrics and Gynecology

## 2015-08-02 ENCOUNTER — Encounter (HOSPITAL_COMMUNITY)
Admission: RE | Admit: 2015-08-02 | Discharge: 2015-08-02 | Disposition: A | Discharge status: Home / Self Care | Payer: Commercial Managed Care - PPO | Source: Ambulatory Visit | Attending: Obstetrics and Gynecology | Admitting: Obstetrics and Gynecology

## 2015-08-02 ENCOUNTER — Encounter (HOSPITAL_COMMUNITY): Payer: Self-pay

## 2015-08-02 ENCOUNTER — Other Ambulatory Visit: Payer: Self-pay

## 2015-08-02 DIAGNOSIS — I10 Essential (primary) hypertension: Secondary | ICD-10-CM | POA: Diagnosis not present

## 2015-08-02 DIAGNOSIS — K589 Irritable bowel syndrome without diarrhea: Secondary | ICD-10-CM | POA: Diagnosis not present

## 2015-08-02 DIAGNOSIS — Z01812 Encounter for preprocedural laboratory examination: Secondary | ICD-10-CM | POA: Diagnosis present

## 2015-08-02 DIAGNOSIS — R000 Tachycardia, unspecified: Secondary | ICD-10-CM | POA: Diagnosis not present

## 2015-08-02 DIAGNOSIS — Z01810 Encounter for preprocedural cardiovascular examination: Secondary | ICD-10-CM | POA: Insufficient documentation

## 2015-08-02 DIAGNOSIS — N83299 Other ovarian cyst, unspecified side: Secondary | ICD-10-CM | POA: Diagnosis not present

## 2015-08-02 DIAGNOSIS — F329 Major depressive disorder, single episode, unspecified: Secondary | ICD-10-CM | POA: Diagnosis not present

## 2015-08-02 DIAGNOSIS — F419 Anxiety disorder, unspecified: Secondary | ICD-10-CM | POA: Insufficient documentation

## 2015-08-02 HISTORY — DX: Abnormal levels of other serum enzymes: R74.8

## 2015-08-02 HISTORY — DX: Cardiac arrhythmia, unspecified: I49.9

## 2015-08-02 LAB — BASIC METABOLIC PANEL
Anion gap: 9 (ref 5–15)
BUN: 11 mg/dL (ref 6–20)
CO2: 30 mmol/L (ref 22–32)
Calcium: 9.9 mg/dL (ref 8.9–10.3)
Chloride: 99 mmol/L — ABNORMAL LOW (ref 101–111)
Creatinine, Ser: 0.64 mg/dL (ref 0.44–1.00)
GFR calc Af Amer: >60 mL/min (ref >60)
GFR calc non Af Amer: >60 mL/min (ref >60)
Glucose, Bld: 87 mg/dL (ref 65–99)
Potassium: 4.1 mmol/L (ref 3.5–5.1)
Sodium: 138 mmol/L (ref 135–145)

## 2015-08-02 LAB — CBC
HCT: 43 % (ref 36.0–46.0)
Hemoglobin: 14.7 g/dL (ref 12.0–15.0)
MCH: 28.9 pg (ref 26.0–34.0)
MCHC: 34.2 g/dL (ref 30.0–36.0)
MCV: 84.5 fL (ref 78.0–100.0)
Platelets: 322 10*3/uL (ref 150–400)
RBC: 5.09 MIL/uL (ref 3.87–5.11)
RDW: 14.4 % (ref 11.5–15.5)
WBC: 9.6 10*3/uL (ref 4.0–10.5)

## 2015-08-02 NOTE — Patient Instructions (Signed)
Your procedure is scheduled on:08/09/15  Enter through the Main Entrance at :9:15 am Pick up desk phone and dial [phone] and inform us of your arrival.  Please call [phone] if you have any problems the morning of surgery.  Remember: Do not eat food or drink liquids, including water, after midnight:Tuesday   You may brush your teeth the morning of surgery.  Take these meds the morning of surgery with a sip of water:all am nmeds except NO Amitiza  DO NOT wear jewelry, eye make-up, lipstick,body lotion, or dark fingernail polish.  (Polished toes are ok) You may wear deodorant.  If you are to be admitted after surgery, leave suitcase in car until your room has been assigned. Patients discharged on the day of surgery will not be allowed to drive home. Wear loose fitting, comfortable clothes for your ride home.

## 2015-08-02 NOTE — Pre-Procedure Instructions (Signed)
Left note in [facility] office for Dr. Imogene Burn with info about patients history of elevated liver enzymes. Dr. Jillyn Hidden busy with epidurals so unable to speak with her directly.

## 2015-08-08 NOTE — H&P (Signed)
55 y.o. yo complains of intermittent sharp pelvic pain which radiates to lower back, worse on Rt. side, started a few months ago; Pt. states she is having difficulty walking and having some bloating and nausea; also having Rt. leg, possible sciatica which started 5 weeks ago; Pt. also having spotting after intercourse but no intercourse since November. In October had postcoital bleeding that originally was thought to be from yeast. This continued despite treatment but no longer having SA. Then began with sharp shooting pains in abdomen. Pain increased and moved to lower back. Leg is painful to walk and in groin area. Now having bloating. Before pants were getting tighter but now worse. Not comfortable with sitting. Now having more indigestion and not eating as much; early satiety- feeling very full. January had last cycle. Recently had bleeding with watery pink and some bleeding after [place]. May be like period? Periods were very heavy before. Now having fatigue and low energy recently.  On [place] she has complex ovarian cysts- 7x5x4 cm; EM 1.3 cm; RO 3.6 cm with 2 cm complex ovarian cyst with irregular shape and shaggy interior; LO 3.35 with simple cyst and solid cyst 2 cm just anterior with multiple calcifications; no increased blood flow to any; no free fluid. Small 1 cm fibroids. Given symptoms and complexity of cysts, will move to surgery sooner than later. CA 125 was normal.  Pt elects to have hysterectomy as well.  Pt had recent UTI and was treated.  After treatment, pt alerted me to the fact that she is having a lot of leaking with exercise and cough.  On exam she had leaking with valsalva.  Although a nullip, she does do a lot of heavy lifting and may have to modulate that if has TVT.  She has some mild urge.    Past Medical History  Diagnosis Date  . Depression   . Anxiety   . Hypertension   . IBS (irritable bowel syndrome)   . Migraine, unspecified, without mention of intractable migraine without  mention of status migrainosus   . Tachycardia, unspecified     on BBloc  . Dysrhythmia     bigeminy  . Elevated liver enzymes 2017    undiagnosed and levels recently were more normal per pt   Past Surgical History  Procedure Laterality Date  . Tubal ligation    . Bilateral myofascial release for recurrent plantar faciitis    . Colonoscopy with polypectomy  2016    Repeat 2021  . Carpal tunnel release      Social History   Social History  . Marital Status: Married    Spouse Name: N/A  . Number of Children: 0  . Years of Education: 13   Occupational History  . sales    Social History Main Topics  . Smoking status: Never Smoker   . Smokeless tobacco: Never Used  . Alcohol Use: Yes     Comment:  Typically 1 wine or beer per month  . Drug Use: No  . Sexual Activity:    Partners: Male   Other Topics Concern  . Not on file   Social History Narrative   HSG. 1 year [employer] [place]. Married '86-divorced after 70yrs; remarried  '96. No children, 2 step children. 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 Marriage is stable.    No current facility-administered medications on file prior to encounter.   Current Outpatient Prescriptions on File Prior to Encounter  Medication Sig Dispense Refill  . DULoxetine (CYMBALTA) 60 MG capsule Take 1 capsule (60 mg total) by mouth daily. 90 capsule 3  . metoprolol tartrate (LOPRESSOR) 25 MG tablet Take 1 tablet (25 mg total) by mouth 2 (two) times daily. 180 tablet 3    Allergies  Allergen Reactions  . Codeine     REACTION: causes nausea and vomiting    @VITALS2 @  Lungs: clear to ascultation Cor:  RRR Abdomen:  soft, nontender, nondistended. Ex:  no cords, erythema Pelvic:  Vulva: no masses, no atrophy, no lesions.  Vagina: no tenderness, no erythema, no abnormal vaginal discharge, no vesicle(s) or ulcers, no rectocele.  Cervix: grossly normal, no discharge, no cervical motion tenderness,  sample taken for a Pap smear.  Uterus: normal size (7), normal shape, midline, no uterine prolapse, non-tender.  Bladder/Urethra: normal meatus, no urethral discharge, no urethral mass, bladder non distended, Urethra not well supported- >45 with leak with valsalva. Mild cystocele. Adnexa/Parametria: no parametrial tenderness, no parametrial mass, no adnexal tenderness, no ovarian mass  A:  For Robotic TLH/BSO for complex ovarian cysts.  Also having SUI and mild cystocele- for TVT/A repair.   P: All risks, benefits and alternatives d/w patient and she desires to proceed.  Patient has undergone a modified bowel prep and will receive preop antibiotics and SCDs during the operation.     Patsye Sullivant A

## 2015-08-09 ENCOUNTER — Ambulatory Visit (HOSPITAL_COMMUNITY)
Admission: RE | Admit: 2015-08-09 | Discharge: 2015-08-10 | Disposition: A | Discharge status: Home / Self Care | Payer: Commercial Managed Care - PPO | Source: Ambulatory Visit | Attending: Obstetrics and Gynecology | Admitting: Obstetrics and Gynecology

## 2015-08-09 ENCOUNTER — Ambulatory Visit (HOSPITAL_COMMUNITY): Payer: Commercial Managed Care - PPO | Admitting: Anesthesiology

## 2015-08-09 ENCOUNTER — Encounter (HOSPITAL_COMMUNITY)
Admission: RE | Disposition: A | Discharge status: Home / Self Care | Payer: Self-pay | Source: Ambulatory Visit | Attending: Obstetrics and Gynecology

## 2015-08-09 ENCOUNTER — Encounter (HOSPITAL_COMMUNITY): Payer: Self-pay | Admitting: Certified Registered Nurse Anesthetist

## 2015-08-09 DIAGNOSIS — Z885 Allergy status to narcotic agent status: Secondary | ICD-10-CM | POA: Insufficient documentation

## 2015-08-09 DIAGNOSIS — N83292 Other ovarian cyst, left side: Secondary | ICD-10-CM | POA: Diagnosis not present

## 2015-08-09 DIAGNOSIS — G43909 Migraine, unspecified, not intractable, without status migrainosus: Secondary | ICD-10-CM | POA: Diagnosis not present

## 2015-08-09 DIAGNOSIS — N393 Stress incontinence (female) (male): Secondary | ICD-10-CM | POA: Diagnosis not present

## 2015-08-09 DIAGNOSIS — K589 Irritable bowel syndrome without diarrhea: Secondary | ICD-10-CM | POA: Diagnosis not present

## 2015-08-09 DIAGNOSIS — R000 Tachycardia, unspecified: Secondary | ICD-10-CM | POA: Insufficient documentation

## 2015-08-09 DIAGNOSIS — I10 Essential (primary) hypertension: Secondary | ICD-10-CM | POA: Diagnosis not present

## 2015-08-09 DIAGNOSIS — D251 Intramural leiomyoma of uterus: Secondary | ICD-10-CM | POA: Insufficient documentation

## 2015-08-09 DIAGNOSIS — N8311 Corpus luteum cyst of right ovary: Secondary | ICD-10-CM | POA: Insufficient documentation

## 2015-08-09 DIAGNOSIS — N83209 Unspecified ovarian cyst, unspecified side: Secondary | ICD-10-CM | POA: Diagnosis present

## 2015-08-09 DIAGNOSIS — F329 Major depressive disorder, single episode, unspecified: Secondary | ICD-10-CM | POA: Diagnosis not present

## 2015-08-09 DIAGNOSIS — Z9889 Other specified postprocedural states: Secondary | ICD-10-CM

## 2015-08-09 DIAGNOSIS — F419 Anxiety disorder, unspecified: Secondary | ICD-10-CM | POA: Diagnosis not present

## 2015-08-09 DIAGNOSIS — N72 Inflammatory disease of cervix uteri: Secondary | ICD-10-CM | POA: Diagnosis not present

## 2015-08-09 DIAGNOSIS — N8110 Cystocele, unspecified: Secondary | ICD-10-CM | POA: Diagnosis not present

## 2015-08-09 HISTORY — PX: BLADDER SUSPENSION: SHX72

## 2015-08-09 HISTORY — DX: Nausea with vomiting, unspecified: R11.2

## 2015-08-09 HISTORY — DX: Nausea with vomiting, unspecified: Z98.890

## 2015-08-09 HISTORY — PX: CYSTOCELE REPAIR: SHX163

## 2015-08-09 HISTORY — PX: ROBOTIC ASSISTED TOTAL HYSTERECTOMY WITH BILATERAL SALPINGO OOPHERECTOMY: SHX6086

## 2015-08-09 HISTORY — PX: CYSTOSCOPY: SHX5120

## 2015-08-09 SURGERY — ROBOTIC ASSISTED TOTAL HYSTERECTOMY WITH BILATERAL SALPINGO OOPHORECTOMY
Anesthesia: General | Site: Vagina

## 2015-08-09 MED ORDER — ONDANSETRON HCL 4 MG/2ML IJ SOLN: 4.0000 mg | Freq: Four times a day (QID) | INTRAMUSCULAR | Status: DC | PRN

## 2015-08-09 MED ORDER — ENALAPRIL MALEATE 10 MG PO TABS
10.0000 mg | ORAL_TABLET | Freq: Every day | ORAL | Status: DC
  Administered 2015-08-10: 10 mg via ORAL
  Filled 2015-08-09: qty 1

## 2015-08-09 MED ORDER — MENTHOL 3 MG MT LOZG: 1.0000 | LOZENGE | OROMUCOSAL | Status: DC | PRN

## 2015-08-09 MED ORDER — LIDOCAINE HCL (CARDIAC) 20 MG/ML IV SOLN
INTRAVENOUS | Status: DC | PRN
  Administered 2015-08-09: 50 mg via INTRAVENOUS

## 2015-08-09 MED ORDER — HYDROMORPHONE HCL 1 MG/ML IJ SOLN
INTRAMUSCULAR | Status: AC
  Filled 2015-08-09: qty 1

## 2015-08-09 MED ORDER — ESTRADIOL 0.1 MG/GM VA CREA
TOPICAL_CREAM | VAGINAL | Status: AC
  Filled 2015-08-09: qty 42.5

## 2015-08-09 MED ORDER — SODIUM CHLORIDE 0.9 % IV SOLN
INTRAVENOUS | Status: DC | PRN
  Administered 2015-08-09: 60 mL

## 2015-08-09 MED ORDER — CEFAZOLIN SODIUM-DEXTROSE 2-4 GM/100ML-% IV SOLN
2.0000 g | INTRAVENOUS | Status: AC
  Administered 2015-08-09: 2 g via INTRAVENOUS

## 2015-08-09 MED ORDER — HYDROCHLOROTHIAZIDE 25 MG PO TABS
25.0000 mg | ORAL_TABLET | Freq: Every day | ORAL | Status: DC
  Administered 2015-08-10: 25 mg via ORAL
  Filled 2015-08-09: qty 1

## 2015-08-09 MED ORDER — ROPIVACAINE HCL 5 MG/ML IJ SOLN
INTRAMUSCULAR | Status: AC
  Filled 2015-08-09: qty 30

## 2015-08-09 MED ORDER — OXYCODONE-ACETAMINOPHEN 5-325 MG PO TABS
1.0000 | ORAL_TABLET | ORAL | Status: DC | PRN
  Administered 2015-08-09: 1 via ORAL
  Administered 2015-08-10: 2 via ORAL
  Administered 2015-08-10: 1 via ORAL
  Administered 2015-08-10: 2 via ORAL
  Filled 2015-08-09 (×4): qty 2

## 2015-08-09 MED ORDER — DULOXETINE HCL 60 MG PO CPEP
60.0000 mg | ORAL_CAPSULE | Freq: Every day | ORAL | Status: DC
  Administered 2015-08-10: 60 mg via ORAL
  Filled 2015-08-09: qty 1

## 2015-08-09 MED ORDER — SCOPOLAMINE 1 MG/3DAYS TD PT72
MEDICATED_PATCH | TRANSDERMAL | Status: AC
  Administered 2015-08-09: 1.5 mg via TRANSDERMAL
  Filled 2015-08-09: qty 1

## 2015-08-09 MED ORDER — KETOROLAC TROMETHAMINE 30 MG/ML IJ SOLN
30.0000 mg | Freq: Four times a day (QID) | INTRAMUSCULAR | Status: DC
Start: 2015-08-09 — End: 2015-08-09

## 2015-08-09 MED ORDER — ONDANSETRON HCL 4 MG/2ML IJ SOLN
INTRAMUSCULAR | Status: AC
  Filled 2015-08-09: qty 2

## 2015-08-09 MED ORDER — KETOROLAC TROMETHAMINE 30 MG/ML IJ SOLN
INTRAMUSCULAR | Status: AC
  Filled 2015-08-09: qty 1

## 2015-08-09 MED ORDER — IBUPROFEN 800 MG PO TABS
800.0000 mg | ORAL_TABLET | Freq: Three times a day (TID) | ORAL | Status: DC | PRN
Start: 2015-08-09 — End: 2015-08-10

## 2015-08-09 MED ORDER — ESTRADIOL 0.1 MG/GM VA CREA
TOPICAL_CREAM | VAGINAL | Status: DC | PRN
Start: 2015-08-09 — End: 2015-08-09
  Administered 2015-08-09: 1 via VAGINAL

## 2015-08-09 MED ORDER — LACTATED RINGERS IR SOLN
Status: DC | PRN
  Administered 2015-08-09: 3000 mL

## 2015-08-09 MED ORDER — DEXAMETHASONE SODIUM PHOSPHATE 4 MG/ML IJ SOLN
INTRAMUSCULAR | Status: AC
  Filled 2015-08-09: qty 1

## 2015-08-09 MED ORDER — MIDAZOLAM HCL 2 MG/2ML IJ SOLN
INTRAMUSCULAR | Status: AC
  Filled 2015-08-09: qty 2

## 2015-08-09 MED ORDER — FENTANYL CITRATE (PF) 100 MCG/2ML IJ SOLN
INTRAMUSCULAR | Status: DC | PRN
  Administered 2015-08-09 (×7): 50 ug via INTRAVENOUS

## 2015-08-09 MED ORDER — SCOPOLAMINE 1 MG/3DAYS TD PT72
1.0000 | MEDICATED_PATCH | Freq: Once | TRANSDERMAL | Status: DC
  Administered 2015-08-09: 1.5 mg via TRANSDERMAL

## 2015-08-09 MED ORDER — LUBIPROSTONE 24 MCG PO CAPS
24.0000 ug | ORAL_CAPSULE | Freq: Two times a day (BID) | ORAL | Status: DC
  Administered 2015-08-10: 24 ug via ORAL
  Filled 2015-08-09 (×2): qty 1

## 2015-08-09 MED ORDER — FENTANYL CITRATE (PF) 250 MCG/5ML IJ SOLN
INTRAMUSCULAR | Status: AC
  Filled 2015-08-09: qty 5

## 2015-08-09 MED ORDER — DEXAMETHASONE SODIUM PHOSPHATE 4 MG/ML IJ SOLN
INTRAMUSCULAR | Status: DC | PRN
  Administered 2015-08-09: 4 mg via INTRAVENOUS

## 2015-08-09 MED ORDER — SUGAMMADEX SODIUM 200 MG/2ML IV SOLN
INTRAVENOUS | Status: DC | PRN
  Administered 2015-08-09: 200 mg via INTRAVENOUS

## 2015-08-09 MED ORDER — ONDANSETRON HCL 4 MG PO TABS: 4.0000 mg | ORAL_TABLET | Freq: Four times a day (QID) | ORAL | Status: DC | PRN

## 2015-08-09 MED ORDER — EPHEDRINE 5 MG/ML INJ
INTRAVENOUS | Status: AC
  Filled 2015-08-09: qty 10

## 2015-08-09 MED ORDER — ONDANSETRON HCL 4 MG/2ML IJ SOLN
INTRAMUSCULAR | Status: DC | PRN
  Administered 2015-08-09: 4 mg via INTRAVENOUS

## 2015-08-09 MED ORDER — HYDROMORPHONE HCL 1 MG/ML IJ SOLN
0.2500 mg | INTRAMUSCULAR | Status: DC | PRN
  Administered 2015-08-09 (×3): 0.5 mg via INTRAVENOUS

## 2015-08-09 MED ORDER — LIDOCAINE-EPINEPHRINE (PF) 1 %-1:200000 IJ SOLN
INTRAMUSCULAR | Status: AC
  Filled 2015-08-09: qty 30

## 2015-08-09 MED ORDER — SODIUM CHLORIDE 0.9 % IJ SOLN
INTRAMUSCULAR | Status: AC
  Filled 2015-08-09: qty 50

## 2015-08-09 MED ORDER — STERILE WATER FOR IRRIGATION IR SOLN
Status: DC | PRN
  Administered 2015-08-09: 1000 mL

## 2015-08-09 MED ORDER — ENALAPRIL-HYDROCHLOROTHIAZIDE 10-25 MG PO TABS: 1.0000 | ORAL_TABLET | Freq: Every day | ORAL | Status: DC

## 2015-08-09 MED ORDER — MIDAZOLAM HCL 5 MG/5ML IJ SOLN
INTRAMUSCULAR | Status: DC | PRN
  Administered 2015-08-09: 2 mg via INTRAVENOUS

## 2015-08-09 MED ORDER — POTASSIUM CHLORIDE CRYS ER 20 MEQ PO TBCR
20.0000 meq | EXTENDED_RELEASE_TABLET | Freq: Two times a day (BID) | ORAL | Status: DC
  Administered 2015-08-09 – 2015-08-10 (×2): 20 meq via ORAL
  Filled 2015-08-09 (×2): qty 1

## 2015-08-09 MED ORDER — ROCURONIUM BROMIDE 100 MG/10ML IV SOLN
INTRAVENOUS | Status: DC | PRN
  Administered 2015-08-09: 50 mg via INTRAVENOUS
  Administered 2015-08-09 (×2): 10 mg via INTRAVENOUS

## 2015-08-09 MED ORDER — LIDOCAINE-EPINEPHRINE 0.5 %-1:200000 IJ SOLN
INTRAMUSCULAR | Status: AC
  Filled 2015-08-09: qty 1

## 2015-08-09 MED ORDER — ARTIFICIAL TEARS OP OINT
TOPICAL_OINTMENT | OPHTHALMIC | Status: AC
  Filled 2015-08-09: qty 3.5

## 2015-08-09 MED ORDER — LACTATED RINGERS IV SOLN
INTRAVENOUS | Status: DC
  Administered 2015-08-09: 13:00:00 via INTRAVENOUS
  Administered 2015-08-09: 125 mL/h via INTRAVENOUS
  Administered 2015-08-09: 10:00:00 via INTRAVENOUS

## 2015-08-09 MED ORDER — PROPOFOL 10 MG/ML IV BOLUS
INTRAVENOUS | Status: AC
  Filled 2015-08-09: qty 20

## 2015-08-09 MED ORDER — PROCHLORPERAZINE EDISYLATE 5 MG/ML IJ SOLN: 10.0000 mg | Freq: Once | INTRAMUSCULAR | Status: DC

## 2015-08-09 MED ORDER — SUGAMMADEX SODIUM 200 MG/2ML IV SOLN
INTRAVENOUS | Status: AC
  Filled 2015-08-09: qty 2

## 2015-08-09 MED ORDER — LIDOCAINE HCL (PF) 1 % IJ SOLN
INTRAMUSCULAR | Status: AC
  Filled 2015-08-09: qty 5

## 2015-08-09 MED ORDER — FENTANYL CITRATE (PF) 100 MCG/2ML IJ SOLN
INTRAMUSCULAR | Status: AC
  Filled 2015-08-09: qty 2

## 2015-08-09 MED ORDER — KETOROLAC TROMETHAMINE 30 MG/ML IJ SOLN
INTRAMUSCULAR | Status: DC | PRN
  Administered 2015-08-09: 30 mg via INTRAVENOUS

## 2015-08-09 MED ORDER — ROCURONIUM BROMIDE 100 MG/10ML IV SOLN
INTRAVENOUS | Status: AC
  Filled 2015-08-09: qty 1

## 2015-08-09 MED ORDER — PROPOFOL 10 MG/ML IV BOLUS
INTRAVENOUS | Status: DC | PRN
  Administered 2015-08-09: 200 mg via INTRAVENOUS

## 2015-08-09 MED ORDER — LIDOCAINE-EPINEPHRINE (PF) 1 %-1:200000 IJ SOLN
INTRAMUSCULAR | Status: DC | PRN
  Administered 2015-08-09: 8 mL

## 2015-08-09 MED ORDER — EPHEDRINE SULFATE 50 MG/ML IJ SOLN
INTRAMUSCULAR | Status: DC | PRN
  Administered 2015-08-09: 10 mg via INTRAVENOUS

## 2015-08-09 MED ORDER — HYDROMORPHONE HCL 1 MG/ML IJ SOLN
INTRAMUSCULAR | Status: AC
  Administered 2015-08-09: 0.5 mg via INTRAVENOUS
  Filled 2015-08-09: qty 1

## 2015-08-09 SURGICAL SUPPLY — 88 items
APL SKNCLS STERI-STRIP NONHPOA (GAUZE/BANDAGES/DRESSINGS) ×4
BARRIER ADHS 3X4 INTERCEED (GAUZE/BANDAGES/DRESSINGS) ×5 IMPLANT
BENZOIN TINCTURE PRP APPL 2/3 (GAUZE/BANDAGES/DRESSINGS) ×5 IMPLANT
BLADE SURG 15 STRL LF C SS BP (BLADE) ×4 IMPLANT
BLADE SURG 15 STRL SS (BLADE) ×5
BRR ADH 4X3 ABS CNTRL BYND (GAUZE/BANDAGES/DRESSINGS) ×4
CANISTER SUCT 3000ML (MISCELLANEOUS) ×5 IMPLANT
CATH BONANNO SUPRAPUBIC 14G (CATHETERS) IMPLANT
CATH ROBINSON RED A/P 16FR (CATHETERS) ×5 IMPLANT
CLOTH BEACON ORANGE TIMEOUT ST (SAFETY) ×5 IMPLANT
CONT PATH 16OZ SNAP LID 3702 (MISCELLANEOUS) ×5 IMPLANT
COVER BACK TABLE 60X90IN (DRAPES) ×10 IMPLANT
COVER TIP SHEARS 8 DVNC (MISCELLANEOUS) ×4 IMPLANT
COVER TIP SHEARS 8MM DA VINCI (MISCELLANEOUS) ×1
DECANTER SPIKE VIAL GLASS SM (MISCELLANEOUS) ×7 IMPLANT
DEFOGGER SCOPE WARMER CLEARIFY (MISCELLANEOUS) ×5 IMPLANT
DRSG COVADERM PLUS 2X2 (GAUZE/BANDAGES/DRESSINGS) ×20 IMPLANT
DRSG OPSITE POSTOP 3X4 (GAUZE/BANDAGES/DRESSINGS) ×5 IMPLANT
DURAPREP 26ML APPLICATOR (WOUND CARE) ×5 IMPLANT
ELECT REM PT RETURN 9FT ADLT (ELECTROSURGICAL) ×5
ELECTRODE REM PT RTRN 9FT ADLT (ELECTROSURGICAL) ×4 IMPLANT
GAUZE PACKING 1 X5 YD ST (GAUZE/BANDAGES/DRESSINGS) IMPLANT
GAUZE PACKING 2X5 YD STRL (GAUZE/BANDAGES/DRESSINGS) ×5 IMPLANT
GAUZE VASELINE 3X9 (GAUZE/BANDAGES/DRESSINGS) IMPLANT
GLOVE BIO SURGEON STRL SZ 6.5 (GLOVE) ×5 IMPLANT
GLOVE BIO SURGEON STRL SZ7 (GLOVE) ×10 IMPLANT
GLOVE BIOGEL PI IND STRL 6.5 (GLOVE) ×4 IMPLANT
GLOVE BIOGEL PI IND STRL 7.0 (GLOVE) ×12 IMPLANT
GLOVE BIOGEL PI INDICATOR 6.5 (GLOVE) ×1
GLOVE BIOGEL PI INDICATOR 7.0 (GLOVE) ×3
GLOVE ECLIPSE 6.5 STRL STRAW (GLOVE) ×15 IMPLANT
GOWN STRL REUS W/TWL LRG LVL3 (GOWN DISPOSABLE) ×10 IMPLANT
GYRUS RUMI II 2.5CM BLUE (DISPOSABLE) ×5
GYRUS RUMI II 3.5CM BLUE (DISPOSABLE)
GYRUS RUMI II 4.0CM BLUE (DISPOSABLE)
KIT ACCESSORY DA VINCI DISP (KITS) ×1
KIT ACCESSORY DVNC DISP (KITS) ×4 IMPLANT
LEGGING LITHOTOMY PAIR STRL (DRAPES) ×6 IMPLANT
LIQUID BAND (GAUZE/BANDAGES/DRESSINGS) ×5 IMPLANT
MANIPULATOR UTERINE 4.5 ZUMI (MISCELLANEOUS) IMPLANT
NEEDLE HYPO 22GX1.5 SAFETY (NEEDLE) ×5 IMPLANT
NEEDLE INSUFFLATION 120MM (ENDOMECHANICALS) ×5 IMPLANT
NS IRRIG 1000ML POUR BTL (IV SOLUTION) ×5 IMPLANT
OCCLUDER COLPOPNEUMO (BALLOONS) ×2 IMPLANT
PACK ROBOT WH (CUSTOM PROCEDURE TRAY) ×5 IMPLANT
PACK ROBOTIC GOWN (GOWN DISPOSABLE) ×5 IMPLANT
PACK VAGINAL WOMENS (CUSTOM PROCEDURE TRAY) ×5 IMPLANT
PAD PREP 24X48 CUFFED NSTRL (MISCELLANEOUS) ×10 IMPLANT
PAD TRENDELENBURG POSITION (MISCELLANEOUS) ×5 IMPLANT
PLUG CATH AND CAP STER (CATHETERS) ×5 IMPLANT
POUCH LAPAROSCOPIC INSTRUMENT (MISCELLANEOUS) ×6 IMPLANT
RUMI II 3.0CM BLUE KOH-EFFICIE (DISPOSABLE) IMPLANT
RUMI II GYRUS 2.5CM BLUE (DISPOSABLE) IMPLANT
RUMI II GYRUS 3.5CM BLUE (DISPOSABLE) IMPLANT
RUMI II GYRUS 4.0CM BLUE (DISPOSABLE) IMPLANT
SET CYSTO W/LG BORE CLAMP LF (SET/KITS/TRAYS/PACK) ×5 IMPLANT
SET IRRIG TUBING LAPAROSCOPIC (IRRIGATION / IRRIGATOR) ×5 IMPLANT
SET TRI-LUMEN FLTR TB AIRSEAL (TUBING) ×1 IMPLANT
SLING TRANS VAGINAL TAPE (Sling) ×1 IMPLANT
SLING UTERINE/ABD GYNECARE TVT (Sling) ×8 IMPLANT
SPONGE SURGIFOAM ABS GEL 12-7 (HEMOSTASIS) ×1 IMPLANT
STRIP CLOSURE SKIN 1/2X4 (GAUZE/BANDAGES/DRESSINGS) ×5 IMPLANT
SUT DVC VLOC 180 0 12IN GS21 (SUTURE)
SUT VIC AB 0 CT1 27 (SUTURE)
SUT VIC AB 0 CT1 27XBRD ANBCTR (SUTURE) IMPLANT
SUT VIC AB 2-0 CT2 27 (SUTURE) ×10 IMPLANT
SUT VIC AB 2-0 SH 27 (SUTURE)
SUT VIC AB 2-0 SH 27XBRD (SUTURE) IMPLANT
SUT VIC AB 2-0 UR6 27 (SUTURE) ×5 IMPLANT
SUT VICRYL RAPIDE 3 0 (SUTURE) ×10 IMPLANT
SUT VLOC 180 0 9IN  GS21 (SUTURE) ×1
SUT VLOC 180 0 9IN GS21 (SUTURE) IMPLANT
SUTURE DVC VLC 180 0 12IN GS21 (SUTURE) IMPLANT
SYR 50ML LL SCALE MARK (SYRINGE) ×5 IMPLANT
SYSTEM CONVERTIBLE TROCAR (TROCAR) IMPLANT
TIP RUMI ORANGE 6.7MMX12CM (TIP) IMPLANT
TIP UTERINE 5.1X6CM LAV DISP (MISCELLANEOUS) IMPLANT
TIP UTERINE 6.7X10CM GRN DISP (MISCELLANEOUS) IMPLANT
TIP UTERINE 6.7X6CM WHT DISP (MISCELLANEOUS) IMPLANT
TIP UTERINE 6.7X8CM BLUE DISP (MISCELLANEOUS) ×1 IMPLANT
TOWEL OR 17X24 6PK STRL BLUE (TOWEL DISPOSABLE) ×10 IMPLANT
TRAY FOLEY CATH SILVER 14FR (SET/KITS/TRAYS/PACK) ×5 IMPLANT
TROCAR DILATING TIP 12MM 150MM (ENDOMECHANICALS) ×5 IMPLANT
TROCAR DISP BLADELESS 8 DVNC (TROCAR) ×4 IMPLANT
TROCAR DISP BLADELESS 8MM (TROCAR) ×1
TROCAR PORT AIRSEAL 5X120 (TROCAR) ×1 IMPLANT
TROCAR XCEL 12X100 BLDLESS (ENDOMECHANICALS) IMPLANT
WATER STERILE IRR 1000ML POUR (IV SOLUTION) ×15 IMPLANT

## 2015-08-09 NOTE — Anesthesia Preprocedure Evaluation (Signed)
Anesthesia Evaluation  Patient identified by MRN, date of birth, ID band Patient awake    Reviewed: Allergy & Precautions, NPO status , Patient's Chart, lab work & pertinent test results  Airway Mallampati: II  TM Distance: >3 FB Neck ROM: Full    Dental no notable dental hx.    Pulmonary neg pulmonary ROS,    Pulmonary exam normal breath sounds clear to auscultation       Cardiovascular hypertension, Pt. on medications and Pt. on home beta blockers Normal cardiovascular exam Rhythm:Regular Rate:Normal     Neuro/Psych negative neurological ROS  negative psych ROS   GI/Hepatic negative GI ROS, Neg liver ROS,   Endo/Other  negative endocrine ROS  Renal/GU negative Renal ROS  negative genitourinary   Musculoskeletal negative musculoskeletal ROS (+)   Abdominal   Peds negative pediatric ROS (+)  Hematology negative hematology ROS (+)   Anesthesia Other Findings   Reproductive/Obstetrics negative OB ROS                             Anesthesia Physical Anesthesia Plan  ASA: II  Anesthesia Plan: General   Post-op Pain Management:    Induction: Intravenous  Airway Management Planned: Oral ETT  Additional Equipment:   Intra-op Plan:   Post-operative Plan: Extubation in OR  Informed Consent: I have reviewed the patients History and Physical, chart, labs and discussed the procedure including the risks, benefits and alternatives for the proposed anesthesia with the patient or authorized representative who has indicated his/her understanding and acceptance.   Dental advisory given  Plan Discussed with: CRNA and Surgeon  Anesthesia Plan Comments:         Anesthesia Quick Evaluation

## 2015-08-09 NOTE — Anesthesia Procedure Notes (Signed)
Procedure Name: Intubation Date/Time: 08/09/2015 11:40 AM Performed by: Jonna Munro Pre-anesthesia Checklist: Patient identified, Emergency Drugs available, Suction available, Patient being monitored and Timeout performed Patient Re-evaluated:Patient Re-evaluated prior to inductionOxygen Delivery Method: Circle system utilized Preoxygenation: Pre-oxygenation with 100% oxygen Intubation Type: IV induction Ventilation: Mask ventilation without difficulty Laryngoscope Size: Mac and 3 Grade View: Grade I Tube type: Oral Tube size: 7.0 mm Number of attempts: 1 Airway Equipment and Method: Stylet Placement Confirmation: ETT inserted through vocal cords under direct vision,  positive ETCO2 and breath sounds checked- equal and bilateral Secured at: 22 cm Tube secured with: Tape Dental Injury: Teeth and Oropharynx as per pre-operative assessment

## 2015-08-09 NOTE — Anesthesia Postprocedure Evaluation (Signed)
Anesthesia Post Note  Patient: [name] [name]  Procedure(s) Performed: Procedure(s) (LRB): ROBOTIC ASSISTED TOTAL HYSTERECTOMY WITH BILATERAL SALPINGO OOPHORECTOMY (Bilateral) CYSTOSCOPY (N/A) TRANSVAGINAL TAPE (TVT) PROCEDURE (N/A) ANTERIOR REPAIR (CYSTOCELE)  Patient location during evaluation: PACU Anesthesia Type: General Level of consciousness: sedated Pain management: pain level controlled Vital Signs Assessment: post-procedure vital signs reviewed and stable Respiratory status: spontaneous breathing Cardiovascular status: stable Postop Assessment: no signs of nausea or vomiting Anesthetic complications: no     Last Vitals:  Filed Vitals:   08/09/15 1515 08/09/15 1530  BP: 123/66 123/60  Pulse: 90 93  Temp:    Resp: 27 16    Last Pain:  Filed Vitals:   08/09/15 1536  PainSc: 4    Pain Goal: Patients Stated Pain Goal: 4 (08/09/15 0935)               Dawson

## 2015-08-09 NOTE — Transfer of Care (Signed)
Immediate Anesthesia Transfer of Care Note  Patient: [name] [name]  Procedure(s) Performed: Procedure(s): ROBOTIC ASSISTED TOTAL HYSTERECTOMY WITH BILATERAL SALPINGO OOPHORECTOMY (Bilateral) CYSTOSCOPY (N/A) TRANSVAGINAL TAPE (TVT) PROCEDURE (N/A) ANTERIOR REPAIR (CYSTOCELE)  Patient Location: PACU  Anesthesia Type:General  Level of Consciousness: awake, alert  and oriented  Airway & Oxygen Therapy: Patient Spontanous Breathing and Patient connected to nasal cannula oxygen  Post-op Assessment: Report given to RN and Post -op Vital signs reviewed and stable  Post vital signs: Reviewed and stable  Last Vitals:  Filed Vitals:   08/09/15 0923  BP: 115/88  Pulse: 71  Temp: 36.8 C  Resp: 18    Last Pain: There were no vitals filed for this visit.    Patients Stated Pain Goal: 4 ([id number])  Complications: none

## 2015-08-09 NOTE — Brief Op Note (Signed)
08/09/2015  1:58 PM  PATIENT:  [name] [name]  55 y.o. female  PRE-OPERATIVE DIAGNOSIS: Complex OVARIAN CYSTs, fibroids, SUI  POST-OPERATIVE DIAGNOSIS:  Same. PROCEDURE:  Procedure(s): ROBOTIC ASSISTED TOTAL HYSTERECTOMY WITH BILATERAL SALPINGO OOPHORECTOMY (Bilateral) CYSTOSCOPY (N/A) TRANSVAGINAL TAPE (TVT) PROCEDURE (N/A) ANTERIOR REPAIR (CYSTOCELE)  SURGEON:  Surgeon(s) and Role:  ASSISTANTS: Allyn Kenner, DO   ANESTHESIA:   general  EBL:  Total I/O In: 1000 [I.V.:1000] Out: 250 [Urine:150; Blood:100]   LOCAL MEDICATIONS USED:  LIDOCAINE and ropivicaine  SPECIMEN:  No Specimen and Source of Specimen:  Uterus, tubes, ovaries  DISPOSITION OF SPECIMEN:  PATHOLOGY  COUNTS:  YES  TOURNIQUET:  * No tourniquets in log *  DICTATION: .Note written in EPIC  PLAN OF CARE: Admit for overnight observation  PATIENT DISPOSITION:  PACU - hemodynamically stable.   Delay start of Pharmacological VTE agent (>24hrs) due to surgical blood loss or risk of bleeding: not applicable  Complications:  None.  Findings: 9 weeks size uterus.  Ovaries were normal bilaterally.  The ureters were identified during multiple points of the case and were always out of the field of dissection.  On cystoscopy, the bladder was intact and bilateral spill was seen from each ureteral oriface.    Medications:  Ancef. Lidocaine with epi, gel foam, ropivicaine intra peritoneal.  Technique:  After adequate anesthesia was achieved the patient was positioned, prepped and draped in usual sterile fashion.  A speculum was placed in the vagina and the cervix dilated with pratt dilators.  The 8 cm Rumi and 2.5 cm Koh ring were assembled and placed in proper fashion.  The  Speculum was removed and the bladder catheterized with a foley.    Attention was turned to the abdomen where a 1 cm incision was made above the umbilicus.  The veress needle was inserted without aspiration of bowel contents or blood.  The long  12 mm trocar was placed and the other three trocar sites were marked out, all approximately 10 cm from each other and the camera. Two 8.5 mm trocars were placed on either side of the camera port.  A 5 mm assistant port was placed 3 cm above the plane of the other trocars.  All trocars were inserted under direct visualization of the camera.  The patient was placed in trendelenburg and then the Robot docked.  The PK forceps were placed on arm 2 and the Hot shears on arm 1 and introduced under direct visualization of the camera.   I then broke scrub and sat down at the console.  The above findings were noted and the ureters identified well out of the field of dissection.  The right round ligament was identified and cauterized with the PK.  It  was then divided with the PK cautery and shears.  The peritoneum parallel to the IP ligament was carefully incised and the posterior broad ligament was then incised under the right IP ligament with care to be above the ureter.  The R IP ligament was cauterized with the PK and then incised with the shears.  The Broad and cardinal ligaments were then cauterized against the cervix to the level of the Koh ring, securing the uterine artery.  Each pedicle was then incised with the shears.  The anterior leaf was then incised at the reflection of the vessico-uterine junction and the lateral bladder retracted inferiorly after the round ligament had been divided with the PK forceps.  The round ligament was cauterized with the PK and  divided with the shears;  then the left IP ligament divided with the PK forceps and the scissors in the same manner as the right. The broad and cardinal ligaments were then cauterized on the left in the same way.   At the level of the internal os, the uterine arteries were bilaterally cauterized with the PK.  The ureters were identified well out of the field of dissection.   The anterior peritoneum was tented up and incised with the monopolar and the bladder  retracted inferiorly.  The vesicovaginal fascia was incised with the monopolar shears and then pushed inferiorly off the vagina to about one cm below the Koh ring.   The uterus was amputated at the level of the reflection of the vagina onto the cervix with monopolar cautery and then removed through the vagina.  The pedicles were checked with the gas pressure down and found to be hemostatic.  The vaginal cuff was closed with a running stitch of 2-0 V-Loc.  The Robot was undocked and ropivicaine placed inside the peritoneal cavity at the cuff.    Attention was then turned to the vagina after scrubbing back in.   A foley remained in place in the urethra and the apex of the cystocele grasped with two allises.  Allises were used to grasp the vaginal mucosa in the midline superior to inferior just below the urethral opening.  The mucosa was injected with 1% lidocaine with epi in the midline and a scalpel used to incise the vaginal mucosa vertically.  Allises were used to retract the vaginal mucosa as the vesico-vaginal fascia was removed from the mucosa with blunt and sharp dissection and adequate room midurethral to pelvic floor bilaterally was developed.  Two small puncture incisions were then made two cm from midline just above the pubic symphysis.  The abdominal needles from the Gyne[employer] TVT set were introduced behind the pubic bone, through the pelvic floor and out the vagina carefully on each respective side, being careful to hug the posterior of the pubic bone.  The foley was removed and indigo carmine given.  Cystoscopy revealed excellent bilateral spill from each ureteral orifice and NO NEEDLES IN THE BLADDER.   The urethra was clear as well.  The foley was replaced and the vaginal needles attached to the abdominal needles.  The tape was pulled into place through the abdominal incisions, the sheaths removed while a kelly was in place under the mesh sling and the tape cut at the level of just below the skin.   The tape was checked and found to be tight enough and laying flat.   Gelfoam  was placed in the corners up by the pelvic floor to achieve hemostasis of some small bleeders out of reach and too close to the sling for stitches.  Once hemostasis was achieved, one mattress stitch of 0-vicryl were placed to close the vesico-vaginal fascia under the bladder.  The vaginal mucosa was trimmed and then closed with a running lock stitch of 2-0 vicryl.  A vaginal packing with estrace was placed and the patient returned to the recovery room in stable condition.     After changing, attention was turned back to the abdomen.  An additional check was made intraperitoneal to make sure we achieved hemostasis. All instruments and trocars were withdrawn from the abdomen, the abdomen desufflated and the 12 mm fascial incision closed with a figure of eight stitch of 2-0 vicryl.  The skin incisions were closed with 4-0 vicryl R and  dermabond.   The patient tolerated the procedure well and was returned to the recovery room in stable condition.

## 2015-08-09 NOTE — Progress Notes (Signed)
There has been no change in the patients history, status or exam since the history and physical.  Filed Vitals:   08/09/15 0923  BP: 115/88  Pulse: 71  Temp: 98.3 F (36.8 C)  TempSrc: Oral  Resp: 18  SpO2: 97%    Lab Results  Component Value Date   WBC 9.6 08/02/2015   HGB 14.7 08/02/2015   HCT 43.0 08/02/2015   MCV 84.5 08/02/2015   PLT 322 08/02/2015    Victor Granados A

## 2015-08-09 NOTE — Op Note (Signed)
08/09/2015  1:58 PM  PATIENT:  [name] [name]  55 y.o. female  PRE-OPERATIVE DIAGNOSIS: Complex OVARIAN CYSTs, fibroids, SUI  POST-OPERATIVE DIAGNOSIS:  Same. PROCEDURE:  Procedure(s): ROBOTIC ASSISTED TOTAL HYSTERECTOMY WITH BILATERAL SALPINGO OOPHORECTOMY (Bilateral) CYSTOSCOPY (N/A) TRANSVAGINAL TAPE (TVT) PROCEDURE (N/A) ANTERIOR REPAIR (CYSTOCELE)  SURGEON:  Surgeon(s) and Role:  ASSISTANTS: Allyn Kenner, DO   ANESTHESIA:   general  EBL:  Total I/O In: 1000 [I.V.:1000] Out: 250 [Urine:150; Blood:100]   LOCAL MEDICATIONS USED:  LIDOCAINE and ropivicaine  SPECIMEN:  No Specimen and Source of Specimen:  Uterus, tubes, ovaries  DISPOSITION OF SPECIMEN:  PATHOLOGY  COUNTS:  YES  TOURNIQUET:  * No tourniquets in log *  DICTATION: .Note written in EPIC  PLAN OF CARE: Admit for overnight observation  PATIENT DISPOSITION:  PACU - hemodynamically stable.   Delay start of Pharmacological VTE agent (>24hrs) due to surgical blood loss or risk of bleeding: not applicable  Complications:  None.  Findings: 9 weeks size uterus.  Ovaries were normal bilaterally.  The ureters were identified during multiple points of the case and were always out of the field of dissection.  On cystoscopy, the bladder was intact and bilateral spill was seen from each ureteral oriface.    Medications:  Ancef. Lidocaine with epi, gel foam, ropivicaine intra peritoneal.  Technique:  After adequate anesthesia was achieved the patient was positioned, prepped and draped in usual sterile fashion.  A speculum was placed in the vagina and the cervix dilated with pratt dilators.  The 8 cm Rumi and 2.5 cm Koh ring were assembled and placed in proper fashion.  The  Speculum was removed and the bladder catheterized with a foley.    Attention was turned to the abdomen where a 1 cm incision was made above the umbilicus.  The veress needle was inserted without aspiration of bowel contents or blood.  The long  12 mm trocar was placed and the other three trocar sites were marked out, all approximately 10 cm from each other and the camera. Two 8.5 mm trocars were placed on either side of the camera port.  A 5 mm assistant port was placed 3 cm above the plane of the other trocars.  All trocars were inserted under direct visualization of the camera.  The patient was placed in trendelenburg and then the Robot docked.  The PK forceps were placed on arm 2 and the Hot shears on arm 1 and introduced under direct visualization of the camera.   I then broke scrub and sat down at the console.  The above findings were noted and the ureters identified well out of the field of dissection.  The right round ligament was identified and cauterized with the PK.  It  was then divided with the PK cautery and shears.  The peritoneum parallel to the IP ligament was carefully incised and the posterior broad ligament was then incised under the right IP ligament with care to be above the ureter.  The R IP ligament was cauterized with the PK and then incised with the shears.  The Broad and cardinal ligaments were then cauterized against the cervix to the level of the Koh ring, securing the uterine artery.  Each pedicle was then incised with the shears.  The anterior leaf was then incised at the reflection of the vessico-uterine junction and the lateral bladder retracted inferiorly after the round ligament had been divided with the PK forceps.  The round ligament was cauterized with the PK and  divided with the shears;  then the left IP ligament divided with the PK forceps and the scissors in the same manner as the right. The broad and cardinal ligaments were then cauterized on the left in the same way.   At the level of the internal os, the uterine arteries were bilaterally cauterized with the PK.  The ureters were identified well out of the field of dissection.   The anterior peritoneum was tented up and incised with the monopolar and the bladder  retracted inferiorly.  The vesicovaginal fascia was incised with the monopolar shears and then pushed inferiorly off the vagina to about one cm below the Koh ring.   The uterus was amputated at the level of the reflection of the vagina onto the cervix with monopolar cautery and then removed through the vagina.  The pedicles were checked with the gas pressure down and found to be hemostatic.  The vaginal cuff was closed with a running stitch of 2-0 V-Loc.  The Robot was undocked and ropivicaine placed inside the peritoneal cavity at the cuff.    Attention was then turned to the vagina after scrubbing back in.   A foley remained in place in the urethra and the apex of the cystocele grasped with two allises.  Allises were used to grasp the vaginal mucosa in the midline superior to inferior just below the urethral opening.  The mucosa was injected with 1% lidocaine with epi in the midline and a scalpel used to incise the vaginal mucosa vertically.  Allises were used to retract the vaginal mucosa as the vesico-vaginal fascia was removed from the mucosa with blunt and sharp dissection and adequate room midurethral to pelvic floor bilaterally was developed.  Two small puncture incisions were then made two cm from midline just above the pubic symphysis.  The abdominal needles from the Gynecare TVT set were introduced behind the pubic bone, through the pelvic floor and out the vagina carefully on each respective side, being careful to hug the posterior of the pubic bone.  The foley was removed and indigo carmine given.  Cystoscopy revealed excellent bilateral spill from each ureteral orifice and NO NEEDLES IN THE BLADDER.   The urethra was clear as well.  The foley was replaced and the vaginal needles attached to the abdominal needles.  The tape was pulled into place through the abdominal incisions, the sheaths removed while a kelly was in place under the mesh sling and the tape cut at the level of just below the skin.   The tape was checked and found to be tight enough and laying flat.   Gelfoam  was placed in the corners up by the pelvic floor to achieve hemostasis of some small bleeders out of reach and too close to the sling for stitches.  Once hemostasis was achieved, one mattress stitch of 0-vicryl were placed to close the vesico-vaginal fascia under the bladder.  The vaginal mucosa was trimmed and then closed with a running lock stitch of 2-0 vicryl.  A vaginal packing with estrace was placed and the patient returned to the recovery room in stable condition.     After changing, attention was turned back to the abdomen.  An additional check was made intraperitoneal to make sure we achieved hemostasis. All instruments and trocars were withdrawn from the abdomen, the abdomen desufflated and the 12 mm fascial incision closed with a figure of eight stitch of 2-0 vicryl.  The skin incisions were closed with 4-0 vicryl R and  dermabond.   The patient tolerated the procedure well and was returned to the recovery room in stable condition.

## 2015-08-10 ENCOUNTER — Encounter (HOSPITAL_COMMUNITY): Payer: Self-pay | Admitting: Obstetrics and Gynecology

## 2015-08-10 DIAGNOSIS — D251 Intramural leiomyoma of uterus: Secondary | ICD-10-CM | POA: Diagnosis not present

## 2015-08-10 MED ORDER — OXYCODONE-ACETAMINOPHEN 5-325 MG PO TABS: 1.0000 | ORAL_TABLET | ORAL | Status: DC | PRN

## 2015-08-10 MED ORDER — CEFUROXIME AXETIL 250 MG PO TABS: 250.0000 mg | ORAL_TABLET | Freq: Two times a day (BID) | ORAL | Status: DC

## 2015-08-10 NOTE — Addendum Note (Signed)
Addendum  created 08/10/15 0806 by Raenette Rover, CRNA   Modules edited: Clinical Notes   Clinical Notes:  File: [id number]

## 2015-08-10 NOTE — Discharge Instructions (Signed)
Keep Foley in place- give patient leg bag and show how to change bags.  Instruct on care. voiding trial at office- call and come in at 830 am °

## 2015-08-10 NOTE — Anesthesia Postprocedure Evaluation (Signed)
Anesthesia Post Note  Patient: [name] [name]  Procedure(s) Performed: Procedure(s) (LRB): ROBOTIC ASSISTED TOTAL HYSTERECTOMY WITH BILATERAL SALPINGO OOPHORECTOMY (Bilateral) CYSTOSCOPY (N/A) TRANSVAGINAL TAPE (TVT) PROCEDURE (N/A) ANTERIOR REPAIR (CYSTOCELE)  Patient location during evaluation: [facility] Anesthesia Type: General Level of consciousness: awake, awake and alert, oriented and patient cooperative Pain management: pain level controlled Vital Signs Assessment: post-procedure vital signs reviewed and stable Respiratory status: spontaneous breathing, nonlabored ventilation and respiratory function stable Cardiovascular status: stable Postop Assessment: no headache, no backache, patient able to bend at knees and no signs of nausea or vomiting Anesthetic complications: no     Last Vitals:  Filed Vitals:   08/09/15 2128 08/10/15 0118  BP: 107/49 97/58  Pulse: 99 93  Temp: 36.9 C 36.6 C  Resp: 18 16    Last Pain:  Filed Vitals:   08/10/15 0647  PainSc: 6    Pain Goal: Patients Stated Pain Goal: 4 (08/09/15 1611)               [name] L

## 2015-08-10 NOTE — Progress Notes (Signed)
Patient is eating, ambulating, not voiding-cath in place.  Pain control is good.  BP 97/58 mmHg  Pulse 93  Temp(Src) 97.8 F (36.6 C) (Oral)  Resp 16  Ht 5\' 6"  (1.676 m)  Wt 90.266 kg (199 lb)  BMI 32.13 kg/m2  SpO2 98%  lungs:   clear to auscultation cor:    RRR Abdomen:  soft, appropriate tenderness, incisions intact and without erythema or exudate. ex:    no cords   Lab Results  Component Value Date   WBC 9.6 08/02/2015   HGB 14.7 08/02/2015   HCT 43.0 08/02/2015   MCV 84.5 08/02/2015   PLT 322 08/02/2015    A/P  Routine care.  Expect d/c per plan.

## 2015-08-10 NOTE — Discharge Summary (Addendum)
Physician Discharge Summary  Patient ID: [name] [name] MRN: [id number] DOB/AGE: February 16, 1961 55 y.o.  Admit date: 08/09/2015 Discharge date: 08/10/2015  Admission Diagnoses: complex ovarian cysts, fibroids, SUI, mild cystocele  Discharge Diagnoses: same Active Problems:   Postoperative state   Discharged Condition: good  Hospital Course:  uncomplicated Robo TLH/Salpingectomies/TVT/a repair/cysto.  Consults: None  Significant Diagnostic Studies: labs: No results found for this or any previous visit (from the past 24 hour(s)).  Treatments: surgery:  uncomplicated Robo TLH/Salpingectomies/TVT/a repair/cysto.  Discharge Exam: Blood pressure 97/58, pulse 93, temperature 97.8 F (36.6 C), temperature source Oral, resp. rate 16, height 5\' 6"  (1.676 m), weight 90.266 kg (199 lb), SpO2 98 %.   Disposition:   Discharge Instructions    Call MD for:  temperature >100.4    Complete by:  As directed      Diet - low sodium heart healthy    Complete by:  As directed      Discharge instructions    Complete by:  As directed   No driving on narcotics, no sexual activity for 2 weeks.     Increase activity slowly    Complete by:  As directed      May shower / Bathe    Complete by:  As directed   Shower, no bath for 2 weeks.     Remove dressing in 24 hours    Complete by:  As directed      Sexual Activity Restrictions    Complete by:  As directed   No sexual activity for 2 weeks.            Medication List    TAKE these medications        cefUROXime 250 MG tablet  Commonly known as:  CEFTIN  Take 1 tablet (250 mg total) by mouth 2 (two) times daily with a meal.     DULoxetine 60 MG capsule  Commonly known as:  CYMBALTA  Take 1 capsule (60 mg total) by mouth daily.     enalapril-hydrochlorothiazide 10-25 MG tablet  Commonly known as:  VASERETIC  Take 1 tablet by mouth daily.     lubiprostone 24 MCG capsule  Commonly known as:  AMITIZA  Take 1 capsule (24 mcg total) by  mouth 2 (two) times daily with a meal.     metoprolol tartrate 25 MG tablet  Commonly known as:  LOPRESSOR  Take 1 tablet (25 mg total) by mouth 2 (two) times daily.     multivitamin with minerals Tabs tablet  Take 1 tablet by mouth daily.     oxyCODONE-acetaminophen 5-325 MG tablet  Commonly known as:  PERCOCET/ROXICET  Take 1-2 tablets by mouth every 4 (four) hours as needed for severe pain (moderate to severe pain (when tolerating fluids)).     potassium chloride SA 20 MEQ tablet  Commonly known as:  K-DUR,KLOR-CON  Take 1 tablet (20 mEq total) by mouth 2 (two) times daily.           Follow-up Information    Follow up with Elloise Roark A, MD In 5 days.   Specialty:  Obstetrics and Gynecology   Why:  Tuesday for voiding trial   Contact information:   [address] [place] [address] [phone]       Signed: Sachiko Methot A 08/10/2015, 8:16 AM

## 2015-08-10 NOTE — Progress Notes (Signed)
Pt ambulated out cath care done and leg bag  Care discussed with husband and  Pt

## 2015-08-18 ENCOUNTER — Ambulatory Visit: Payer: Commercial Managed Care - PPO

## 2015-08-26 ENCOUNTER — Encounter (HOSPITAL_COMMUNITY): Payer: Self-pay | Admitting: *Deleted

## 2015-08-26 ENCOUNTER — Observation Stay (HOSPITAL_COMMUNITY)
Admission: AD | Admit: 2015-08-26 | Discharge: 2015-08-28 | Disposition: A | Discharge status: Home / Self Care | Payer: Commercial Managed Care - PPO | Source: Ambulatory Visit | Attending: Family Medicine | Admitting: Family Medicine

## 2015-08-26 DIAGNOSIS — Z90710 Acquired absence of both cervix and uterus: Secondary | ICD-10-CM | POA: Diagnosis not present

## 2015-08-26 DIAGNOSIS — T887XXA Unspecified adverse effect of drug or medicament, initial encounter: Secondary | ICD-10-CM

## 2015-08-26 DIAGNOSIS — R502 Drug induced fever: Secondary | ICD-10-CM | POA: Diagnosis not present

## 2015-08-26 DIAGNOSIS — G8929 Other chronic pain: Secondary | ICD-10-CM | POA: Insufficient documentation

## 2015-08-26 DIAGNOSIS — N3000 Acute cystitis without hematuria: Secondary | ICD-10-CM | POA: Diagnosis not present

## 2015-08-26 DIAGNOSIS — R110 Nausea: Secondary | ICD-10-CM

## 2015-08-26 DIAGNOSIS — R17 Unspecified jaundice: Secondary | ICD-10-CM | POA: Insufficient documentation

## 2015-08-26 DIAGNOSIS — T502X5A Adverse effect of carbonic-anhydrase inhibitors, benzothiadiazides and other diuretics, initial encounter: Secondary | ICD-10-CM

## 2015-08-26 DIAGNOSIS — Z885 Allergy status to narcotic agent status: Secondary | ICD-10-CM | POA: Insufficient documentation

## 2015-08-26 DIAGNOSIS — R740 Nonspecific elevation of levels of transaminase and lactic acid dehydrogenase [LDH]: Secondary | ICD-10-CM | POA: Diagnosis not present

## 2015-08-26 DIAGNOSIS — N390 Urinary tract infection, site not specified: Principal | ICD-10-CM | POA: Insufficient documentation

## 2015-08-26 DIAGNOSIS — K589 Irritable bowel syndrome without diarrhea: Secondary | ICD-10-CM | POA: Diagnosis not present

## 2015-08-26 DIAGNOSIS — R7401 Elevation of levels of liver transaminase levels: Secondary | ICD-10-CM | POA: Diagnosis present

## 2015-08-26 DIAGNOSIS — I10 Essential (primary) hypertension: Secondary | ICD-10-CM | POA: Diagnosis not present

## 2015-08-26 DIAGNOSIS — F419 Anxiety disorder, unspecified: Secondary | ICD-10-CM | POA: Insufficient documentation

## 2015-08-26 DIAGNOSIS — F329 Major depressive disorder, single episode, unspecified: Secondary | ICD-10-CM | POA: Diagnosis not present

## 2015-08-26 DIAGNOSIS — E876 Hypokalemia: Secondary | ICD-10-CM | POA: Diagnosis present

## 2015-08-26 DIAGNOSIS — Z79899 Other long term (current) drug therapy: Secondary | ICD-10-CM | POA: Diagnosis not present

## 2015-08-26 DIAGNOSIS — R7989 Other specified abnormal findings of blood chemistry: Secondary | ICD-10-CM

## 2015-08-26 DIAGNOSIS — T50905A Adverse effect of unspecified drugs, medicaments and biological substances, initial encounter: Secondary | ICD-10-CM | POA: Diagnosis present

## 2015-08-26 DIAGNOSIS — Z881 Allergy status to other antibiotic agents status: Secondary | ICD-10-CM | POA: Diagnosis not present

## 2015-08-26 DIAGNOSIS — R509 Fever, unspecified: Secondary | ICD-10-CM | POA: Diagnosis present

## 2015-08-26 DIAGNOSIS — B3749 Other urogenital candidiasis: Secondary | ICD-10-CM | POA: Diagnosis present

## 2015-08-26 DIAGNOSIS — R112 Nausea with vomiting, unspecified: Secondary | ICD-10-CM | POA: Diagnosis present

## 2015-08-26 LAB — COMPREHENSIVE METABOLIC PANEL
ALT: 376 U/L — ABNORMAL HIGH (ref 14–54)
AST: 257 U/L — ABNORMAL HIGH (ref 15–41)
Albumin: 3.6 g/dL (ref 3.5–5.0)
Alkaline Phosphatase: 99 U/L (ref 38–126)
Anion gap: 9 (ref 5–15)
BUN: 10 mg/dL (ref 6–20)
CO2: 31 mmol/L (ref 22–32)
Calcium: 9.3 mg/dL (ref 8.9–10.3)
Chloride: 95 mmol/L — ABNORMAL LOW (ref 101–111)
Creatinine, Ser: 0.72 mg/dL (ref 0.44–1.00)
GFR calc Af Amer: >60 mL/min (ref >60)
GFR calc non Af Amer: >60 mL/min (ref >60)
Glucose, Bld: 145 mg/dL — ABNORMAL HIGH (ref 65–99)
Potassium: 2.7 mmol/L — CL (ref 3.5–5.1)
Sodium: 135 mmol/L (ref 135–145)
Total Bilirubin: 3.5 mg/dL — ABNORMAL HIGH (ref 0.3–1.2)
Total Protein: 7.6 g/dL (ref 6.5–8.1)

## 2015-08-26 LAB — URINALYSIS, ROUTINE W REFLEX MICROSCOPIC
Glucose, UA: NEGATIVE mg/dL
Hgb urine dipstick: NEGATIVE
Ketones, ur: 15 mg/dL — AB
Nitrite: NEGATIVE
Protein, ur: NEGATIVE mg/dL
Specific Gravity, Urine: 1.01 (ref 1.005–1.030)
pH: 7 (ref 5.0–8.0)

## 2015-08-26 LAB — CBC
HCT: 41.2 % (ref 36.0–46.0)
Hemoglobin: 14.3 g/dL (ref 12.0–15.0)
MCH: 28.5 pg (ref 26.0–34.0)
MCHC: 34.7 g/dL (ref 30.0–36.0)
MCV: 82.2 fL (ref 78.0–100.0)
Platelets: 303 10*3/uL (ref 150–400)
RBC: 5.01 MIL/uL (ref 3.87–5.11)
RDW: 14 % (ref 11.5–15.5)
WBC: 7.5 10*3/uL (ref 4.0–10.5)

## 2015-08-26 LAB — URINE MICROSCOPIC-ADD ON

## 2015-08-26 MED ORDER — HYDROMORPHONE HCL 1 MG/ML IJ SOLN
1.0000 mg | Freq: Once | INTRAMUSCULAR | Status: AC
  Administered 2015-08-26: 1 mg via INTRAVENOUS
  Filled 2015-08-26: qty 1

## 2015-08-26 MED ORDER — LACTATED RINGERS IV BOLUS (SEPSIS)
600.0000 mL | Freq: Once | INTRAVENOUS | Status: AC
  Administered 2015-08-26: 1000 mL via INTRAVENOUS

## 2015-08-26 MED ORDER — IBUPROFEN 400 MG PO TABS
400.0000 mg | ORAL_TABLET | Freq: Four times a day (QID) | ORAL | Status: DC | PRN
  Administered 2015-08-26: 400 mg via ORAL
  Filled 2015-08-26: qty 1

## 2015-08-26 MED ORDER — ONDANSETRON HCL 4 MG/2ML IJ SOLN
4.0000 mg | Freq: Once | INTRAMUSCULAR | Status: AC
  Administered 2015-08-26: 4 mg via INTRAVENOUS
  Filled 2015-08-26: qty 2

## 2015-08-26 MED ORDER — SODIUM CHLORIDE 0.9 % IV SOLN
INTRAVENOUS | Status: DC
  Administered 2015-08-26 – 2015-08-27 (×2): via INTRAVENOUS

## 2015-08-26 MED ORDER — ADULT MULTIVITAMIN W/MINERALS CH
1.0000 | ORAL_TABLET | Freq: Every day | ORAL | Status: DC
  Administered 2015-08-27 – 2015-08-28 (×2): 1 via ORAL
  Filled 2015-08-26 (×2): qty 1

## 2015-08-26 MED ORDER — METOPROLOL TARTRATE 25 MG PO TABS
25.0000 mg | ORAL_TABLET | Freq: Two times a day (BID) | ORAL | Status: DC
  Administered 2015-08-26 – 2015-08-28 (×3): 25 mg via ORAL
  Filled 2015-08-26 (×4): qty 1

## 2015-08-26 MED ORDER — POTASSIUM CHLORIDE CRYS ER 20 MEQ PO TBCR
20.0000 meq | EXTENDED_RELEASE_TABLET | Freq: Two times a day (BID) | ORAL | Status: DC
  Administered 2015-08-26 – 2015-08-28 (×4): 20 meq via ORAL
  Filled 2015-08-26 (×4): qty 1

## 2015-08-26 MED ORDER — PHENAZOPYRIDINE HCL 100 MG PO TABS
95.0000 mg | ORAL_TABLET | Freq: Three times a day (TID) | ORAL | Status: DC | PRN
  Filled 2015-08-26: qty 1

## 2015-08-26 MED ORDER — ENOXAPARIN SODIUM 40 MG/0.4ML ~~LOC~~ SOLN
40.0000 mg | SUBCUTANEOUS | Status: DC
  Administered 2015-08-26 – 2015-08-27 (×2): 40 mg via SUBCUTANEOUS
  Filled 2015-08-26 (×2): qty 0.4

## 2015-08-26 MED ORDER — ONDANSETRON HCL 4 MG/2ML IJ SOLN
4.0000 mg | Freq: Four times a day (QID) | INTRAMUSCULAR | Status: DC | PRN
  Administered 2015-08-27: 4 mg via INTRAVENOUS
  Filled 2015-08-26: qty 2

## 2015-08-26 MED ORDER — OXYCODONE-ACETAMINOPHEN 5-325 MG PO TABS: 1.0000 | ORAL_TABLET | ORAL | Status: DC | PRN

## 2015-08-26 MED ORDER — DULOXETINE HCL 60 MG PO CPEP
60.0000 mg | ORAL_CAPSULE | Freq: Every day | ORAL | Status: DC
  Administered 2015-08-27 – 2015-08-28 (×2): 60 mg via ORAL
  Filled 2015-08-26 (×2): qty 1

## 2015-08-26 MED ORDER — ALBUTEROL SULFATE (2.5 MG/3ML) 0.083% IN NEBU: 2.5000 mg | INHALATION_SOLUTION | RESPIRATORY_TRACT | Status: DC | PRN

## 2015-08-26 MED ORDER — ONDANSETRON HCL 4 MG PO TABS: 4.0000 mg | ORAL_TABLET | Freq: Four times a day (QID) | ORAL | Status: DC | PRN

## 2015-08-26 MED ORDER — POTASSIUM CHLORIDE CRYS ER 20 MEQ PO TBCR
40.0000 meq | EXTENDED_RELEASE_TABLET | ORAL | Status: AC
  Administered 2015-08-26: 40 meq via ORAL
  Filled 2015-08-26: qty 2

## 2015-08-26 NOTE — MAU Note (Signed)
CRITICAL VALUE ALERT  Critical value received: K+ 2.7  Date of notification:  08/26/15  Time of notification:  1702  Critical value read back:Yes.    Nurse who received alert:  K.Branden Vine,RN  MD notified (1st page):  D.Lawson,CNM  Time of first page:  1702  MD notified (2nd page):  Time of second page:  Responding MD:  D.Lawson,CNM  Time MD responded:

## 2015-08-26 NOTE — Progress Notes (Signed)
CC: Fever  HPI: 55yo Caucasian female 2 weeks s/p Robotic total hysterectomy, bilateral salpingo-ophorectomy presents to [facility]U complaining of a three-day history of fever. The patient was seen in the office for her postoperative evaluation by Dr. Bobbye Charleston. During that evaluation it was felt that she had a UTI and she was started on Macrobid. Thursday morning she awakened with a fever. She has been having fevers and feeling poorly since then. At home her highest temp was 101.3. She endorses muscle aches and pains, denies vomiting or diarrhea but has felt nauseated. Denies abnormal vaginal discharge. Pt has been taking Aspirin at home for fevers  In April 2017 she was treated for BV and a UTI with Flagyl & Macrobid. Soon thereafter she was seen by her PCP for abdominal pain. She was noted to have elevated LFTs 163/234. A CT abdomen and pelvis was performed and unremarkable for liver/pancreas pathology. Hepatitis serologies were negative. Repeat LFTs 3 weeks later were normal.   Filed Vitals:   08/26/15 1558 08/26/15 1605 08/26/15 1744  BP: 108/92    Pulse: 119 94   Temp: 100.3 F (37.9 C)  98.7 F (37.1 C)  TempSrc: Oral  Oral  Resp: 18 18   SpO2:  97%    AOX3: ill appearing HEENT: sclera mildly icteric, neck supple Lungs: CTAB CV: RRR Abd: Robotic incisions healing well, abdomen soft with normal bowel sounds, no rebound, no guarding. Pelvic : Vaginal cuff intact, with normal post-op exudate.  . Results for orders placed or performed during the hospital encounter of 08/26/15 (from the past 24 hour(s))  Urinalysis, Routine w reflex microscopic (not at [facility])     Status: Abnormal   Collection Time: 08/26/15  3:31 PM  Result Value Ref Range   Color, Urine YELLOW YELLOW   APPearance CLEAR CLEAR   Specific Gravity, Urine 1.010 1.005 - 1.030   pH 7.0 5.0 - 8.0   Glucose, UA NEGATIVE NEGATIVE mg/dL   Hgb urine dipstick NEGATIVE NEGATIVE   Bilirubin Urine MODERATE (A) NEGATIVE   Ketones, ur 15 (A) NEGATIVE mg/dL   Protein, ur NEGATIVE NEGATIVE mg/dL   Nitrite NEGATIVE NEGATIVE   Leukocytes, UA SMALL (A) NEGATIVE  Urine microscopic-add on     Status: Abnormal   Collection Time: 08/26/15  3:31 PM  Result Value Ref Range   Squamous Epithelial / LPF 0-5 (A) NONE SEEN   WBC, UA 6-30 0 - 5 WBC/hpf   RBC / HPF 0-5 0 - 5 RBC/hpf   Bacteria, UA FEW (A) NONE SEEN  CBC     Status: None   Collection Time: 08/26/15  4:28 PM  Result Value Ref Range   WBC 7.5 4.0 - 10.5 K/uL   RBC 5.01 3.87 - 5.11 MIL/uL   Hemoglobin 14.3 12.0 - 15.0 g/dL   HCT 41.2 36.0 - 46.0 %   MCV 82.2 78.0 - 100.0 fL   MCH 28.5 26.0 - 34.0 pg   MCHC 34.7 30.0 - 36.0 g/dL   RDW 14.0 11.5 - 15.5 %   Platelets 303 150 - 400 K/uL  Comprehensive metabolic panel     Status: Abnormal   Collection Time: 08/26/15  4:28 PM  Result Value Ref Range   Sodium 135 135 - 145 mmol/L   Potassium 2.7 (LL) 3.5 - 5.1 mmol/L   Chloride 95 (L) 101 - 111 mmol/L   CO2 31 22 - 32 mmol/L   Glucose, Bld 145 (H) 65 - 99 mg/dL   BUN 10 6 -  20 mg/dL   Creatinine, Ser 0.72 0.44 - 1.00 mg/dL   Calcium 9.3 8.9 - 10.3 mg/dL   Total Protein 7.6 6.5 - 8.1 g/dL   Albumin 3.6 3.5 - 5.0 g/dL   AST 257 (H) 15 - 41 U/L   ALT 376 (H) 14 - 54 U/L   Alkaline Phosphatase 99 38 - 126 U/L   Total Bilirubin 3.5 (H) 0.3 - 1.2 mg/dL   GFR calc non Af Amer >60 >60 mL/min   GFR calc Af Amer >60 >60 mL/min   Anion gap 9 5 - 15   A/P: 55 yo 2 weeks post-op s/p robotic hysterectomy & BSO, now with fever 1) Fever: Suspect drug reaction. Urine culture from 08/23/2015 in office multiple organisms c/w contaminated sample.  UA in MAU only shows LE. WBC in MAU 7.5 Given normal white count intraabdominal abscess unlikely. No evidence of pyelo. 2) Case D/W Dr. [facility]. Will transfer to [facility] for further evaluation 3) IVF started and dilaudid IV given for pain

## 2015-08-26 NOTE — H&P (Addendum)
History and Physical    [name] [name] [id number] DOB: October 20, 1960 DOA: 08/26/2015  Referring MD/NP/PA: Waynard Reeds PCP: Sanda Linger, MD  Patient coming from: [facility]  Chief Complaint: Fever and nausea   HPI: [name] [name] is a 55 y.o. female with medical history significant of depression, anxiety, IBS, recent robotic total hysterectomy with b/l salpingo-oophorectomy 2 weeks ago; who presented with complaints of fever and nausea. Patient provides her own history and states that she went back for her two-week follow-up appointment with Dr. Carrington Clamp 3 days ago and at that time they did a urinalysis which revealed bacteria and blood and the patient was started on Macrobid. The following morning after taking her first dose on the Macrobid the previous night she woke up with high and cold chills. She had subjective fever she does not have a thermometer at home. She had been taking aspirin at home with intermittent temporary relief of fever symptoms. At the highest reported temperature was noted to be 101.3. Associated symptoms included headache, muscle aches, pains, and nausea. Denies any vomiting, diarrhea, or rash. Previous surgery sites have been evaluated multiple times and noted to be healing well. Patient notes similar symptoms after being prescribed Macrobid back in April which her LFTs were seen to be elevated as well at that time.  ED Course: [facility] and [facility] after being found to have elevated liver enzymes and fever. Urinalysis was still seen to be weakly positive for signs of bacteria. Patient notes that last time she took Macrobid was this morning.   Review of Systems: As per HPI otherwise 10 point review of systems negative.   Past Medical History  Diagnosis Date  . Depression   . Anxiety   . Hypertension   . IBS (irritable bowel syndrome)   . Migraine, unspecified, without mention of intractable migraine without mention of status  migrainosus   . Tachycardia, unspecified     on BBloc  . Dysrhythmia     bigeminy  . Elevated liver enzymes 2017    undiagnosed and levels recently were more normal per pt  . PONV (postoperative nausea and vomiting)     Past Surgical History  Procedure Laterality Date  . Tubal ligation    . Bilateral myofascial release for recurrent plantar faciitis    . Colonoscopy with polypectomy  2016    Repeat 2021  . Carpal tunnel release    . Robotic assisted total hysterectomy with bilateral salpingo oopherectomy Bilateral 08/09/2015    Procedure: ROBOTIC ASSISTED TOTAL HYSTERECTOMY WITH BILATERAL SALPINGO OOPHORECTOMY;  Surgeon: Carrington Clamp, MD;  Location: [facility];  Service: Gynecology;  Laterality: Bilateral;  . Cystoscopy N/A 08/09/2015    Procedure: CYSTOSCOPY;  Surgeon: Carrington Clamp, MD;  Location: [facility];  Service: Gynecology;  Laterality: N/A;  . Bladder suspension N/A 08/09/2015    Procedure: TRANSVAGINAL TAPE (TVT) PROCEDURE;  Surgeon: Carrington Clamp, MD;  Location: [facility];  Service: Gynecology;  Laterality: N/A;  . Cystocele repair  08/09/2015    Procedure: ANTERIOR REPAIR (CYSTOCELE);  Surgeon: Carrington Clamp, MD;  Location: [facility];  Service: Gynecology;;     reports that she has never smoked. She has never used smokeless tobacco. She reports that she drinks alcohol. She reports that she does not use illicit drugs.  Allergies  Allergen Reactions  . Codeine Nausea And Vomiting    REACTION: causes nausea and vomiting  . Macrobid [Nitrofurantoin] Other (See Comments)    Gets elevated liver enzymes, low  potassium, headache, nausea and generalized body aches shortly after taking meds.  This is the second time this had occurred.  . Oxycodone-Acetaminophen Itching    Family History  Problem Relation Age of Onset  . Cancer Mother     breast cancer-left mastectomy  . Asthma Mother   . Alzheimer's disease Mother   . Transient ischemic attack Mother   . Hypertension Father    . Hyperlipidemia Father   . Diabetes Father   . Heart attack Father 13    CABG  . Heart failure Father   . Pancreatitis Father   . Alzheimer's disease Maternal Grandmother     Prior to Admission medications   Medication Sig Start Date End Date Taking? Authorizing Provider  DULoxetine (CYMBALTA) 60 MG capsule Take 1 capsule (60 mg total) by mouth daily. 01/18/15  Yes Rowe Clack, MD  enalapril-hydrochlorothiazide (VASERETIC) 10-25 MG tablet Take 1 tablet by mouth daily.   Yes Historical Provider, MD  estradiol (ESTRACE) 0.5 MG tablet Take 0.5 mg by mouth daily.   Yes Historical Provider, MD  metoprolol tartrate (LOPRESSOR) 25 MG tablet Take 1 tablet (25 mg total) by mouth 2 (two) times daily. 01/05/15  Yes Hendricks Limes, MD  Multiple Vitamin (MULTIVITAMIN WITH MINERALS) TABS tablet Take 1 tablet by mouth daily.   Yes Historical Provider, MD  nitrofurantoin, macrocrystal-monohydrate, (MACROBID) 100 MG capsule Take 100 mg by mouth 2 (two) times daily.  08/23/15  Yes Historical Provider, MD  oxyCODONE-acetaminophen (PERCOCET/ROXICET) 5-325 MG tablet Take 1-2 tablets by mouth every 4 (four) hours as needed for severe pain (moderate to severe pain (when tolerating fluids)). 08/10/15  Yes Bobbye Charleston, MD  Phenazopyridine HCl (AZO TABS PO) Take 2 tablets by mouth daily.   Yes Historical Provider, MD  potassium chloride SA (K-DUR,KLOR-CON) 20 MEQ tablet Take 1 tablet (20 mEq total) by mouth 2 (two) times daily. 07/03/15  Yes Janith Lima, MD  cefUROXime (CEFTIN) 250 MG tablet Take 1 tablet (250 mg total) by mouth 2 (two) times daily with a meal. Patient not taking: Reported on 08/26/2015 08/10/15   Bobbye Charleston, MD  lubiprostone (AMITIZA) 24 MCG capsule Take 1 capsule (24 mcg total) by mouth 2 (two) times daily with a meal. Patient not taking: Reported on 07/27/2015 07/03/15   Janith Lima, MD    Physical Exam: Filed Vitals:   08/26/15 1558 08/26/15 1605 08/26/15 1744 08/26/15  1848  BP: 108/92   127/69  Pulse: 119 94  93  Temp: 100.3 F (37.9 C)  98.7 F (37.1 C)   TempSrc: Oral  Oral   Resp: _0 SpO2:  97%  96%      Constitutional: Sick appearing female NAD, calm, comfortable Filed Vitals:   08/26/15 1558 08/26/15 1605 08/26/15 1744 08/26/15 1848  BP: 108/92   127/69  Pulse: 119 94  93  Temp: 100.3 F (37.9 C)  98.7 F (37.1 C)   TempSrc: Oral  Oral   Resp: _1 SpO2:  97%  96%   Eyes: PERRL, lids and conjunctivae normal scleral icterus present ENMT: Mucous membranes are moist. Posterior pharynx clear of any exudate or lesions.Normal dentition.  Neck: normal, supple, no masses, no thyromegaly Respiratory: clear to auscultation bilaterally, no wheezing, no crackles. Normal respiratory effort. No accessory muscle use.  Cardiovascular: Regular rate and rhythm, no murmurs / rubs / gallops. No extremity edema. 2+ pedal pulses. No carotid bruits.  Abdomen: no tenderness, no masses  palpated. No hepatosplenomegaly. Bowel sounds positive.  Musculoskeletal: no clubbing / cyanosis. No joint deformity upper and lower extremities. Good ROM, no contractures. Normal muscle tone.  Skin: Robotic total abdominal hysterectomy wounds appear to be healing well Neurologic: CN 2-12 grossly intact. Sensation intact, DTR normal. Strength 5/5 in all 4.  Psychiatric: Normal judgment and insight. Alert and oriented x 3. Normal mood.     Labs on Admission: I have personally reviewed following labs and imaging studies  CBC:  Recent Labs Lab 08/26/15 1628  WBC 7.5  HGB 14.3  HCT 41.2  MCV 82.2  PLT 303   Basic Metabolic Panel:  Recent Labs Lab 08/26/15 1628  NA 135  K 2.7*  CL 95*  CO2 31  GLUCOSE 145*  BUN 10  CREATININE 0.72  CALCIUM 9.3   GFR: CrCl cannot be calculated (Unknown ideal weight.). Liver Function Tests:  Recent Labs Lab 08/26/15 1628  AST 257*  ALT 376*  ALKPHOS 99  BILITOT 3.5*  PROT 7.6  ALBUMIN 3.6   No  results for input(s): LIPASE, AMYLASE in the last 168 hours. No results for input(s): AMMONIA in the last 168 hours. Coagulation Profile: No results for input(s): INR, PROTIME in the last 168 hours. Cardiac Enzymes: No results for input(s): CKTOTAL, CKMB, CKMBINDEX, TROPONINI in the last 168 hours. BNP (last 3 results) No results for input(s): PROBNP in the last 8760 hours. HbA1C: No results for input(s): HGBA1C in the last 72 hours. CBG: No results for input(s): GLUCAP in the last 168 hours. Lipid Profile: No results for input(s): CHOL, HDL, LDLCALC, TRIG, CHOLHDL, LDLDIRECT in the last 72 hours. Thyroid Function Tests: No results for input(s): TSH, T4TOTAL, FREET4, T3FREE, THYROIDAB in the last 72 hours. Anemia Panel: No results for input(s): VITAMINB12, FOLATE, FERRITIN, TIBC, IRON, RETICCTPCT in the last 72 hours. Urine analysis:    Component Value Date/Time   COLORURINE YELLOW 08/26/2015 1531   APPEARANCEUR CLEAR 08/26/2015 1531   LABSPEC 1.010 08/26/2015 1531   PHURINE 7.0 08/26/2015 1531   GLUCOSEU NEGATIVE 08/26/2015 1531   HGBUR NEGATIVE 08/26/2015 1531   BILIRUBINUR MODERATE* 08/26/2015 1531   BILIRUBINUR neg 07/03/2015 1116   KETONESUR 15* 08/26/2015 1531   PROTEINUR NEGATIVE 08/26/2015 1531   PROTEINUR 15 07/03/2015 1116   UROBILINOGEN 1.0 07/03/2015 1116   NITRITE NEGATIVE 08/26/2015 1531   NITRITE neg 07/03/2015 1116   LEUKOCYTESUR SMALL* 08/26/2015 1531   Sepsis Labs: Recent Results (from the past 240 hour(s))  Aerobic Culture (superficial specimen)     Status: None (Preliminary result)   Collection Time: 08/26/15  4:24 PM  Result Value Ref Range Status   Specimen Description VAGINA SURGICAL SITE  Final   Special Requests NONE  Final   Gram Stain   Final    NO WBC SEEN ABUNDANT GRAM NEGATIVE RODS FEW GRAM POSITIVE RODS FEW GRAM POSITIVE COCCI IN PAIRS CONFIRMED BY LESLIE B    Culture PENDING  Incomplete   Report Status PENDING  Incomplete      Radiological Exams on Admission: No results found.  EKG: Independently reviewed.   Assessment/Plan Fever / suspected drug reaction/ Transaminitis: Given History) suspected Macrobid is the cost of the patient for her symptoms. Patient with scleral icterus and elevated liver enzymes. Suspect that this could be the possible cause of symptoms. Recent hepatitis panel negative on 5/3. Previously, had similar elevated transaminases noted after being on Macrobid on 4/17 ALT up to 234 and AST up to 163. - Admit to a MedSurg  bed - IV fluids - Check ESR CRP - Macrobid Placed on allergy list  - Check CMP in a.m.  Urinary tract infection: Present on arrival. Patient had a recent [facility] done [facility] which revealed a few bacteria, small leukocytes, ketones, and 6-30 WBCs.  - Check urine culture  - Rocephin per pharmacy  S/p robotic total abdominal hysterectomy - Continue Percocet prn   Nausea without vomiting - Zofran /Compazine prn   Hypokalemia - Give 40 mEq of potassium chloride 1 dose now - Continue to follow and replace as needed  Chronic pain - Continue duloxetine   Essential hypertension - Held enalapril-hydrochlorothiazide. Restart when able. - Continue metoprolol  DVT prophylaxis: lovenox   Code Status:  Full Family Communication: None Disposition Plan:   Consults called: None Admission status: Observation  Norval Morton MD [facility] Pager [phone]  If 7PM-7AM, please contact night-coverage [email or web address] Password [email or web address]  08/26/2015, 9:08 PM

## 2015-08-26 NOTE — MAU Note (Signed)
Slight yellowing of eyes, nausea, overall body aches, headache, weakness since Thursday after starting the macrobid po Wednesday of this week.

## 2015-08-26 NOTE — MAU Note (Signed)
Called bed placement spoke to [facility] requested bed will call me back with assignment.

## 2015-08-26 NOTE — Progress Notes (Signed)
55 year old female resents to [facility] hospital with complaint of fever 101.3 at home,workup on [facility] triage significant for low-grade temperature 100.3, otherwise stable vital signs, labs significant for elevated LFTs and hypokalemia, recently seen by GYN physician she was diagnosed with UTI started on macrobid on 08/23/2015,  patient with recent robotic hysterectomy/Salpingectomies/TVT/a repair/cysto on 5/24, discharge home on [id number] with no complication, Dr. Harrington Challenger does not think her symptoms related to her recent surgery as exam looks benign, patient urinalysis is positive, requested urine culture, (elevated LFTs may be related to tightness for Macrobid??), The patient accepted to medical surgical floor and started admission for further workup, very likely will need to start with CT abdomen and pelvis with IV contrast for further evaluation. Referring physician: Dr. Harrington Challenger [phone] Phillips Climes MD

## 2015-08-26 NOTE — MAU Note (Signed)
Pt had follow up appointment on Tuesday. Told she had some blood in her urine and started on antibiotic. Started running a fever on Wed and Thursday, Still feverish today and not feeling well very weak. Taking Asprin for fever.

## 2015-08-26 NOTE — MAU Provider Note (Signed)
History   G0 S/P robotic hyst and transvaginal tape. In with c/o malaise and fever for over a week. States takes ASA and as it wears off temp goes back up.   CSN: [id number]  Arrival date & time 08/26/15  1545   None     Chief Complaint  Patient presents with  . Post-op Problem  . Fever    HPI  Past Medical History  Diagnosis Date  . Depression   . Anxiety   . Hypertension   . IBS (irritable bowel syndrome)   . Migraine, unspecified, without mention of intractable migraine without mention of status migrainosus   . Tachycardia, unspecified     on BBloc  . Dysrhythmia     bigeminy  . Elevated liver enzymes 2017    undiagnosed and levels recently were more normal per pt  . PONV (postoperative nausea and vomiting)     Past Surgical History  Procedure Laterality Date  . Tubal ligation    . Bilateral myofascial release for recurrent plantar faciitis    . Colonoscopy with polypectomy  2016    Repeat 2021  . Carpal tunnel release    . Robotic assisted total hysterectomy with bilateral salpingo oopherectomy Bilateral 08/09/2015    Procedure: ROBOTIC ASSISTED TOTAL HYSTERECTOMY WITH BILATERAL SALPINGO OOPHORECTOMY;  Surgeon: Bobbye Charleston, MD;  Location: [facility];  Service: Gynecology;  Laterality: Bilateral;  . Cystoscopy N/A 08/09/2015    Procedure: CYSTOSCOPY;  Surgeon: Bobbye Charleston, MD;  Location: [facility];  Service: Gynecology;  Laterality: N/A;  . Bladder suspension N/A 08/09/2015    Procedure: TRANSVAGINAL TAPE (TVT) PROCEDURE;  Surgeon: Bobbye Charleston, MD;  Location: [facility];  Service: Gynecology;  Laterality: N/A;  . Cystocele repair  08/09/2015    Procedure: ANTERIOR REPAIR (CYSTOCELE);  Surgeon: Bobbye Charleston, MD;  Location: [facility];  Service: Gynecology;;    Family History  Problem Relation Age of Onset  . Cancer Mother     breast cancer-left mastectomy  . Asthma Mother   . Alzheimer's disease Mother   . Transient ischemic attack Mother   . Hypertension  Father   . Hyperlipidemia Father   . Diabetes Father   . Heart attack Father 5    CABG  . Heart failure Father   . Pancreatitis Father   . Alzheimer's disease Maternal Grandmother     Social History  Substance Use Topics  . Smoking status: Never Smoker   . Smokeless tobacco: Never Used  . Alcohol Use: Yes     Comment:  Typically 1 wine or beer per month    OB History    Gravida Para Term Preterm AB TAB SAB Ectopic Multiple Living   0 0 0 0 0 0 0 0 0 0       Review of Systems  Constitutional: Negative.   HENT: Negative.   Eyes: Negative.   Respiratory: Negative.   Cardiovascular: Negative.   Gastrointestinal: Negative.   Endocrine: Negative.   Genitourinary: Positive for vaginal pain.  Musculoskeletal: Negative.   Allergic/Immunologic: Negative.   Neurological: Negative.   Hematological: Negative.   Psychiatric/Behavioral: Negative.     Allergies  Codeine and Oxycodone-acetaminophen  Home Medications  No current outpatient prescriptions on file.  BP 108/92 mmHg  Pulse 94  Temp(Src) 100.3 F (37.9 C) (Oral)  Resp 18  SpO2 97%  LMP  (LMP Unknown)  Physical Exam  Constitutional: She is oriented to person, place, and time. She appears well-developed and well-nourished.  HENT:  Head: Normocephalic.  Eyes: Pupils are equal, round, and reactive to light.  Neck: Normal range of motion.  Cardiovascular: Normal rate, regular rhythm, normal heart sounds and intact distal pulses.   Pulmonary/Chest: Effort normal and breath sounds normal.  Abdominal: Soft. Bowel sounds are normal.  Musculoskeletal: Normal range of motion.  Neurological: She is alert and oriented to person, place, and time. She has normal reflexes.  Skin: Skin is warm and dry.  Psychiatric: She has a normal mood and affect. Her behavior is normal. Judgment and thought content normal.    MAU Course  Procedures (including critical care time)  Labs Reviewed  AEROBIC CULTURE (SUPERFICIAL  SPECIMEN)  URINALYSIS, ROUTINE W REFLEX MICROSCOPIC (NOT AT [facility])  CBC  COMPREHENSIVE METABOLIC PANEL   No results found.   No diagnosis found.    MDM  Gentle spec exam done sm amt yellow purolent drainage and culture obtained. Deferred bimanuel exam. Dr. Harrington Challenger in to assess pt.

## 2015-08-27 DIAGNOSIS — R7401 Elevation of levels of liver transaminase levels: Secondary | ICD-10-CM | POA: Diagnosis present

## 2015-08-27 DIAGNOSIS — T887XXD Unspecified adverse effect of drug or medicament, subsequent encounter: Secondary | ICD-10-CM | POA: Diagnosis not present

## 2015-08-27 DIAGNOSIS — N390 Urinary tract infection, site not specified: Secondary | ICD-10-CM | POA: Diagnosis present

## 2015-08-27 DIAGNOSIS — N3000 Acute cystitis without hematuria: Secondary | ICD-10-CM

## 2015-08-27 DIAGNOSIS — B3749 Other urogenital candidiasis: Secondary | ICD-10-CM | POA: Diagnosis present

## 2015-08-27 DIAGNOSIS — T50905A Adverse effect of unspecified drugs, medicaments and biological substances, initial encounter: Secondary | ICD-10-CM | POA: Diagnosis present

## 2015-08-27 DIAGNOSIS — R740 Nonspecific elevation of levels of transaminase and lactic acid dehydrogenase [LDH]: Secondary | ICD-10-CM

## 2015-08-27 DIAGNOSIS — R502 Drug induced fever: Secondary | ICD-10-CM

## 2015-08-27 DIAGNOSIS — R112 Nausea with vomiting, unspecified: Secondary | ICD-10-CM | POA: Diagnosis present

## 2015-08-27 DIAGNOSIS — E876 Hypokalemia: Secondary | ICD-10-CM

## 2015-08-27 LAB — CBC
HCT: 38.2 % (ref 36.0–46.0)
Hemoglobin: 12.5 g/dL (ref 12.0–15.0)
MCH: 27.6 pg (ref 26.0–34.0)
MCHC: 32.7 g/dL (ref 30.0–36.0)
MCV: 84.3 fL (ref 78.0–100.0)
Platelets: 238 10*3/uL (ref 150–400)
RBC: 4.53 MIL/uL (ref 3.87–5.11)
RDW: 13.9 % (ref 11.5–15.5)
WBC: 8.4 10*3/uL (ref 4.0–10.5)

## 2015-08-27 LAB — COMPREHENSIVE METABOLIC PANEL
ALT: 321 U/L — ABNORMAL HIGH (ref 14–54)
AST: 190 U/L — ABNORMAL HIGH (ref 15–41)
Albumin: 2.8 g/dL — ABNORMAL LOW (ref 3.5–5.0)
Alkaline Phosphatase: 83 U/L (ref 38–126)
Anion gap: 10 (ref 5–15)
BUN: 7 mg/dL (ref 6–20)
CO2: 26 mmol/L (ref 22–32)
Calcium: 8.8 mg/dL — ABNORMAL LOW (ref 8.9–10.3)
Chloride: 99 mmol/L — ABNORMAL LOW (ref 101–111)
Creatinine, Ser: 0.79 mg/dL (ref 0.44–1.00)
GFR calc Af Amer: >60 mL/min (ref >60)
GFR calc non Af Amer: >60 mL/min (ref >60)
Glucose, Bld: 157 mg/dL — ABNORMAL HIGH (ref 65–99)
Potassium: 3.2 mmol/L — ABNORMAL LOW (ref 3.5–5.1)
Sodium: 135 mmol/L (ref 135–145)
Total Bilirubin: 3.8 mg/dL — ABNORMAL HIGH (ref 0.3–1.2)
Total Protein: 5.9 g/dL — ABNORMAL LOW (ref 6.5–8.1)

## 2015-08-27 LAB — CULTURE, OB URINE

## 2015-08-27 LAB — SEDIMENTATION RATE: Sed Rate: 46 mm/hr — ABNORMAL HIGH (ref 0–22)

## 2015-08-27 LAB — MAGNESIUM: Magnesium: 1.7 mg/dL (ref 1.7–2.4)

## 2015-08-27 LAB — C-REACTIVE PROTEIN: CRP: 6.6 mg/dL — ABNORMAL HIGH (ref <1.0)

## 2015-08-27 LAB — LIPASE, BLOOD: Lipase: 24 U/L (ref 11–51)

## 2015-08-27 LAB — AMMONIA: Ammonia: 27 umol/L (ref 9–35)

## 2015-08-27 MED ORDER — SENNOSIDES-DOCUSATE SODIUM 8.6-50 MG PO TABS
1.0000 | ORAL_TABLET | Freq: Two times a day (BID) | ORAL | Status: DC | PRN
  Administered 2015-08-27: 1 via ORAL
  Filled 2015-08-27: qty 1

## 2015-08-27 MED ORDER — CEFTRIAXONE SODIUM 1 G IJ SOLR
1.0000 g | Freq: Once | INTRAMUSCULAR | Status: DC
  Filled 2015-08-27: qty 10

## 2015-08-27 MED ORDER — DEXTROSE 5 % IV SOLN
1.0000 g | Freq: Every day | INTRAVENOUS | Status: DC
  Administered 2015-08-27 (×2): 1 g via INTRAVENOUS
  Filled 2015-08-27 (×3): qty 10

## 2015-08-27 MED ORDER — ZOLPIDEM TARTRATE 5 MG PO TABS
5.0000 mg | ORAL_TABLET | Freq: Once | ORAL | Status: AC
  Administered 2015-08-27: 5 mg via ORAL
  Filled 2015-08-27: qty 1

## 2015-08-27 MED ORDER — PROCHLORPERAZINE EDISYLATE 5 MG/ML IJ SOLN
10.0000 mg | INTRAMUSCULAR | Status: DC | PRN
  Filled 2015-08-27: qty 2

## 2015-08-27 MED ORDER — POTASSIUM CHLORIDE CRYS ER 20 MEQ PO TBCR
40.0000 meq | EXTENDED_RELEASE_TABLET | Freq: Once | ORAL | Status: AC
  Administered 2015-08-27: 40 meq via ORAL
  Filled 2015-08-27: qty 2

## 2015-08-27 MED ORDER — POTASSIUM CHLORIDE IN NACL 20-0.9 MEQ/L-% IV SOLN
INTRAVENOUS | Status: DC
  Administered 2015-08-27 (×2): via INTRAVENOUS
  Filled 2015-08-27 (×3): qty 1000

## 2015-08-27 NOTE — Progress Notes (Signed)
PROGRESS NOTE   [name] [name]  NTI:[id number]  DOB: July 04, 1960  DOA: 08/26/2015 PCP: Scarlette Calico, MD Outpatient Specialists:   Hospital course: 55yo Caucasian female 2 weeks s/p Robotic total hysterectomy, bilateral salpingo-ophorectomy presents to MAU complaining of a three-day history of fever. The patient was seen in the office for her postoperative evaluation by Dr. Bobbye Charleston. During that evaluation it was felt that she had a UTI and she was started on Macrobid. Thursday morning she awakened with a fever. She has been having fevers and feeling poorly since then. At home her highest temp was 101.3. She endorses muscle aches and pains, denies vomiting or diarrhea but has felt nauseated. Denies abnormal vaginal discharge. Pt has been taking Aspirin at home for fevers  In April 2017 she was treated for BV and a UTI with Flagyl & Macrobid. Soon thereafter she was seen by her PCP for abdominal pain. She was noted to have elevated LFTs 163/234. A CT abdomen and pelvis was performed and unremarkable for liver/pancreas pathology. Hepatitis serologies were negative. Repeat LFTs 3 weeks later were normal.   Course: [facility] and transferred after being found to have elevated liver enzymes and fever. Urinalysis was still seen to be weakly positive for signs of bacteria. Patient notes that last time she took Macrobid was this morning.   Assessment & Plan:   Fever / suspected drug reaction/ Transaminitis: Given recent similar history this is suspected Macrobid is the cause of the patient for her symptoms. Patient with scleral icterus and elevated liver enzymes. Suspect that this could be the possible cause of symptoms. Recent hepatitis panel negative on 5/3. Previously, had similar elevated transaminases noted after being on Macrobid on 4/17 ALT up to 234 and AST up to 163. Then returned normal.   - Admit to a MedSurg bed - IV fluids - Elevated ESR CRP noted - Macrobid  Placed on allergy list  - Check CMP in a.m. - ambulate halls  Urinary tract infection: Present on arrival. Patient had a [facility] which revealed a few bacteria, small leukocytes, ketones, and 6-30 WBCs.  - Check urine culture  - Rocephin per pharmacy  S/p robotic total abdominal hysterectomy - Continue Percocet prn for pain  Nausea without vomiting - Zofran /Compazine prn   Hypokalemia - Give 40 mEq of potassium chloride 1 dose now - Continue to follow and replace as needed - checking magnesium  Chronic pain - Continue duloxetine   Essential hypertension - Held enalapril-hydrochlorothiazide. Restart when able. - Continue metoprolol - blood pressures have been controlled  DVT prophylaxis: lovenox  Code Status: Full Family Communication: None Disposition Plan:  Consults called: None Admission status: Observation  Antimicrobials:  ceftriaxone   Subjective: Pt says she is feeling better, she was able to eat breakfast, no pruritus, less scleral icterus  Objective: Filed Vitals:   08/26/15 1744 08/26/15 1848 08/26/15 2323 08/27/15 0416  BP:  127/69 129/68 101/56  Pulse:  93 82 81  Temp: 98.7 F (37.1 C)  98.9 F (37.2 C) 98.2 F (36.8 C)  TempSrc: Oral  Oral Oral  Resp:  19 18 18   Height:   5' 6"  (1.676 m)   Weight:   194 lb 14.4 oz (88.406 kg)   SpO2:  96% 97% 96%    Intake/Output Summary (Last 24 hours) at 08/27/15 0847 Last data filed at 08/27/15 0741  Gross per 24 hour  Intake 1263.75 ml  Output   1100 ml  Net 163.75 ml   Filed Weights   08/26/15 2323  Weight: 194 lb 14.4 oz (88.406 kg)    Exam:  Eyes: PERRL, lids and conjunctivae normal scleral icterus improving ENMT: Mucous membranes are moist. Posterior pharynx clear of any exudate or lesions. Normal dentition.  Neck: normal, supple, no masses, no thyromegaly Respiratory: clear to auscultation bilaterally, no wheezing, no crackles. Normal respiratory effort. No  accessory muscle use.  Cardiovascular: Regular rate and rhythm, no murmurs / rubs / gallops. No extremity edema. 2+ pedal pulses. No carotid bruits.  Abdomen: no tenderness, no masses palpated. No hepatosplenomegaly. Bowel sounds positive.  Musculoskeletal: no clubbing / cyanosis. No joint deformity upper and lower extremities. Good ROM, no contractures. Normal muscle tone.  Skin: Robotic total abdominal hysterectomy wounds appear to be healing well Neurologic: CN 2-12 grossly intact. Sensation intact, DTR normal. Strength 5/5 in all 4.  Psychiatric: Normal judgment and insight. Alert and oriented x 3. Normal mood.  Data Reviewed: Basic Metabolic Panel:  Recent Labs Lab 08/26/15 1628 08/27/15 0515  NA 135 135  K 2.7* 3.2*  CL 95* 99*  CO2 31 26  GLUCOSE 145* 157*  BUN 10 7  CREATININE 0.72 0.79  CALCIUM 9.3 8.8*   Liver Function Tests:  Recent Labs Lab 08/26/15 1628 08/27/15 0515  AST 257* 190*  ALT 376* 321*  ALKPHOS 99 83  BILITOT 3.5* 3.8*  PROT 7.6 5.9*  ALBUMIN 3.6 2.8*    Recent Labs Lab 08/27/15 0103  LIPASE 24    Recent Labs Lab 08/27/15 0009  AMMONIA 27   CBC:  Recent Labs Lab 08/26/15 1628 08/27/15 0515  WBC 7.5 8.4  HGB 14.3 12.5  HCT 41.2 38.2  MCV 82.2 84.3  PLT 303 238   Cardiac Enzymes: No results for input(s): CKTOTAL, CKMB, CKMBINDEX, TROPONINI in the last 168 hours. BNP (last 3 results) No results for input(s): PROBNP in the last 8760 hours. CBG: No results for input(s): GLUCAP in the last 168 hours.  Recent Results (from the past 240 hour(s))  Aerobic Culture (superficial specimen)     Status: None (Preliminary result)   Collection Time: 08/26/15  4:24 PM  Result Value Ref Range Status   Specimen Description VAGINA SURGICAL SITE  Final   Special Requests NONE  Final   Gram Stain   Final    NO WBC SEEN ABUNDANT GRAM NEGATIVE RODS FEW GRAM POSITIVE RODS FEW GRAM POSITIVE COCCI IN PAIRS CONFIRMED BY LESLIE B     Culture PENDING  Incomplete   Report Status PENDING  Incomplete     Studies: No results found.   Scheduled Meds: . cefTRIAXone (ROCEPHIN)  IV  1 g Intravenous QHS  . DULoxetine  60 mg Oral Daily  . enoxaparin (LOVENOX) injection  40 mg Subcutaneous Q24H  . metoprolol tartrate  25 mg Oral BID  . multivitamin with minerals  1 tablet Oral Daily  . potassium chloride SA  20 mEq Oral BID  . potassium chloride  40 mEq Oral Once   Continuous Infusions: . 0.9 % NaCl with KCl 20 mEq / L     Active Problems:   Hypokalemia   Fever   Drug reaction   Transaminitis   Infection of urinary tract   Nausea and vomiting  Time spent:   Irwin Brakeman, MD, FAAFP [facility] Pager [phone]  If 7PM-7AM, please contact night-coverage [email or web address] Password [email or web address] 08/27/2015, 8:47 AM    LOS: 1 day

## 2015-08-28 DIAGNOSIS — E876 Hypokalemia: Secondary | ICD-10-CM | POA: Diagnosis not present

## 2015-08-28 DIAGNOSIS — N3000 Acute cystitis without hematuria: Secondary | ICD-10-CM | POA: Diagnosis not present

## 2015-08-28 DIAGNOSIS — R502 Drug induced fever: Secondary | ICD-10-CM | POA: Diagnosis not present

## 2015-08-28 DIAGNOSIS — T887XXD Unspecified adverse effect of drug or medicament, subsequent encounter: Secondary | ICD-10-CM | POA: Diagnosis not present

## 2015-08-28 LAB — COMPREHENSIVE METABOLIC PANEL
ALT: 251 U/L — ABNORMAL HIGH (ref 14–54)
AST: 120 U/L — ABNORMAL HIGH (ref 15–41)
Albumin: 2.5 g/dL — ABNORMAL LOW (ref 3.5–5.0)
Alkaline Phosphatase: 74 U/L (ref 38–126)
Anion gap: 3 — ABNORMAL LOW (ref 5–15)
BUN: <5 mg/dL — ABNORMAL LOW (ref 6–20)
CO2: 28 mmol/L (ref 22–32)
Calcium: 8.6 mg/dL — ABNORMAL LOW (ref 8.9–10.3)
Chloride: 110 mmol/L (ref 101–111)
Creatinine, Ser: 0.61 mg/dL (ref 0.44–1.00)
GFR calc Af Amer: >60 mL/min (ref >60)
GFR calc non Af Amer: >60 mL/min (ref >60)
Glucose, Bld: 131 mg/dL — ABNORMAL HIGH (ref 65–99)
Potassium: 3.8 mmol/L (ref 3.5–5.1)
Sodium: 141 mmol/L (ref 135–145)
Total Bilirubin: 2.1 mg/dL — ABNORMAL HIGH (ref 0.3–1.2)
Total Protein: 5.5 g/dL — ABNORMAL LOW (ref 6.5–8.1)

## 2015-08-28 LAB — CBC WITH DIFFERENTIAL/PLATELET
Basophils Absolute: 0 10*3/uL (ref 0.0–0.1)
Basophils Relative: 1 %
Eosinophils Absolute: 0.5 10*3/uL (ref 0.0–0.7)
Eosinophils Relative: 14 %
HCT: 34.9 % — ABNORMAL LOW (ref 36.0–46.0)
Hemoglobin: 11.4 g/dL — ABNORMAL LOW (ref 12.0–15.0)
Lymphocytes Relative: 35 %
Lymphs Abs: 1.3 10*3/uL (ref 0.7–4.0)
MCH: 27.9 pg (ref 26.0–34.0)
MCHC: 32.7 g/dL (ref 30.0–36.0)
MCV: 85.5 fL (ref 78.0–100.0)
Monocytes Absolute: 0.5 10*3/uL (ref 0.1–1.0)
Monocytes Relative: 13 %
Neutro Abs: 1.4 10*3/uL — ABNORMAL LOW (ref 1.7–7.7)
Neutrophils Relative %: 37 %
Platelets: 239 10*3/uL (ref 150–400)
RBC: 4.08 MIL/uL (ref 3.87–5.11)
RDW: 14.3 % (ref 11.5–15.5)
WBC: 3.7 10*3/uL — ABNORMAL LOW (ref 4.0–10.5)

## 2015-08-28 LAB — HEMOGLOBIN A1C
Hgb A1c MFr Bld: 5.3 % (ref 4.8–5.6)
Mean Plasma Glucose: 105 mg/dL

## 2015-08-28 LAB — URINE CULTURE: Culture: 1000 — AB

## 2015-08-28 NOTE — Discharge Instructions (Signed)
Please follow up with Dr Marcello Moores later this week to have your liver enzymes retested.   Please follow up with your gynecologist.

## 2015-08-28 NOTE — Progress Notes (Signed)
[name] [name] to be D/C'd  per MD order. Discussed with the patient and all questions fully answered.  VSS, Skin clean, dry and intact without evidence of skin break down, no evidence of skin tears noted.  IV catheter discontinued intact. Site without signs and symptoms of complications. Dressing and pressure applied.  An After Visit Summary was printed and given to the patient. Patient received prescription.  D/c education completed with patient/family including follow up instructions, medication list, d/c activities limitations if indicated, with other d/c instructions as indicated by MD - patient able to verbalize understanding, all questions fully answered.   Patient instructed to return to ED, call 911, or call MD for any changes in condition.   Patient to be escorted via [place], and D/C home via private auto.

## 2015-08-28 NOTE — Discharge Summary (Signed)
Physician Discharge Summary  [name] [name]:[id number] DOB: 1960-10-13 DOA: 08/26/2015  PCP: Scarlette Calico, MD  Admit date: 08/26/2015 Discharge date: 08/28/2015  Admitted From: Home Disposition:  Home  Recommendations for Outpatient Follow-up:  1. Follow up with PCP later this week to have liver enzymes retested.  2. Please discuss blood pressure medications with PCP.   Discharge Condition: Stable  CODE STATUS: Full  Diet recommendation: Heart Healthy   Brief/Interim Summary: Hospital course: 55yo Caucasian female 2 weeks s/p Robotic total hysterectomy, bilateral salpingo-ophorectomy presents to MAU complaining of a three-day history of fever. The patient was seen in the office for her postoperative evaluation by Dr. Bobbye Charleston. During that evaluation it was felt that she had a UTI and she was started on Macrobid. Thursday morning she awakened with a fever. She has been having fevers and feeling poorly since then. At home her highest temp was 101.3. She endorses muscle aches and pains, denies vomiting or diarrhea but has felt nauseated. Denies abnormal vaginal discharge. Pt has been taking Aspirin at home for fevers  In April 2017 she was treated for BV and a UTI with Flagyl & Macrobid. Soon thereafter she was seen by her PCP for abdominal pain. She was noted to have elevated LFTs 163/234. A CT abdomen and pelvis was performed and unremarkable for liver/pancreas pathology. Hepatitis serologies were negative. Repeat LFTs 3 weeks later were normal.   Course: [facility] and [facility] after being found to have elevated liver enzymes and fever. Urinalysis was still seen to be weakly positive for signs of bacteria and patient had been taking macrobid.   Fever / suspected drug reaction/ Transaminitis: Given recent similar history this is suspected Macrobid is the cause of the patient for her symptoms. Patient with scleral icterus and elevated liver  enzymes. Suspect that this could be the possible cause of symptoms. Recent hepatitis panel negative on 5/3. Previously, had similar elevated transaminases noted after being on Macrobid on 4/17 ALT up to 234 and AST up to 163. Then returned normal.  For this hospitalization, liver enzymes have been trending down.  Pt to see PCP this week to have them re-tested.    - Admitted to a MedSurg bed - IV fluids given - Elevated ESR CRP noted - Macrobid Placed on allergy list   Urinary tract infection: Present on arrival. Patient had a [facility] which revealed a few bacteria, small leukocytes, ketones, and 6-30 WBCs.  - Check urine culture  - Pt was treated with IV ceftriaxone.   S/p robotic total abdominal hysterectomy - Continue Percocet prn for pain -  Followup with Gyn outpatient  Nausea without vomiting - resolved now - Zofran /Compazine prn   Hypokalemia - Give 40 mEq of potassium chloride 1 dose now - Continue to follow and replace as needed - checking magnesium - resolved now.    Chronic pain - Continue duloxetine   Essential hypertension - Held enalapril-hydrochlorothiazide. Pt has soft low blood pressures, did not restart.  Re-evaluate with PCP.  - Continue metoprolol  Discharge Diagnoses:  Active Problems:   Hypokalemia   Fever   Drug reaction   Transaminitis   Infection of urinary tract   Nausea and vomiting  Discharge Instructions  Discharge Instructions    Call MD for:  extreme fatigue    Complete by:  As directed      Call MD for:  persistant nausea and vomiting    Complete  by:  As directed      Call MD for:  severe uncontrolled pain    Complete by:  As directed      Diet - low sodium heart healthy    Complete by:  As directed      Increase activity slowly    Complete by:  As directed             Medication List    STOP taking these medications        AZO TABS PO     cefUROXime 250 MG tablet  Commonly known as:  CEFTIN      enalapril-hydrochlorothiazide 10-25 MG tablet  Commonly known as:  VASERETIC     lubiprostone 24 MCG capsule  Commonly known as:  AMITIZA     nitrofurantoin (macrocrystal-monohydrate) 100 MG capsule  Commonly known as:  MACROBID     potassium chloride SA 20 MEQ tablet  Commonly known as:  K-DUR,KLOR-CON      TAKE these medications        DULoxetine 60 MG capsule  Commonly known as:  CYMBALTA  Take 1 capsule (60 mg total) by mouth daily.     metoprolol tartrate 25 MG tablet  Commonly known as:  LOPRESSOR  Take 1 tablet (25 mg total) by mouth 2 (two) times daily.     multivitamin with minerals Tabs tablet  Take 1 tablet by mouth daily.     oxyCODONE-acetaminophen 5-325 MG tablet  Commonly known as:  PERCOCET/ROXICET  Take 1-2 tablets by mouth every 4 (four) hours as needed for severe pain (moderate to severe pain (when tolerating fluids)).      ASK your doctor about these medications        estradiol 0.5 MG tablet  Commonly known as:  ESTRACE  Take 0.5 mg by mouth daily.           Follow-up Information    Follow up with Scarlette Calico, MD. Schedule an appointment as soon as possible for a visit in 4 days.   Specialty:  Internal Medicine   Why:  Get repeat labs drawn for liver   Contact information:   [address] [place] [address] [phone]       Follow up with HORVATH,MICHELLE A, MD. Schedule an appointment as soon as possible for a visit in 1 week.   Specialty:  Obstetrics and Gynecology   Why:  hospital followup   Contact information:   [place] [address] [phone]      Allergies  Allergen Reactions  . Codeine Nausea And Vomiting    REACTION: causes nausea and vomiting  . Macrobid [Nitrofurantoin] Other (See Comments)    Gets elevated liver enzymes, low potassium, headache, nausea and generalized body aches shortly after taking meds.  This is the second time this had occurred.  .  Oxycodone-Acetaminophen Itching   Subjective: Pt has been active and ambulating the halls.  She really wants to go home today.   Discharge Exam: Filed Vitals:   08/27/15 2106 08/28/15 0525  BP: 115/65 119/71  Pulse: 78 70  Temp: 98.6 F (37 C) 97.8 F (36.6 C)  Resp: 17 18   Filed Vitals:   08/27/15 1000 08/27/15 1307 08/27/15 2106 08/28/15 0525  BP: 95/49 114/60 115/65 119/71  Pulse:  87 78 70  Temp:   98.6 F (37 C) 97.8 F (36.6 C)  TempSrc:   Oral Oral  Resp:  18 17 18  Height:      Weight:      SpO2:  97% 99% 96%    Eyes: PERRL, lids and conjunctivae normal scleral icterus nearly resolved.  ENMT: Mucous membranes are moist. Posterior pharynx clear of any exudate or lesions. Normal dentition.  Neck: normal, supple, no masses, no thyromegaly Respiratory: clear to auscultation bilaterally, no wheezing, no crackles. Normal respiratory effort. No accessory muscle use.  Cardiovascular: Regular rate and rhythm, no murmurs / rubs / gallops. No extremity edema. 2+ pedal pulses. No carotid bruits.  Abdomen: no tenderness, no masses palpated. No hepatosplenomegaly. Bowel sounds positive.  Musculoskeletal: no clubbing / cyanosis. No joint deformity upper and lower extremities. Good ROM, no contractures. Normal muscle tone.  Skin: Robotic total abdominal hysterectomy wounds appear to be healing well Neurologic: CN 2-12 grossly intact. Sensation intact, DTR normal. Strength 5/5 in all 4.  Psychiatric: Normal judgment and insight. Alert and oriented x 3. Normal mood.   The results of significant diagnostics from this hospitalization (including imaging, microbiology, ancillary and laboratory) are listed below for reference.    Microbiology: Recent Results (from the past 240 hour(s))  Culture, OB Urine     Status: Abnormal   Collection Time: 08/26/15  3:50 PM  Result Value Ref Range Status   Specimen Description URINE, RANDOM  Final   Special Requests NONE  Final   Culture  (A)  Final    MULTIPLE SPECIES PRESENT, SUGGEST RECOLLECTION NO GROUP B STREP (S.AGALACTIAE) ISOLATED Performed at [facility]    Report Status 08/27/2015 FINAL  Final  Aerobic Culture (superficial specimen)     Status: None (Preliminary result)   Collection Time: 08/26/15  4:24 PM  Result Value Ref Range Status   Specimen Description VAGINA SURGICAL SITE  Final   Special Requests NONE  Final   Gram Stain   Final    NO WBC SEEN ABUNDANT GRAM NEGATIVE RODS FEW GRAM POSITIVE RODS FEW GRAM POSITIVE COCCI IN PAIRS CONFIRMED BY LESLIE B    Culture   Final    CULTURE REINCUBATED FOR BETTER GROWTH Performed at [facility]    Report Status PENDING  Incomplete     Labs: BNP (last 3 results) No results for input(s): BNP in the last 8760 hours. Basic Metabolic Panel:  Recent Labs Lab 08/26/15 1628 08/27/15 0515 08/27/15 0908 08/28/15 0402  NA 135 135  --  141  K 2.7* 3.2*  --  3.8  CL 95* 99*  --  110  CO2 31 26  --  28  GLUCOSE 145* 157*  --  131*  BUN 10 7  --  <5*  CREATININE 0.72 0.79  --  0.61  CALCIUM 9.3 8.8*  --  8.6*  MG  --   --  1.7  --    Liver Function Tests:  Recent Labs Lab 08/26/15 1628 08/27/15 0515 08/28/15 0402  AST 257* 190* 120*  ALT 376* 321* 251*  ALKPHOS 99 83 74  BILITOT 3.5* 3.8* 2.1*  PROT 7.6 5.9* 5.5*  ALBUMIN 3.6 2.8* 2.5*    Recent Labs Lab 08/27/15 0103  LIPASE 24    Recent Labs Lab 08/27/15 0009  AMMONIA 27   CBC:  Recent Labs Lab 08/26/15 1628 08/27/15 0515 08/28/15 0402  WBC 7.5 8.4 3.7*  NEUTROABS  --   --  1.4*  HGB 14.3 12.5 11.4*  HCT 41.2 38.2 34.9*  MCV 82.2 84.3 85.5  PLT 303 238 239   Cardiac Enzymes: No results  for input(s): CKTOTAL, CKMB, CKMBINDEX, TROPONINI in the last 168 hours. BNP: Invalid input(s): POCBNP CBG: No results for input(s): GLUCAP in the last 168 hours. D-Dimer No results for input(s): DDIMER in the last 72 hours. Hgb A1c No results for input(s): HGBA1C in  the last 72 hours. Lipid Profile No results for input(s): CHOL, HDL, LDLCALC, TRIG, CHOLHDL, LDLDIRECT in the last 72 hours. Thyroid function studies No results for input(s): TSH, T4TOTAL, T3FREE, THYROIDAB in the last 72 hours.  Invalid input(s): FREET3 Anemia work up No results for input(s): VITAMINB12, FOLATE, FERRITIN, TIBC, IRON, RETICCTPCT in the last 72 hours. Urinalysis    Component Value Date/Time   COLORURINE YELLOW 08/26/2015 1531   APPEARANCEUR CLEAR 08/26/2015 1531   LABSPEC 1.010 08/26/2015 1531   PHURINE 7.0 08/26/2015 1531   GLUCOSEU NEGATIVE 08/26/2015 1531   HGBUR NEGATIVE 08/26/2015 1531   BILIRUBINUR MODERATE* 08/26/2015 1531   BILIRUBINUR neg 07/03/2015 1116   KETONESUR 15* 08/26/2015 1531   PROTEINUR NEGATIVE 08/26/2015 1531   PROTEINUR 15 07/03/2015 1116   UROBILINOGEN 1.0 07/03/2015 1116   NITRITE NEGATIVE 08/26/2015 1531   NITRITE neg 07/03/2015 1116   LEUKOCYTESUR SMALL* 08/26/2015 1531   Sepsis Labs Invalid input(s): PROCALCITONIN,  WBC,  LACTICIDVEN Microbiology Recent Results (from the past 240 hour(s))  Culture, OB Urine     Status: Abnormal   Collection Time: 08/26/15  3:50 PM  Result Value Ref Range Status   Specimen Description URINE, RANDOM  Final   Special Requests NONE  Final   Culture (A)  Final    MULTIPLE SPECIES PRESENT, SUGGEST RECOLLECTION NO GROUP B STREP (S.AGALACTIAE) ISOLATED Performed at [facility]    Report Status 08/27/2015 FINAL  Final  Aerobic Culture (superficial specimen)     Status: None (Preliminary result)   Collection Time: 08/26/15  4:24 PM  Result Value Ref Range Status   Specimen Description VAGINA SURGICAL SITE  Final   Special Requests NONE  Final   Gram Stain   Final    NO WBC SEEN ABUNDANT GRAM NEGATIVE RODS FEW GRAM POSITIVE RODS FEW GRAM POSITIVE COCCI IN PAIRS CONFIRMED BY LESLIE B    Culture   Final    CULTURE REINCUBATED FOR BETTER GROWTH Performed at [facility]    Report  Status PENDING  Incomplete   Time coordinating discharge: Over 32 minutes  SIGNED:  Irwin Brakeman, MD  [facility] 08/28/2015, 8:53 AM Pager   If 7PM-7AM, please contact night-coverage [email or web address] Password [email or web address]

## 2015-08-29 LAB — AEROBIC CULTURE W GRAM STAIN (SUPERFICIAL SPECIMEN): Culture: NORMAL

## 2015-08-29 LAB — AEROBIC CULTURE  (SUPERFICIAL SPECIMEN): Gram Stain: NONE SEEN

## 2015-08-31 ENCOUNTER — Other Ambulatory Visit: Payer: Self-pay | Admitting: Internal Medicine

## 2015-08-31 ENCOUNTER — Ambulatory Visit (INDEPENDENT_AMBULATORY_CARE_PROVIDER_SITE_OTHER): Payer: Commercial Managed Care - PPO

## 2015-08-31 ENCOUNTER — Other Ambulatory Visit (INDEPENDENT_AMBULATORY_CARE_PROVIDER_SITE_OTHER): Payer: Commercial Managed Care - PPO

## 2015-08-31 DIAGNOSIS — R7989 Other specified abnormal findings of blood chemistry: Secondary | ICD-10-CM

## 2015-08-31 DIAGNOSIS — Z23 Encounter for immunization: Secondary | ICD-10-CM

## 2015-08-31 DIAGNOSIS — R945 Abnormal results of liver function studies: Principal | ICD-10-CM

## 2015-08-31 LAB — HEPATIC FUNCTION PANEL
ALT: 137 U/L — ABNORMAL HIGH (ref 0–35)
AST: 39 U/L — ABNORMAL HIGH (ref 0–37)
Albumin: 3.9 g/dL (ref 3.5–5.2)
Alkaline Phosphatase: 90 U/L (ref 39–117)
Bilirubin, Direct: 0.3 mg/dL (ref 0.0–0.3)
Total Bilirubin: 0.8 mg/dL (ref 0.2–1.2)
Total Protein: 7.5 g/dL (ref 6.0–8.3)

## 2015-08-31 LAB — GAMMA GT: GGT: 202 U/L — ABNORMAL HIGH (ref 7–51)

## 2015-09-01 ENCOUNTER — Encounter: Payer: Self-pay | Admitting: Internal Medicine

## 2015-10-13 ENCOUNTER — Other Ambulatory Visit: Payer: Self-pay | Admitting: Obstetrics and Gynecology

## 2015-10-30 ENCOUNTER — Ambulatory Visit: Payer: Commercial Managed Care - PPO

## 2015-11-21 ENCOUNTER — Ambulatory Visit (INDEPENDENT_AMBULATORY_CARE_PROVIDER_SITE_OTHER): Payer: Commercial Managed Care - PPO | Admitting: General Practice

## 2015-11-21 DIAGNOSIS — Z23 Encounter for immunization: Secondary | ICD-10-CM

## 2015-12-06 LAB — HM PAP SMEAR

## 2015-12-26 ENCOUNTER — Ambulatory Visit: Payer: Commercial Managed Care - PPO | Admitting: Internal Medicine

## 2016-01-02 ENCOUNTER — Other Ambulatory Visit: Payer: Self-pay | Admitting: Internal Medicine

## 2016-01-05 ENCOUNTER — Other Ambulatory Visit: Payer: Self-pay | Admitting: Internal Medicine

## 2016-01-17 ENCOUNTER — Other Ambulatory Visit: Payer: Self-pay | Admitting: Internal Medicine

## 2016-01-17 DIAGNOSIS — I10 Essential (primary) hypertension: Secondary | ICD-10-CM

## 2016-01-25 ENCOUNTER — Other Ambulatory Visit: Payer: Self-pay | Admitting: *Deleted

## 2016-01-25 DIAGNOSIS — I10 Essential (primary) hypertension: Secondary | ICD-10-CM

## 2016-01-25 MED ORDER — METOPROLOL TARTRATE 25 MG PO TABS: 25.0000 mg | ORAL_TABLET | Freq: Two times a day (BID) | ORAL | 0 refills | Status: DC

## 2016-01-29 ENCOUNTER — Ambulatory Visit (INDEPENDENT_AMBULATORY_CARE_PROVIDER_SITE_OTHER): Payer: Commercial Managed Care - PPO

## 2016-01-29 DIAGNOSIS — Z23 Encounter for immunization: Secondary | ICD-10-CM | POA: Diagnosis not present

## 2016-01-30 ENCOUNTER — Emergency Department (HOSPITAL_BASED_OUTPATIENT_CLINIC_OR_DEPARTMENT_OTHER)
Admission: EM | Admit: 2016-01-30 | Discharge: 2016-01-30 | Disposition: A | Discharge status: Home / Self Care | Payer: Commercial Managed Care - PPO | Attending: Emergency Medicine | Admitting: Emergency Medicine

## 2016-01-30 ENCOUNTER — Encounter (HOSPITAL_BASED_OUTPATIENT_CLINIC_OR_DEPARTMENT_OTHER): Payer: Self-pay | Admitting: Emergency Medicine

## 2016-01-30 DIAGNOSIS — R079 Chest pain, unspecified: Secondary | ICD-10-CM | POA: Diagnosis present

## 2016-01-30 DIAGNOSIS — I10 Essential (primary) hypertension: Secondary | ICD-10-CM | POA: Insufficient documentation

## 2016-01-30 DIAGNOSIS — Z79899 Other long term (current) drug therapy: Secondary | ICD-10-CM | POA: Insufficient documentation

## 2016-01-30 LAB — BASIC METABOLIC PANEL
Anion gap: 7 (ref 5–15)
BUN: 12 mg/dL (ref 6–20)
CO2: 28 mmol/L (ref 22–32)
Calcium: 9.6 mg/dL (ref 8.9–10.3)
Chloride: 101 mmol/L (ref 101–111)
Creatinine, Ser: 0.65 mg/dL (ref 0.44–1.00)
GFR calc Af Amer: >60 mL/min (ref >60)
GFR calc non Af Amer: >60 mL/min (ref >60)
Glucose, Bld: 105 mg/dL — ABNORMAL HIGH (ref 65–99)
Potassium: 3.4 mmol/L — ABNORMAL LOW (ref 3.5–5.1)
Sodium: 136 mmol/L (ref 135–145)

## 2016-01-30 LAB — CBC
HCT: 43.7 % (ref 36.0–46.0)
Hemoglobin: 14.9 g/dL (ref 12.0–15.0)
MCH: 28.7 pg (ref 26.0–34.0)
MCHC: 34.1 g/dL (ref 30.0–36.0)
MCV: 84 fL (ref 78.0–100.0)
Platelets: 311 10*3/uL (ref 150–400)
RBC: 5.2 MIL/uL — ABNORMAL HIGH (ref 3.87–5.11)
RDW: 13.9 % (ref 11.5–15.5)
WBC: 6.9 10*3/uL (ref 4.0–10.5)

## 2016-01-30 LAB — TROPONIN I: Troponin I: <0.03 ng/mL (ref <0.03)

## 2016-01-30 NOTE — ED Triage Notes (Signed)
Patient states that she had Heart pressure strating at 9 am  - patient has a history of Bigemini

## 2016-01-30 NOTE — ED Provider Notes (Signed)
[facility] Provider Note   CSN: [id number] Arrival date & time: 01/30/16  1045     History   Chief Complaint Chief Complaint  Patient presents with  . Chest Pain    HPI LATIFHA RASHID is a 55 y.o. female.  Patient is a 55 year old female with history of hypertension and PVCs. She presents for evaluation of chest discomfort. This began while she was at work this morning at approximately 9:00. While sitting at her desk, she describes feeling tightness in the left front of her chest with no radiation, nausea, diaphoresis, or shortness of breath. She denies any fevers or chills. She denies any recent cough.  She reports having a normal stress test approximately 2 years ago, however denies any other cardiac history. She has no cardiac risk factors with the exception of family history and hypertension.   The history is provided by the patient.  Chest Pain   This is a new problem. Episode onset: 2 hours ago. The problem occurs constantly. The problem has been gradually improving. The pain is present in the substernal region. The pain is mild. The quality of the pain is described as pressure-like. The pain does not radiate. Pertinent negatives include no diaphoresis, no fever, no nausea and no shortness of breath.    Past Medical History:  Diagnosis Date  . Anxiety   . Depression   . Dysrhythmia    bigeminy  . Elevated liver enzymes 2017   undiagnosed and levels recently were more normal per pt  . Hypertension   . IBS (irritable bowel syndrome)   . Migraine, unspecified, without mention of intractable migraine without mention of status migrainosus   . PONV (postoperative nausea and vomiting)   . Tachycardia, unspecified    on BBloc    Patient Active Problem List   Diagnosis Date Noted  . Drug reaction 08/27/2015  . Transaminitis 08/27/2015  . Infection of urinary tract 08/27/2015  . Nausea and vomiting 08/27/2015  . Fever 08/26/2015  . Postoperative state  08/09/2015  . Elevated LFTs 07/03/2015  . Hypokalemia 07/03/2015  . Constipation 07/03/2015  . Overweight(278.02)   . Routine health maintenance 12/26/2010  . URINARY INCONTINENCE, STRESS, FEMALE, MILD 08/03/2009  . BAKER'S CYST, LEFT KNEE 08/27/2007  . DEPRESSION/ANXIETY 04/28/2007  . MIGRAINE HEADACHE 04/28/2007  . Essential hypertension 04/28/2007  . IRRITABLE BOWEL SYNDROME 04/28/2007  . SPONDYLOSIS, CERVICAL, WITH RADICULOPATHY 04/28/2007  . TACHYCARDIA 04/28/2007    Past Surgical History:  Procedure Laterality Date  . Bilateral myofascial release for recurrent plantar faciitis    . BLADDER SUSPENSION N/A 08/09/2015   Procedure: TRANSVAGINAL TAPE (TVT) PROCEDURE;  Surgeon: Bobbye Charleston, MD;  Location: [facility];  Service: Gynecology;  Laterality: N/A;  . CARPAL TUNNEL RELEASE    . Colonoscopy with polypectomy  2016   Repeat 2021  . CYSTOCELE REPAIR  08/09/2015   Procedure: ANTERIOR REPAIR (CYSTOCELE);  Surgeon: Bobbye Charleston, MD;  Location: [facility];  Service: Gynecology;;  . Consuela Mimes N/A 08/09/2015   Procedure: CYSTOSCOPY;  Surgeon: Bobbye Charleston, MD;  Location: [facility];  Service: Gynecology;  Laterality: N/A;  . ROBOTIC ASSISTED TOTAL HYSTERECTOMY WITH BILATERAL SALPINGO OOPHERECTOMY Bilateral 08/09/2015   Procedure: ROBOTIC ASSISTED TOTAL HYSTERECTOMY WITH BILATERAL SALPINGO OOPHORECTOMY;  Surgeon: Bobbye Charleston, MD;  Location: [facility];  Service: Gynecology;  Laterality: Bilateral;  . TUBAL LIGATION      OB History    Gravida Para Term Preterm AB Living   0 0 0 0 0 0  SAB TAB Ectopic Multiple Live Births   0 0 0 0         Home Medications    Prior to Admission medications   Medication Sig Start Date End Date Taking? Authorizing Provider  DULoxetine (CYMBALTA) 60 MG capsule Take 1 capsule (60 mg total) by mouth daily. 01/18/15   Rowe Clack, MD  enalapril-hydrochlorothiazide (VASERETIC) 10-25 MG tablet TAKE 1 TABLET BY MOUTH EVERY DAY 01/05/16   Biagio Borg, MD  metoprolol tartrate (LOPRESSOR) 25 MG tablet Take 1 tablet (25 mg total) by mouth 2 (two) times daily. Overdue for yearly physical w/labs must see MD for future refills 01/25/16   Janith Lima, MD  Multiple Vitamin (MULTIVITAMIN WITH MINERALS) TABS tablet Take 1 tablet by mouth daily.    Historical Provider, MD  oxyCODONE-acetaminophen (PERCOCET/ROXICET) 5-325 MG tablet Take 1-2 tablets by mouth every 4 (four) hours as needed for severe pain (moderate to severe pain (when tolerating fluids)). 08/10/15   Bobbye Charleston, MD    Family History Family History  Problem Relation Age of Onset  . Cancer Mother     breast cancer-left mastectomy  . Asthma Mother   . Alzheimer's disease Mother   . Transient ischemic attack Mother   . Hypertension Father   . Hyperlipidemia Father   . Diabetes Father   . Heart attack Father 41    CABG  . Heart failure Father   . Pancreatitis Father   . Alzheimer's disease Maternal Grandmother     Social History Social History  Substance Use Topics  . Smoking status: Never Smoker  . Smokeless tobacco: Never Used  . Alcohol use Yes     Comment:  Typically 1 wine or beer per month     Allergies   Codeine; Macrobid [nitrofurantoin]; and Oxycodone-acetaminophen   Review of Systems Review of Systems  Constitutional: Negative for diaphoresis and fever.  Respiratory: Negative for shortness of breath.   Cardiovascular: Positive for chest pain.  Gastrointestinal: Negative for nausea.  All other systems reviewed and are negative.    Physical Exam Updated Vital Signs BP 131/81 (BP Location: Right Arm)   Pulse 68   Temp 98.4 F (36.9 C) (Oral)   Resp 18   Ht 5\' 6"  (1.676 m)   Wt 198 lb (89.8 kg)   LMP  (LMP Unknown)   SpO2 100%   BMI 31.96 kg/m   Physical Exam  Constitutional: She is oriented to person, place, and time. She appears well-developed and well-nourished. No distress.  HENT:  Head: Normocephalic and atraumatic.  Neck:  Normal range of motion. Neck supple.  Cardiovascular: Normal rate and regular rhythm.  Exam reveals no gallop and no friction rub.   No murmur heard. Pulmonary/Chest: Effort normal and breath sounds normal. No respiratory distress. She has no wheezes.  Abdominal: Soft. Bowel sounds are normal. She exhibits no distension. There is no tenderness.  Musculoskeletal: Normal range of motion. She exhibits no edema.  There is no calf swelling or tenderness. Homans sign is absent bilaterally.  Neurological: She is alert and oriented to person, place, and time.  Skin: Skin is warm and dry. She is not diaphoretic.  Nursing note and vitals reviewed.    ED Treatments / Results  Labs (all labs ordered are listed, but only abnormal results are displayed) Labs Reviewed - No data to display  EKG  EKG Interpretation  Date/Time:  Tuesday January 30 2016 10:59:43 EST Ventricular Rate:  65 PR Interval:  QRS Duration: 88 QT Interval:  415 QTC Calculation: 432 R Axis:   37 Text Interpretation:  Sinus rhythm Normal ECG Unchanged from 08/02/2015 Confirmed by Kesa Birky  MD, Vishwa Dais ([id number]) on 01/30/2016 11:08:06 AM       Radiology No results found.  Procedures Procedures (including critical care time)  Medications Ordered in ED Medications - No data to display   Initial Impression / Assessment and Plan / ED Course  I have reviewed the triage vital signs and the nursing notes.  Pertinent labs & imaging results that were available during my care of the patient were reviewed by me and considered in my medical decision making (see chart for details).  Clinical Course     Patient presents with complaints of chest discomfort. It is atypical in nature and EKG is unchanged. Troponin is negative. She had a normal stress test 2 years ago and I highly doubt a cardiac etiology. She will be discharged, to follow-up as needed. She understands to return if her symptoms worsen or change.  Final Clinical  Impressions(s) / ED Diagnoses   Final diagnoses:  None    New Prescriptions New Prescriptions   No medications on file     Veryl Speak, MD 01/30/16 1244

## 2016-01-30 NOTE — Discharge Instructions (Signed)
Return to the emergency department if you develop worsening pain, difficulty breathing, or other new and concerning symptoms. 

## 2016-02-07 ENCOUNTER — Other Ambulatory Visit: Payer: Self-pay | Admitting: Internal Medicine

## 2016-02-21 ENCOUNTER — Other Ambulatory Visit: Payer: Self-pay | Admitting: Internal Medicine

## 2016-02-21 DIAGNOSIS — I10 Essential (primary) hypertension: Secondary | ICD-10-CM

## 2016-03-04 ENCOUNTER — Other Ambulatory Visit: Payer: Self-pay | Admitting: Internal Medicine

## 2016-03-04 ENCOUNTER — Telehealth: Payer: Self-pay | Admitting: Internal Medicine

## 2016-03-04 DIAGNOSIS — I10 Essential (primary) hypertension: Secondary | ICD-10-CM

## 2016-03-04 MED ORDER — ENALAPRIL-HYDROCHLOROTHIAZIDE 10-25 MG PO TABS: 1.0000 | ORAL_TABLET | Freq: Every day | ORAL | 1 refills | Status: DC

## 2016-03-04 NOTE — Telephone Encounter (Signed)
Pt informed rf was sent.

## 2016-03-04 NOTE — Telephone Encounter (Signed)
done

## 2016-03-04 NOTE — Telephone Encounter (Signed)
Patient has appt scheduled for 1/9.  Patient is requesting enalopril to be sent to [facility] at [facility] and [facility] to get her through until that appt.

## 2016-03-04 NOTE — Telephone Encounter (Signed)
Ok to fill 30 day?

## 2016-03-21 DIAGNOSIS — M5127 Other intervertebral disc displacement, lumbosacral region: Secondary | ICD-10-CM | POA: Diagnosis not present

## 2016-03-21 DIAGNOSIS — M5126 Other intervertebral disc displacement, lumbar region: Secondary | ICD-10-CM | POA: Diagnosis not present

## 2016-03-21 DIAGNOSIS — I10 Essential (primary) hypertension: Secondary | ICD-10-CM | POA: Diagnosis not present

## 2016-03-26 ENCOUNTER — Encounter: Payer: Self-pay | Admitting: Internal Medicine

## 2016-03-26 ENCOUNTER — Ambulatory Visit (INDEPENDENT_AMBULATORY_CARE_PROVIDER_SITE_OTHER): Payer: Commercial Managed Care - PPO | Admitting: Internal Medicine

## 2016-03-26 VITALS — BP 124/84 | HR 88 | Temp 98.4°F | Ht 66.0 in | Wt 194.0 lb

## 2016-03-26 DIAGNOSIS — E876 Hypokalemia: Secondary | ICD-10-CM

## 2016-03-26 DIAGNOSIS — I10 Essential (primary) hypertension: Secondary | ICD-10-CM

## 2016-03-26 DIAGNOSIS — Z0000 Encounter for general adult medical examination without abnormal findings: Secondary | ICD-10-CM

## 2016-03-26 MED ORDER — TELMISARTAN-HCTZ 40-12.5 MG PO TABS: 1.0000 | ORAL_TABLET | Freq: Every day | ORAL | 1 refills | Status: DC

## 2016-03-26 MED ORDER — METOPROLOL TARTRATE 25 MG PO TABS: 25.0000 mg | ORAL_TABLET | Freq: Two times a day (BID) | ORAL | 1 refills | Status: DC

## 2016-03-26 NOTE — Patient Instructions (Signed)

## 2016-03-26 NOTE — Progress Notes (Signed)
Pre visit review using our clinic review tool, if applicable. No additional management support is needed unless otherwise documented below in the visit note. 

## 2016-03-26 NOTE — Progress Notes (Signed)
Subjective:  Patient ID: [name] [name], female    DOB: May 03, 1960  Age: 56 y.o. MRN: [id number]  CC: Annual Exam and Hypertension   HPI [name] [name] presents for a CPX.  She tells me her blood pressure has been well controlled but she also complains of a chronic, mild, nonproductive cough. She denies hemoptysis, chest pain, headache, nausea, vomiting, weight loss, wheezing, or shortness of breath.  Outpatient Medications Prior to Visit  Medication Sig Dispense Refill  . DULoxetine (CYMBALTA) 60 MG capsule Take 1 capsule (60 mg total) by mouth daily. 90 capsule 0  . oxyCODONE-acetaminophen (PERCOCET/ROXICET) 5-325 MG tablet Take 1-2 tablets by mouth every 4 (four) hours as needed for severe pain (moderate to severe pain (when tolerating fluids)). 30 tablet 0  . enalapril-hydrochlorothiazide (VASERETIC) 10-25 MG tablet Take 1 tablet by mouth daily. 30 tablet 1  . metoprolol tartrate (LOPRESSOR) 25 MG tablet TAKE 1 TABLET BY MOUTH TWICE DAILY 60 tablet 4  . Multiple Vitamin (MULTIVITAMIN WITH MINERALS) TABS tablet Take 1 tablet by mouth daily.     No facility-administered medications prior to visit.     ROS Review of Systems  Constitutional: Negative.  Negative for appetite change, diaphoresis, fatigue and unexpected weight change.  HENT: Negative.  Negative for congestion, facial swelling, rhinorrhea, sinus pressure, sore throat and trouble swallowing.   Eyes: Negative for visual disturbance.  Respiratory: Positive for cough. Negative for choking, chest tightness, shortness of breath, wheezing and stridor.   Cardiovascular: Negative for chest pain, palpitations and leg swelling.  Gastrointestinal: Negative.  Negative for abdominal pain, constipation, diarrhea, nausea and vomiting.  Endocrine: Negative.   Genitourinary: Negative.  Negative for difficulty urinating.  Musculoskeletal: Negative.  Negative for back pain and neck pain.  Skin: Negative.  Negative for color change and  rash.  Allergic/Immunologic: Negative.   Neurological: Negative.  Negative for dizziness, weakness, light-headedness, numbness and headaches.  Hematological: Negative.   Psychiatric/Behavioral: Negative.     Objective:  BP 124/84 (BP Location: Left Arm, Patient Position: Sitting, Cuff Size: Large)   Pulse 88   Temp 98.4 F (36.9 C) (Oral)   Ht 5\' 6"  (1.676 m)   Wt 194 lb (88 kg)   LMP  (LMP Unknown)   SpO2 95%   BMI 31.31 kg/m   BP Readings from Last 3 Encounters:  03/26/16 124/84  01/30/16 121/71  08/28/15 119/71    Wt Readings from Last 3 Encounters:  03/26/16 194 lb (88 kg)  01/30/16 198 lb (89.8 kg)  08/26/15 194 lb 14.4 oz (88.4 kg)    Physical Exam  Constitutional: She is oriented to person, place, and time. No distress.  HENT:  Mouth/Throat: Oropharynx is clear and moist. No oropharyngeal exudate.  Eyes: Conjunctivae are normal. Right eye exhibits no discharge. Left eye exhibits no discharge. No scleral icterus.  Neck: Normal range of motion. Neck supple. No JVD present. No tracheal deviation present. No thyromegaly present.  Cardiovascular: Normal rate, regular rhythm and normal heart sounds.  Exam reveals no gallop and no friction rub.   No murmur heard. Pulmonary/Chest: Effort normal and breath sounds normal. No stridor. No respiratory distress. She has no wheezes. She has no rales. She exhibits no tenderness.  Abdominal: Soft. Bowel sounds are normal. She exhibits no distension and no mass. There is no tenderness. There is no rebound and no guarding.  Musculoskeletal: Normal range of motion. She exhibits no edema, tenderness or deformity.  Lymphadenopathy:    She has no cervical  adenopathy.  Neurological: She is oriented to person, place, and time.  Skin: Skin is warm and dry. No rash noted. She is not diaphoretic. No erythema. No pallor.  Psychiatric: She has a normal mood and affect. Her behavior is normal. Judgment and thought content normal.  Vitals  reviewed.   Lab Results  Component Value Date   WBC 6.9 01/30/2016   HGB 14.9 01/30/2016   HCT 43.7 01/30/2016   PLT 311 01/30/2016   GLUCOSE 105 (H) 01/30/2016   CHOL 191 01/20/2015   TRIG 120.0 01/20/2015   HDL 37.90 (L) 01/20/2015   LDLCALC 129 (H) 01/20/2015   ALT 137 (H) 08/31/2015   AST 39 (H) 08/31/2015   NA 136 01/30/2016   K 3.4 (L) 01/30/2016   CL 101 01/30/2016   CREATININE 0.65 01/30/2016   BUN 12 01/30/2016   CO2 28 01/30/2016   TSH 1.77 01/20/2015   HGBA1C 5.3 08/27/2015    No results found.  Assessment & Plan:   [name] [name] was seen today for annual exam and hypertension.  Diagnoses and all orders for this visit:  Routine health maintenance- exam completed, labs ordered, vaccines reviewed, her Pap and mammogram and colonoscopy are up-to-date, patient education material was given. -     Lipid panel; Future -     Comprehensive metabolic panel; Future -     TSH; Future  Hypokalemia- this is related to the diuretic therapy, I will recheck her potassium level today and will treat if indicated, will also screen for hypomagnesemia -     Magnesium; Future  Essential hypertension- she has a chronic cough related to the ACE inhibitor therapy, will discontinue enalapril. Will continue metoprolol and will control her blood pressure with a combination of an ARB and hydrochlorothiazide. I will monitor her electrolytes and renal function. -     Magnesium; Future -     telmisartan-hydrochlorothiazide (MICARDIS HCT) 40-12.5 MG tablet; Take 1 tablet by mouth daily. -     metoprolol tartrate (LOPRESSOR) 25 MG tablet; Take 1 tablet (25 mg total) by mouth 2 (two) times daily.   I have discontinued [name] [name]'s multivitamin with minerals, enalapril-hydrochlorothiazide, and Vitamins/Minerals. I have also changed her metoprolol tartrate. Additionally, I am having her start on telmisartan-hydrochlorothiazide. Lastly, I am having her maintain her oxyCODONE-acetaminophen, DULoxetine,  estradiol, gabapentin, and gabapentin.  Meds ordered this encounter  Medications  . estradiol (ESTRACE) 0.5 MG tablet    Sig: TK 1 T PO QD  . gabapentin (NEURONTIN) 300 MG capsule  . gabapentin (NEURONTIN) 100 MG capsule  . DISCONTD: Vitamins/Minerals TABS    Sig: Take by mouth.  . telmisartan-hydrochlorothiazide (MICARDIS HCT) 40-12.5 MG tablet    Sig: Take 1 tablet by mouth daily.    Dispense:  90 tablet    Refill:  1  . metoprolol tartrate (LOPRESSOR) 25 MG tablet    Sig: Take 1 tablet (25 mg total) by mouth 2 (two) times daily.    Dispense:  180 tablet    Refill:  1     Follow-up: Return in about 6 months (around 09/23/2016).  [name] Calico, MD

## 2016-03-27 DIAGNOSIS — M5127 Other intervertebral disc displacement, lumbosacral region: Secondary | ICD-10-CM | POA: Diagnosis not present

## 2016-03-29 DIAGNOSIS — M5416 Radiculopathy, lumbar region: Secondary | ICD-10-CM | POA: Diagnosis not present

## 2016-03-29 DIAGNOSIS — M5116 Intervertebral disc disorders with radiculopathy, lumbar region: Secondary | ICD-10-CM | POA: Diagnosis not present

## 2016-04-08 ENCOUNTER — Encounter: Payer: Self-pay | Admitting: Internal Medicine

## 2016-04-08 ENCOUNTER — Other Ambulatory Visit (INDEPENDENT_AMBULATORY_CARE_PROVIDER_SITE_OTHER): Payer: Commercial Managed Care - PPO

## 2016-04-08 DIAGNOSIS — E876 Hypokalemia: Secondary | ICD-10-CM | POA: Diagnosis not present

## 2016-04-08 DIAGNOSIS — Z0000 Encounter for general adult medical examination without abnormal findings: Secondary | ICD-10-CM

## 2016-04-08 DIAGNOSIS — I10 Essential (primary) hypertension: Secondary | ICD-10-CM

## 2016-04-08 LAB — COMPREHENSIVE METABOLIC PANEL
ALT: 19 U/L (ref 0–35)
AST: 13 U/L (ref 0–37)
Albumin: 4.1 g/dL (ref 3.5–5.2)
Alkaline Phosphatase: 59 U/L (ref 39–117)
BUN: 17 mg/dL (ref 6–23)
CO2: 32 mEq/L (ref 19–32)
Calcium: 9.8 mg/dL (ref 8.4–10.5)
Chloride: 105 mEq/L (ref 96–112)
Creatinine, Ser: 0.78 mg/dL (ref 0.40–1.20)
GFR: 81.41 mL/min (ref >60.00)
Glucose, Bld: 107 mg/dL — ABNORMAL HIGH (ref 70–99)
Potassium: 4 mEq/L (ref 3.5–5.1)
Sodium: 142 mEq/L (ref 135–145)
Total Bilirubin: 0.6 mg/dL (ref 0.2–1.2)
Total Protein: 7 g/dL (ref 6.0–8.3)

## 2016-04-08 LAB — LIPID PANEL
Cholesterol: 202 mg/dL — ABNORMAL HIGH (ref 0–200)
HDL: 47.5 mg/dL (ref >39.00)
LDL Cholesterol: 137 mg/dL — ABNORMAL HIGH (ref 0–99)
NonHDL: 154.65
Total CHOL/HDL Ratio: 4
Triglycerides: 87 mg/dL (ref 0.0–149.0)
VLDL: 17.4 mg/dL (ref 0.0–40.0)

## 2016-04-08 LAB — TSH: TSH: 1.77 u[IU]/mL (ref 0.35–4.50)

## 2016-04-08 LAB — MAGNESIUM: Magnesium: 2.4 mg/dL (ref 1.5–2.5)

## 2016-05-10 ENCOUNTER — Other Ambulatory Visit: Payer: Self-pay | Admitting: Internal Medicine

## 2016-07-05 DIAGNOSIS — M4726 Other spondylosis with radiculopathy, lumbar region: Secondary | ICD-10-CM | POA: Diagnosis not present

## 2016-07-05 DIAGNOSIS — M5116 Intervertebral disc disorders with radiculopathy, lumbar region: Secondary | ICD-10-CM | POA: Diagnosis not present

## 2016-07-05 DIAGNOSIS — M5126 Other intervertebral disc displacement, lumbar region: Secondary | ICD-10-CM | POA: Diagnosis not present

## 2016-09-22 ENCOUNTER — Other Ambulatory Visit: Payer: Self-pay | Admitting: Internal Medicine

## 2016-09-22 DIAGNOSIS — I10 Essential (primary) hypertension: Secondary | ICD-10-CM

## 2016-11-06 ENCOUNTER — Other Ambulatory Visit: Payer: Self-pay | Admitting: Internal Medicine

## 2016-12-13 DIAGNOSIS — Z01419 Encounter for gynecological examination (general) (routine) without abnormal findings: Secondary | ICD-10-CM | POA: Diagnosis not present

## 2016-12-13 DIAGNOSIS — Z1231 Encounter for screening mammogram for malignant neoplasm of breast: Secondary | ICD-10-CM | POA: Diagnosis not present

## 2016-12-13 LAB — HM PAP SMEAR

## 2016-12-24 ENCOUNTER — Other Ambulatory Visit: Payer: Self-pay | Admitting: Internal Medicine

## 2016-12-24 DIAGNOSIS — I10 Essential (primary) hypertension: Secondary | ICD-10-CM

## 2016-12-25 ENCOUNTER — Encounter: Payer: Self-pay | Admitting: Internal Medicine

## 2016-12-25 LAB — HM MAMMOGRAPHY: HM Mammogram: NORMAL (ref 0–4)

## 2017-01-16 ENCOUNTER — Ambulatory Visit (INDEPENDENT_AMBULATORY_CARE_PROVIDER_SITE_OTHER): Payer: Commercial Managed Care - PPO | Admitting: Internal Medicine

## 2017-01-16 ENCOUNTER — Encounter: Payer: Self-pay | Admitting: Internal Medicine

## 2017-01-16 ENCOUNTER — Other Ambulatory Visit (INDEPENDENT_AMBULATORY_CARE_PROVIDER_SITE_OTHER): Payer: Commercial Managed Care - PPO

## 2017-01-16 VITALS — BP 110/70 | HR 67 | Temp 98.5°F | Resp 16 | Ht 66.0 in | Wt 197.0 lb

## 2017-01-16 DIAGNOSIS — R4789 Other speech disturbances: Secondary | ICD-10-CM

## 2017-01-16 DIAGNOSIS — I10 Essential (primary) hypertension: Secondary | ICD-10-CM | POA: Diagnosis not present

## 2017-01-16 LAB — BASIC METABOLIC PANEL
BUN: 12 mg/dL (ref 6–23)
CO2: 28 mEq/L (ref 19–32)
Calcium: 10 mg/dL (ref 8.4–10.5)
Chloride: 103 mEq/L (ref 96–112)
Creatinine, Ser: 0.71 mg/dL (ref 0.40–1.20)
GFR: 90.48 mL/min (ref >60.00)
Glucose, Bld: 115 mg/dL — ABNORMAL HIGH (ref 70–99)
Potassium: 3.9 mEq/L (ref 3.5–5.1)
Sodium: 139 mEq/L (ref 135–145)

## 2017-01-16 NOTE — Progress Notes (Signed)
Subjective:  Patient ID: [name] [name], female    DOB: 05-31-1960  Age: 56 y.o. MRN: [id number]  CC: Hypertension   HPI [name] [name] presents for a BP check - she complains for the last year and a half that she has been in a brain fog.  She has had mild ataxia, difficulty finding her words, and at times noticing that her words come out jumbled and incoherent.  She denies any recent episodes of headache, visual disturbance, numbness, weakness, tingling, or head injury.  She does complain that her scalp occasionally tingles.  She previously thought her symptoms were related to Tylenol PM so she stopped taking it and the symptoms improved somewhat.  Outpatient Medications Prior to Visit  Medication Sig Dispense Refill  . DULoxetine (CYMBALTA) 60 MG capsule TAKE 1 CAPSULE DAILY 90 capsule 1  . estradiol (ESTRACE) 0.5 MG tablet TK 1 T PO QD    . gabapentin (NEURONTIN) 100 MG capsule     . gabapentin (NEURONTIN) 300 MG capsule     . metoprolol tartrate (LOPRESSOR) 25 MG tablet Take 1 tablet (25 mg total) by mouth 2 (two) times daily. 180 tablet 1  . telmisartan-hydrochlorothiazide (MICARDIS HCT) 40-12.5 MG tablet Take 1 tablet by mouth daily. 90 tablet 0  . oxyCODONE-acetaminophen (PERCOCET/ROXICET) 5-325 MG tablet Take 1-2 tablets by mouth every 4 (four) hours as needed for severe pain (moderate to severe pain (when tolerating fluids)). 30 tablet 0   No facility-administered medications prior to visit.     ROS Review of Systems  Constitutional: Negative.  Negative for appetite change, chills, diaphoresis and fatigue.  HENT: Negative.  Negative for trouble swallowing.   Eyes: Negative for photophobia and visual disturbance.  Respiratory: Negative.  Negative for cough, chest tightness, shortness of breath and wheezing.   Cardiovascular: Negative.  Negative for chest pain, palpitations and leg swelling.  Gastrointestinal: Negative for abdominal pain, constipation, diarrhea, nausea and  vomiting.  Endocrine: Negative.   Genitourinary: Negative.  Negative for difficulty urinating.  Musculoskeletal: Positive for gait problem. Negative for back pain, myalgias and neck pain.  Skin: Negative.   Allergic/Immunologic: Negative.   Neurological: Negative for dizziness, facial asymmetry, speech difficulty, weakness, numbness and headaches.  Hematological: Negative for adenopathy. Does not bruise/bleed easily.  Psychiatric/Behavioral: Negative for confusion, decreased concentration, dysphoric mood and sleep disturbance. The patient is not nervous/anxious and is not hyperactive.     Objective:  BP 110/70 (BP Location: Left Arm, Patient Position: Sitting, Cuff Size: Large)   Pulse 67   Temp 98.5 F (36.9 C) (Oral)   Resp 16   Ht 5\' 6"  (1.676 m)   Wt 197 lb (89.4 kg)   LMP  (LMP Unknown)   SpO2 98%   BMI 31.80 kg/m   BP Readings from Last 3 Encounters:  01/16/17 110/70  03/26/16 124/84  01/30/16 121/71    Wt Readings from Last 3 Encounters:  01/16/17 197 lb (89.4 kg)  03/26/16 194 lb (88 kg)  01/30/16 198 lb (89.8 kg)    Physical Exam  Constitutional: She is oriented to person, place, and time. No distress.  HENT:  Mouth/Throat: No oropharyngeal exudate.  Eyes: Conjunctivae are normal. Right eye exhibits no discharge. Left eye exhibits no discharge. No scleral icterus.  Neck: Normal range of motion. Neck supple. No JVD present. No thyromegaly present.  Cardiovascular: Normal rate, regular rhythm and intact distal pulses. Exam reveals no gallop and no friction rub.  No murmur heard. Pulmonary/Chest: Effort normal and  breath sounds normal. No respiratory distress. She has no wheezes. She has no rales. She exhibits no tenderness.  Abdominal: Soft. Bowel sounds are normal. She exhibits no distension and no mass. There is no tenderness. There is no rebound and no guarding.  Musculoskeletal: Normal range of motion. She exhibits no edema, tenderness or deformity.    Lymphadenopathy:    She has no cervical adenopathy.  Neurological: She is alert and oriented to person, place, and time. She has normal reflexes. She displays normal reflexes. No cranial nerve deficit. She exhibits normal muscle tone. Coordination normal.  Skin: Skin is warm and dry. No rash noted. She is not diaphoretic. No erythema. No pallor.  Vitals reviewed.   Lab Results  Component Value Date   WBC 6.9 01/30/2016   HGB 14.9 01/30/2016   HCT 43.7 01/30/2016   PLT 311 01/30/2016   GLUCOSE 115 (H) 01/16/2017   CHOL 202 (H) 04/08/2016   TRIG 87.0 04/08/2016   HDL 47.50 04/08/2016   LDLCALC 137 (H) 04/08/2016   ALT 19 04/08/2016   AST 13 04/08/2016   NA 139 01/16/2017   K 3.9 01/16/2017   CL 103 01/16/2017   CREATININE 0.71 01/16/2017   BUN 12 01/16/2017   CO2 28 01/16/2017   TSH 1.77 04/08/2016   HGBA1C 5.3 08/27/2015    No results found.  Assessment & Plan:   Shatiqua was seen today for hypertension.  Diagnoses and all orders for this visit:  Essential hypertension-blood pressure is adequately well controlled.  Electrolytes and renal function are normal. -     Basic metabolic panel; Future  Word finding difficulty- she is under quite a bit of stress with an ill husband so anxiety/depression may be a component here but I have asked her to see neurology to be certain that there isn't a neurological cause.  Will continue Cymbalta at the current dose. -     Ambulatory referral to Neurology   I have discontinued Karole H. Carandang's oxyCODONE-acetaminophen. I am also having her maintain her estradiol, gabapentin, gabapentin, metoprolol tartrate, DULoxetine, and telmisartan-hydrochlorothiazide.  No orders of the defined types were placed in this encounter.    Follow-up: Return in about 6 months (around 07/16/2017).  Scarlette Calico, MD

## 2017-01-16 NOTE — Patient Instructions (Signed)

## 2017-02-17 DIAGNOSIS — M4726 Other spondylosis with radiculopathy, lumbar region: Secondary | ICD-10-CM | POA: Diagnosis not present

## 2017-02-17 DIAGNOSIS — M5416 Radiculopathy, lumbar region: Secondary | ICD-10-CM | POA: Diagnosis not present

## 2017-02-17 DIAGNOSIS — M5136 Other intervertebral disc degeneration, lumbar region: Secondary | ICD-10-CM | POA: Diagnosis not present

## 2017-02-17 DIAGNOSIS — M48061 Spinal stenosis, lumbar region without neurogenic claudication: Secondary | ICD-10-CM | POA: Diagnosis not present

## 2017-04-08 ENCOUNTER — Ambulatory Visit: Payer: Self-pay | Admitting: Diagnostic Neuroimaging

## 2017-04-19 ENCOUNTER — Other Ambulatory Visit: Payer: Self-pay | Admitting: Internal Medicine

## 2017-04-19 DIAGNOSIS — I10 Essential (primary) hypertension: Secondary | ICD-10-CM

## 2017-05-05 ENCOUNTER — Other Ambulatory Visit: Payer: Self-pay | Admitting: Internal Medicine

## 2017-05-10 DIAGNOSIS — R6883 Chills (without fever): Secondary | ICD-10-CM | POA: Diagnosis not present

## 2017-05-10 DIAGNOSIS — J101 Influenza due to other identified influenza virus with other respiratory manifestations: Secondary | ICD-10-CM | POA: Diagnosis not present

## 2017-05-10 DIAGNOSIS — J069 Acute upper respiratory infection, unspecified: Secondary | ICD-10-CM | POA: Diagnosis not present

## 2017-07-25 DIAGNOSIS — D2271 Melanocytic nevi of right lower limb, including hip: Secondary | ICD-10-CM | POA: Diagnosis not present

## 2017-07-25 DIAGNOSIS — L821 Other seborrheic keratosis: Secondary | ICD-10-CM | POA: Diagnosis not present

## 2017-07-25 DIAGNOSIS — L814 Other melanin hyperpigmentation: Secondary | ICD-10-CM | POA: Diagnosis not present

## 2017-10-05 ENCOUNTER — Other Ambulatory Visit: Payer: Self-pay | Admitting: Internal Medicine

## 2017-10-05 DIAGNOSIS — I10 Essential (primary) hypertension: Secondary | ICD-10-CM

## 2017-10-07 ENCOUNTER — Ambulatory Visit (INDEPENDENT_AMBULATORY_CARE_PROVIDER_SITE_OTHER): Payer: Commercial Managed Care - PPO | Admitting: Internal Medicine

## 2017-10-07 ENCOUNTER — Encounter: Payer: Self-pay | Admitting: Internal Medicine

## 2017-10-07 DIAGNOSIS — B9789 Other viral agents as the cause of diseases classified elsewhere: Secondary | ICD-10-CM | POA: Diagnosis not present

## 2017-10-07 DIAGNOSIS — J069 Acute upper respiratory infection, unspecified: Secondary | ICD-10-CM | POA: Insufficient documentation

## 2017-10-07 MED ORDER — BENZONATATE 200 MG PO CAPS: 200.0000 mg | ORAL_CAPSULE | Freq: Three times a day (TID) | ORAL | 0 refills | Status: DC | PRN

## 2017-10-07 NOTE — Progress Notes (Signed)
   Subjective:    Patient ID: [name] [name], female    DOB: 1961/01/17, 57 y.o.   MRN: [id number]  HPI The patient is a 57 YO female coming in for possible sinus infection. Started about 4 days ago with some sore throat. She is having drainage of yellow to clear. Also some sinus pressure and headaches. Mild fevers or chills but has not taken temperature. Does have some ear fullness. Denies problems swallowing or hard time breathing. Sore throat is now gone. Drainage is still present. Some dry cough. Denies SOB. Started claritin 2 days ago. Has some flonase at home which she has not used. Works in [employer]rap[employer], art on the phone.   Review of Systems  Constitutional: Positive for activity change, appetite change, chills and fatigue. Negative for fever and unexpected weight change.  HENT: Positive for congestion, ear pain, postnasal drip, rhinorrhea and sinus pressure. Negative for ear discharge, sinus pain, sneezing, sore throat, tinnitus, trouble swallowing and voice change.   Eyes: Negative.   Respiratory: Positive for cough. Negative for chest tightness, shortness of breath and wheezing.   Cardiovascular: Negative.   Gastrointestinal: Negative.   Musculoskeletal: Positive for myalgias.  Neurological: Negative.       Objective:   Physical Exam  Constitutional: She is oriented to person, place, and time. She appears well-developed and well-nourished.  HENT:  Head: Normocephalic and atraumatic.  Oropharynx with redness and clear drainage, nose with swollen turbinates, TMs normal bilaterally  Eyes: EOM are normal.  Neck: Normal range of motion. No thyromegaly present.  Cardiovascular: Normal rate and regular rhythm.  Pulmonary/Chest: Effort normal and breath sounds normal. No respiratory distress. She has no wheezes. She has no rales.  Abdominal: Soft.  Musculoskeletal: She exhibits no tenderness.  Lymphadenopathy:    She has no cervical adenopathy.  Neurological: She is alert and oriented  to person, place, and time.  Skin: Skin is warm and dry.   Vitals:   10/07/17 1517  BP: 118/80  Pulse: 78  Temp: 98 F (36.7 C)  TempSrc: Oral  SpO2: 95%  Weight: 201 lb (91.2 kg)  Height: 5\' 6"  (1.676 m)      Assessment & Plan:

## 2017-10-07 NOTE — Assessment & Plan Note (Signed)
Rx for tessalon perles. Likely viral in etiology. Advised to continue claritin and start the flonase. Advised of typical course and duration of symptoms. Return to work as able. Given hand hygiene information.

## 2017-10-07 NOTE — Patient Instructions (Addendum)
Use the nose spray that you have, 2 sprays in each nostril once per day.   We have sent in tessalon perles to use for cough up to 3 times per day.    Upper Respiratory Infection, Adult Most upper respiratory infections (URIs) are caused by a virus. A URI affects the nose, throat, and upper air passages. The most common type of URI is often called "the common cold." Follow these instructions at home:  Take medicines only as told by your doctor.  Gargle warm saltwater or take cough drops to comfort your throat as told by your doctor.  Use a warm mist humidifier or inhale steam from a shower to increase air moisture. This may make it easier to breathe.  Drink enough fluid to keep your pee (urine) clear or pale yellow.  Eat soups and other clear broths.  Have a healthy diet.  Rest as needed.  Go back to work when your fever is gone or your doctor says it is okay. ? You may need to stay home longer to avoid giving your URI to others. ? You can also wear a face mask and wash your hands often to prevent spread of the virus.  Use your inhaler more if you have asthma.  Do not use any tobacco products, including cigarettes, chewing tobacco, or electronic cigarettes. If you need help quitting, ask your doctor. Contact a doctor if:  You are getting worse, not better.  Your symptoms are not helped by medicine.  You have chills.  You are getting more short of breath.  You have brown or red mucus.  You have yellow or brown discharge from your nose.  You have pain in your face, especially when you bend forward.  You have a fever.  You have puffy (swollen) neck glands.  You have pain while swallowing.  You have white areas in the back of your throat. Get help right away if:  You have very bad or constant: ? Headache. ? Ear pain. ? Pain in your forehead, behind your eyes, and over your cheekbones (sinus pain). ? Chest pain.  You have long-lasting (chronic) lung disease and  any of the following: ? Wheezing. ? Long-lasting cough. ? Coughing up blood. ? A change in your usual mucus.  You have a stiff neck.  You have changes in your: ? Vision. ? Hearing. ? Thinking. ? Mood. This information is not intended to replace advice given to you by your health care provider. Make sure you discuss any questions you have with your health care provider. Document Released: 08/21/2007 Document Revised: 11/05/2015 Document Reviewed: 06/09/2013 Elsevier Interactive Patient Education  2018 [employer].

## 2017-10-09 ENCOUNTER — Other Ambulatory Visit: Payer: Self-pay | Admitting: Internal Medicine

## 2017-10-09 DIAGNOSIS — I10 Essential (primary) hypertension: Secondary | ICD-10-CM

## 2017-10-29 ENCOUNTER — Other Ambulatory Visit (INDEPENDENT_AMBULATORY_CARE_PROVIDER_SITE_OTHER): Payer: Commercial Managed Care - PPO

## 2017-10-29 ENCOUNTER — Encounter: Payer: Self-pay | Admitting: Internal Medicine

## 2017-10-29 ENCOUNTER — Ambulatory Visit (INDEPENDENT_AMBULATORY_CARE_PROVIDER_SITE_OTHER): Payer: Commercial Managed Care - PPO | Admitting: Internal Medicine

## 2017-10-29 VITALS — BP 110/78 | HR 65 | Temp 98.5°F | Ht 66.0 in | Wt 201.8 lb

## 2017-10-29 DIAGNOSIS — Z0000 Encounter for general adult medical examination without abnormal findings: Secondary | ICD-10-CM

## 2017-10-29 DIAGNOSIS — K2100 Gastro-esophageal reflux disease with esophagitis, without bleeding: Secondary | ICD-10-CM

## 2017-10-29 DIAGNOSIS — I10 Essential (primary) hypertension: Secondary | ICD-10-CM

## 2017-10-29 LAB — COMPREHENSIVE METABOLIC PANEL
ALT: 35 U/L (ref 0–35)
AST: 26 U/L (ref 0–37)
Albumin: 4 g/dL (ref 3.5–5.2)
Alkaline Phosphatase: 52 U/L (ref 39–117)
BUN: 14 mg/dL (ref 6–23)
CO2: 30 mEq/L (ref 19–32)
Calcium: 9.9 mg/dL (ref 8.4–10.5)
Chloride: 101 mEq/L (ref 96–112)
Creatinine, Ser: 0.76 mg/dL (ref 0.40–1.20)
GFR: 83.41 mL/min (ref >60.00)
Glucose, Bld: 112 mg/dL — ABNORMAL HIGH (ref 70–99)
Potassium: 3.8 mEq/L (ref 3.5–5.1)
Sodium: 139 mEq/L (ref 135–145)
Total Bilirubin: 0.5 mg/dL (ref 0.2–1.2)
Total Protein: 7 g/dL (ref 6.0–8.3)

## 2017-10-29 LAB — CBC WITH DIFFERENTIAL/PLATELET
Basophils Absolute: 0.1 10*3/uL (ref 0.0–0.1)
Basophils Relative: 1.2 % (ref 0.0–3.0)
Eosinophils Absolute: 0.2 10*3/uL (ref 0.0–0.7)
Eosinophils Relative: 2 % (ref 0.0–5.0)
HCT: 40.5 % (ref 36.0–46.0)
Hemoglobin: 13.5 g/dL (ref 12.0–15.0)
Lymphocytes Relative: 29.6 % (ref 12.0–46.0)
Lymphs Abs: 2.5 10*3/uL (ref 0.7–4.0)
MCHC: 33.3 g/dL (ref 30.0–36.0)
MCV: 84 fl (ref 78.0–100.0)
Monocytes Absolute: 0.9 10*3/uL (ref 0.1–1.0)
Monocytes Relative: 10.5 % (ref 3.0–12.0)
Neutro Abs: 4.8 10*3/uL (ref 1.4–7.7)
Neutrophils Relative %: 56.7 % (ref 43.0–77.0)
Platelets: 339 10*3/uL (ref 150.0–400.0)
RBC: 4.82 Mil/uL (ref 3.87–5.11)
RDW: 14.2 % (ref 11.5–15.5)
WBC: 8.4 10*3/uL (ref 4.0–10.5)

## 2017-10-29 LAB — URINALYSIS, ROUTINE W REFLEX MICROSCOPIC
Bilirubin Urine: NEGATIVE
Hgb urine dipstick: NEGATIVE
Ketones, ur: NEGATIVE
Leukocytes, UA: NEGATIVE
Nitrite: NEGATIVE
RBC / HPF: NONE SEEN (ref 0–?)
Specific Gravity, Urine: 1.01 (ref 1.000–1.030)
Total Protein, Urine: NEGATIVE
Urine Glucose: NEGATIVE
Urobilinogen, UA: 0.2 (ref 0.0–1.0)
pH: 6 (ref 5.0–8.0)

## 2017-10-29 LAB — LIPID PANEL
Cholesterol: 179 mg/dL (ref 0–200)
HDL: 43 mg/dL (ref >39.00)
LDL Cholesterol: 107 mg/dL — ABNORMAL HIGH (ref 0–99)
NonHDL: 135.65
Total CHOL/HDL Ratio: 4
Triglycerides: 143 mg/dL (ref 0.0–149.0)
VLDL: 28.6 mg/dL (ref 0.0–40.0)

## 2017-10-29 LAB — TSH: TSH: 2.16 u[IU]/mL (ref 0.35–4.50)

## 2017-10-29 MED ORDER — ESOMEPRAZOLE MAGNESIUM 20 MG PO PACK: 20.0000 mg | PACK | Freq: Every day | ORAL | 1 refills | Status: DC

## 2017-10-29 MED ORDER — HYDROCHLOROTHIAZIDE 12.5 MG PO CAPS: 12.5000 mg | ORAL_CAPSULE | Freq: Every day | ORAL | 1 refills | Status: DC

## 2017-10-29 NOTE — Patient Instructions (Signed)

## 2017-10-29 NOTE — Progress Notes (Signed)
Subjective:  Patient ID: [name] [name], female    DOB: 08-22-1960  Age: 57 y.o. MRN: [id number]  CC: Hypertension; Gastroesophageal Reflux; and Annual Exam   HPI [name] [name] presents for a CPX.  She says that about 2 weeks ago she had an episode of chest pain at rest.  She felt a water brash up in the upper part of her esophagus and throat.  She complains of mild, intermittent heartburn which she is not treating.  She denies odynophagia, dysphagia, loss of appetite, or weight loss.  She tells me her blood pressure has been very well controlled.  She complains of a few episode of orthostatic dizziness and lightheadedness.  She denies DOE, edema, or fatigue.  Outpatient Medications Prior to Visit  Medication Sig Dispense Refill  . DULoxetine (CYMBALTA) 60 MG capsule TAKE 1 CAPSULE DAILY 90 capsule 1  . estradiol (ESTRACE) 0.5 MG tablet TK 1 T PO QD    . gabapentin (NEURONTIN) 100 MG capsule     . metoprolol tartrate (LOPRESSOR) 25 MG tablet TAKE 1 TABLET(25 MG) BY MOUTH TWICE DAILY 180 tablet 1  . telmisartan-hydrochlorothiazide (MICARDIS HCT) 40-12.5 MG tablet TAKE 1 TABLET BY MOUTH DAILY 90 tablet 0  . benzonatate (TESSALON) 200 MG capsule Take 1 capsule (200 mg total) by mouth 3 (three) times daily as needed. 60 capsule 0   No facility-administered medications prior to visit.     ROS Review of Systems  Constitutional: Negative.  Negative for appetite change, diaphoresis, fatigue and fever.  HENT: Negative.  Negative for trouble swallowing and voice change.   Respiratory: Negative for cough, chest tightness, shortness of breath and wheezing.   Cardiovascular: Negative for chest pain, palpitations and leg swelling.  Gastrointestinal: Negative for abdominal pain, constipation, diarrhea, nausea and vomiting.  Endocrine: Negative.   Genitourinary: Negative.  Negative for difficulty urinating, dysuria and hematuria.  Musculoskeletal: Negative.  Negative for arthralgias and  myalgias.  Skin: Negative.  Negative for color change, pallor and rash.  Neurological: Positive for dizziness and light-headedness. Negative for weakness.  Hematological: Negative for adenopathy. Does not bruise/bleed easily.  Psychiatric/Behavioral: Negative.     Objective:  BP 110/78 (BP Location: Left Arm, Patient Position: Sitting, Cuff Size: Large)   Pulse 65   Temp 98.5 F (36.9 C) (Oral)   Ht 5\' 6"  (1.676 m)   Wt 201 lb 12 oz (91.5 kg)   LMP  (LMP Unknown)   SpO2 97%   BMI 32.56 kg/m   BP Readings from Last 3 Encounters:  10/29/17 110/78  10/07/17 118/80  01/16/17 110/70    Wt Readings from Last 3 Encounters:  10/29/17 201 lb 12 oz (91.5 kg)  10/07/17 201 lb (91.2 kg)  01/16/17 197 lb (89.4 kg)    Physical Exam  Constitutional: She is oriented to person, place, and time. No distress.  HENT:  Mouth/Throat: Oropharynx is clear and moist. No oropharyngeal exudate.  Eyes: Conjunctivae are normal. No scleral icterus.  Neck: Normal range of motion. Neck supple. No JVD present. No thyromegaly present.  Cardiovascular: Normal rate, regular rhythm and normal heart sounds. Exam reveals no gallop and no friction rub.  No murmur heard. Pulmonary/Chest: Effort normal and breath sounds normal. She has no wheezes. She has no rales.  Abdominal: Normal appearance. She exhibits no mass. There is no hepatosplenomegaly. There is no tenderness.  Musculoskeletal: Normal range of motion. She exhibits no edema, tenderness or deformity.  Lymphadenopathy:    She has no cervical adenopathy.  Neurological: She is alert and oriented to person, place, and time.  Skin: Skin is warm and dry. No rash noted. She is not diaphoretic. No pallor.  Psychiatric: She has a normal mood and affect. Her behavior is normal. Judgment and thought content normal.  Vitals reviewed.   Lab Results  Component Value Date   WBC 8.4 10/29/2017   HGB 13.5 10/29/2017   HCT 40.5 10/29/2017   PLT 339.0 10/29/2017    GLUCOSE 112 (H) 10/29/2017   CHOL 179 10/29/2017   TRIG 143.0 10/29/2017   HDL 43.00 10/29/2017   LDLCALC 107 (H) 10/29/2017   ALT 35 10/29/2017   AST 26 10/29/2017   NA 139 10/29/2017   K 3.8 10/29/2017   CL 101 10/29/2017   CREATININE 0.76 10/29/2017   BUN 14 10/29/2017   CO2 30 10/29/2017   TSH 2.16 10/29/2017   HGBA1C 5.3 08/27/2015    No results found.  Assessment & Plan:   [name] [name] was seen today for hypertension, gastroesophageal reflux and annual exam.  Diagnoses and all orders for this visit:  Essential hypertension- Her blood pressure is a little soft and she is symptomatic.  I have asked her to stop taking the ARB.  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  She is concerned about a history of ankle edema so will continue the thiazide diuretic at the current dose. -     hydrochlorothiazide (MICROZIDE) 12.5 MG capsule; Take 1 capsule (12.5 mg total) by mouth daily. -     CBC with Differential/Platelet; Future -     TSH; Future -     Urinalysis, Routine w reflex microscopic; Future -     Comprehensive metabolic panel; Future  Routine health maintenance- Exam completed, labs reviewed, vaccines reviewed, screening for colon cancer is up-to-date, her Pap smear and mammogram are up-to-date.  Patient education material was given. -     Lipid panel; Future  GERD with esophagitis- I have asked her to start taking a PPI for symptom relief.  She is not anemic and there are no alarming symptoms. -     esomeprazole (NEXIUM) 20 MG packet; Take 20 mg by mouth daily before breakfast. -     CBC with Differential/Platelet; Future   I have discontinued [name] [name]'s benzonatate and telmisartan-hydrochlorothiazide. I am also having her start on esomeprazole and hydrochlorothiazide. Additionally, I am having her maintain her estradiol, gabapentin, metoprolol tartrate, and DULoxetine.  Meds ordered this encounter  Medications  .  esomeprazole (NEXIUM) 20 MG packet    Sig: Take 20 mg by mouth daily before breakfast.    Dispense:  90 each    Refill:  1  . hydrochlorothiazide (MICROZIDE) 12.5 MG capsule    Sig: Take 1 capsule (12.5 mg total) by mouth daily.    Dispense:  90 capsule    Refill:  1     Follow-up: Return in about 6 months (around 05/01/2018).  Scarlette Calico, MD

## 2017-10-30 ENCOUNTER — Encounter: Payer: Self-pay | Admitting: Internal Medicine

## 2017-11-01 ENCOUNTER — Other Ambulatory Visit: Payer: Self-pay | Admitting: Internal Medicine

## 2017-11-03 ENCOUNTER — Telehealth: Payer: Self-pay

## 2017-11-03 NOTE — Telephone Encounter (Signed)
Key: [id number]

## 2017-11-10 NOTE — Telephone Encounter (Signed)
PA has been approved. Pt can call pharmacy when they are ready to pick up.

## 2017-11-10 NOTE — Telephone Encounter (Signed)
Called patient and spoke to her husband. Gave general information regarding this PA approval. He said he would give the information to [facility].

## 2017-12-31 DIAGNOSIS — Z1231 Encounter for screening mammogram for malignant neoplasm of breast: Secondary | ICD-10-CM | POA: Diagnosis not present

## 2017-12-31 DIAGNOSIS — Z01419 Encounter for gynecological examination (general) (routine) without abnormal findings: Secondary | ICD-10-CM | POA: Diagnosis not present

## 2018-01-08 DIAGNOSIS — M65341 Trigger finger, right ring finger: Secondary | ICD-10-CM | POA: Diagnosis not present

## 2018-01-12 IMAGING — CT CT ABD-PELV W/ CM
2 of 5 series · 16 of 46 positions shown, 18 images · IV contrast (ISOVUE 300)
Comparison: None

CLINICAL DATA: Extreme abdominal pain with fever, unable to void
completely, feels terrible, pain for couple weeks but unbearable on
last few days, elevated LFTs,

EXAM:
CT ABDOMEN AND PELVIS WITH CONTRAST
TECHNIQUE: Multidetector CT imaging of the abdomen and pelvis was performed
using the standard protocol following bolus administration of
intravenous contrast. Sagittal and coronal MPR images reconstructed
from axial data set.
CONTRAST:  100mL N8KGXD-8BB IOPAMIDOL (N8KGXD-8BB) INJECTION 61% IV.
Dilute oral contrast.

[Series 2: abd/ pelvis · axial · 0.75mm/px · z∈[+612,+1022]mm · 13 of 92 slices shown, 15 images]
[im 5/92  soft-tissue]
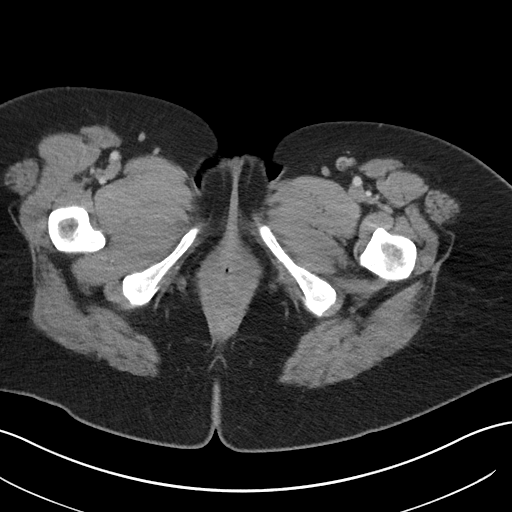
[im 5/92  bone]
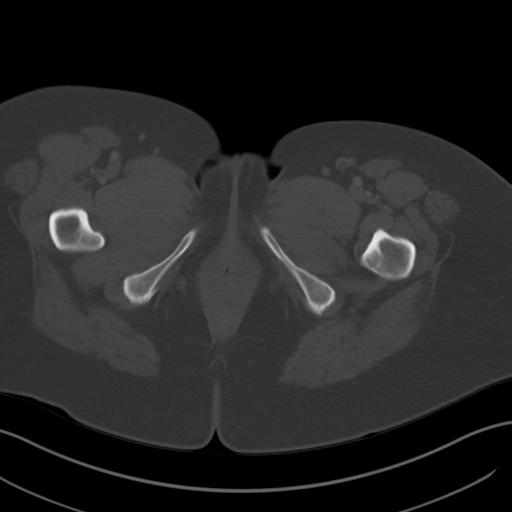
[im 15/92  soft-tissue]
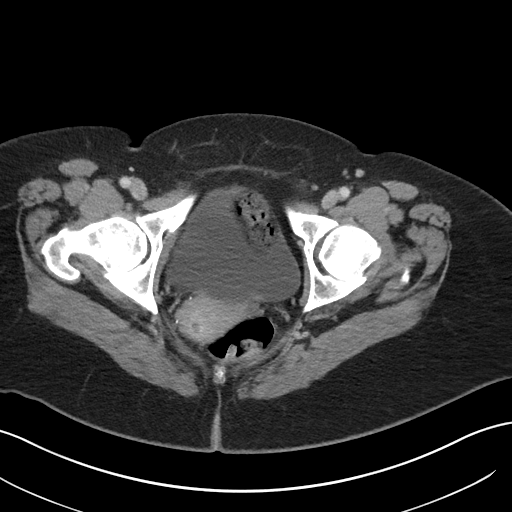
[im 20/92  soft-tissue]
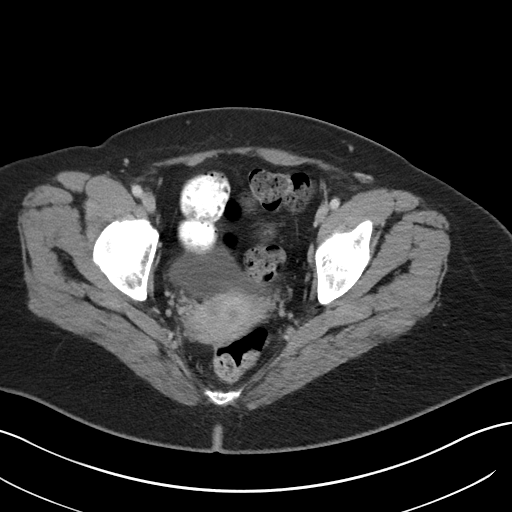
[im 24/92  soft-tissue]
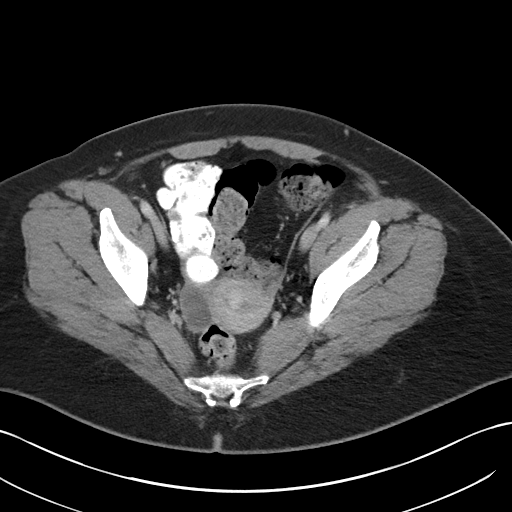
[im 34/92  soft-tissue]
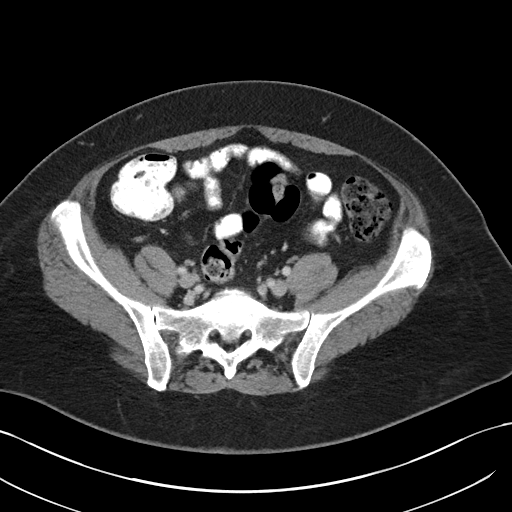
[im 39/92  soft-tissue]
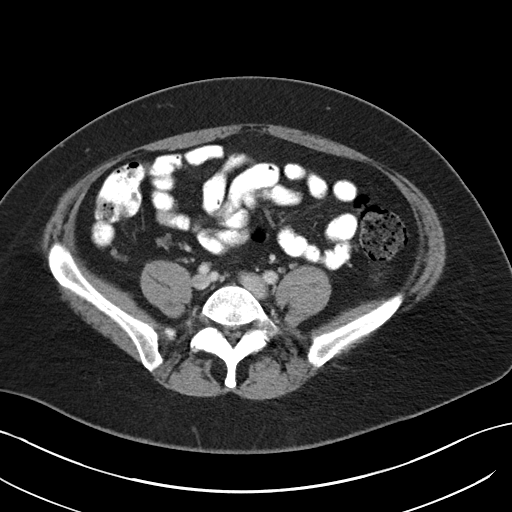
[im 48/92  soft-tissue]
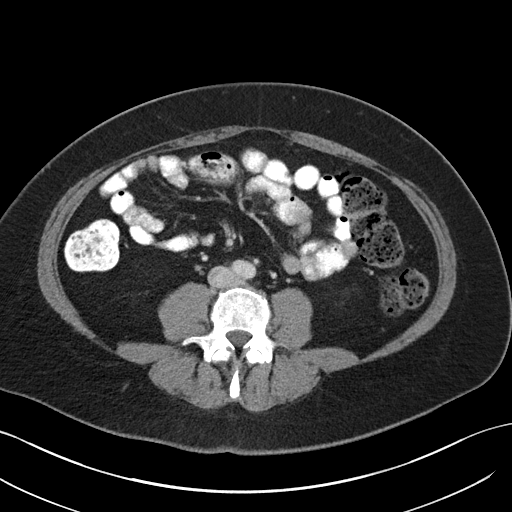
[im 53/92  soft-tissue]
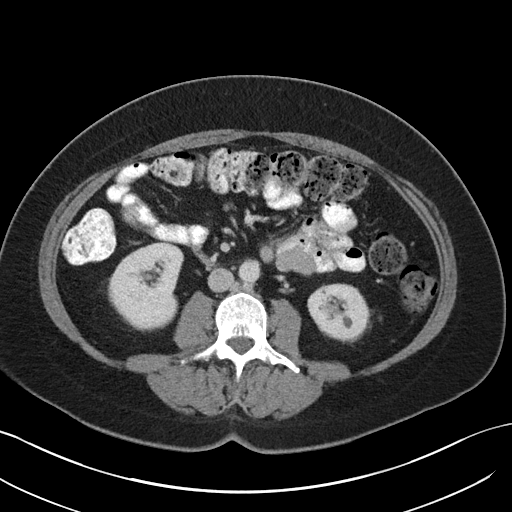
[im 58/92  soft-tissue]
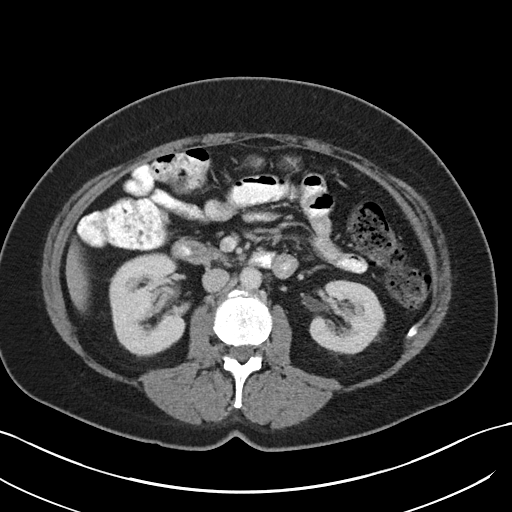
[im 58/92  bone]
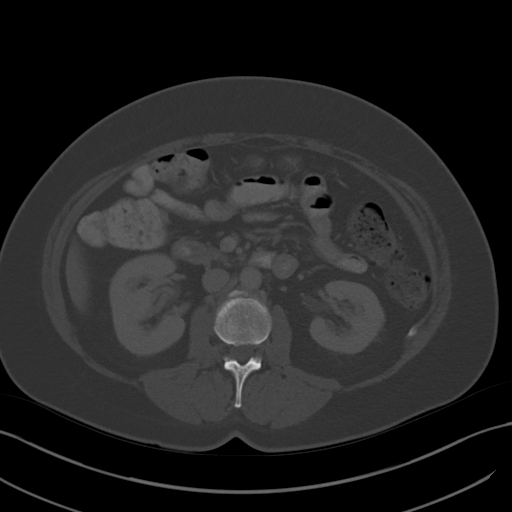
[im 68/92  soft-tissue]
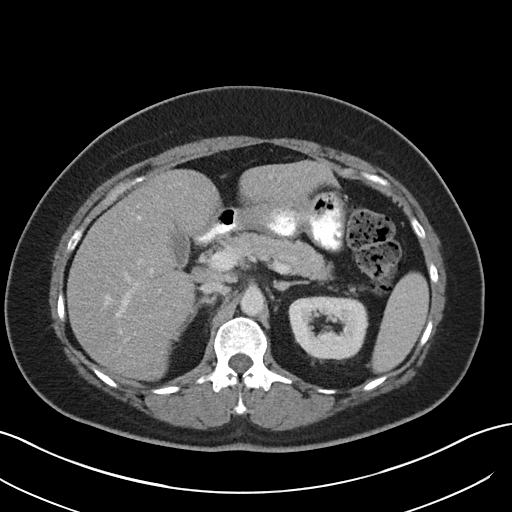
[im 72/92  soft-tissue]
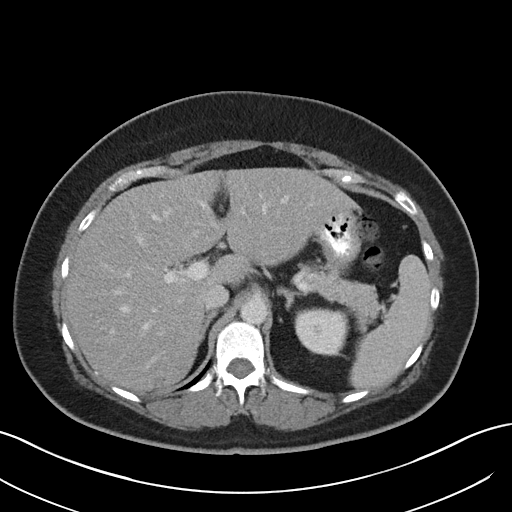
[im 77/92  soft-tissue]
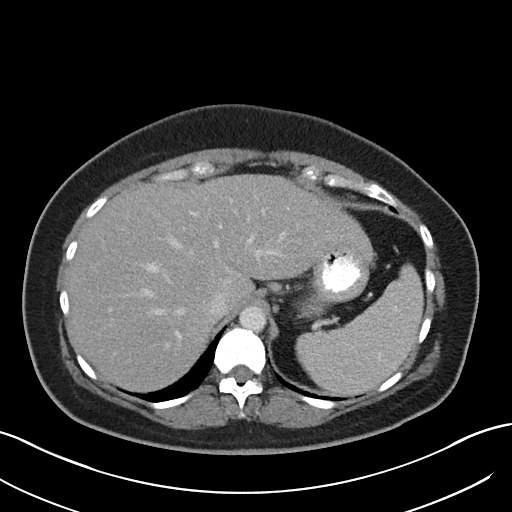
[im 87/92  soft-tissue]
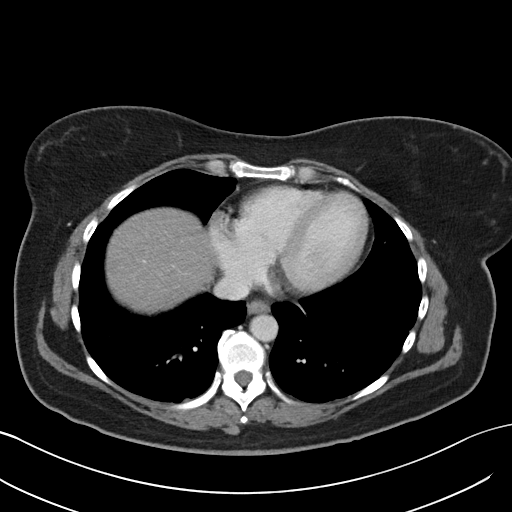

[Series 5: coronal soft tissue · coronal · 0.70mm/px · 3 of 82 slices shown]
[im 28/82  soft-tissue]
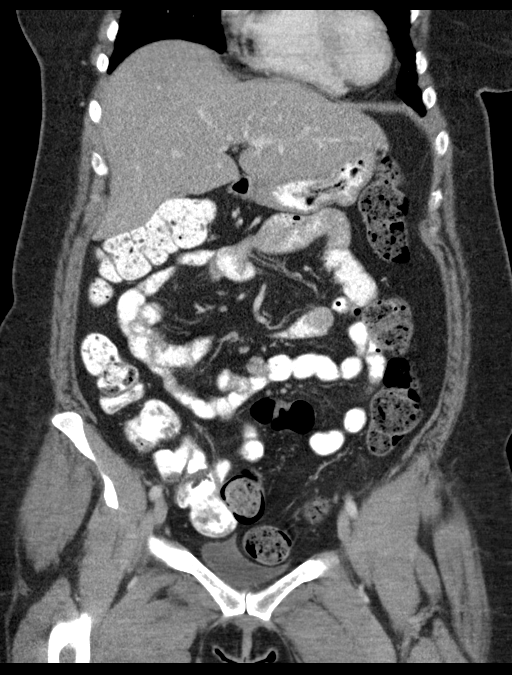
[im 37/82  soft-tissue]
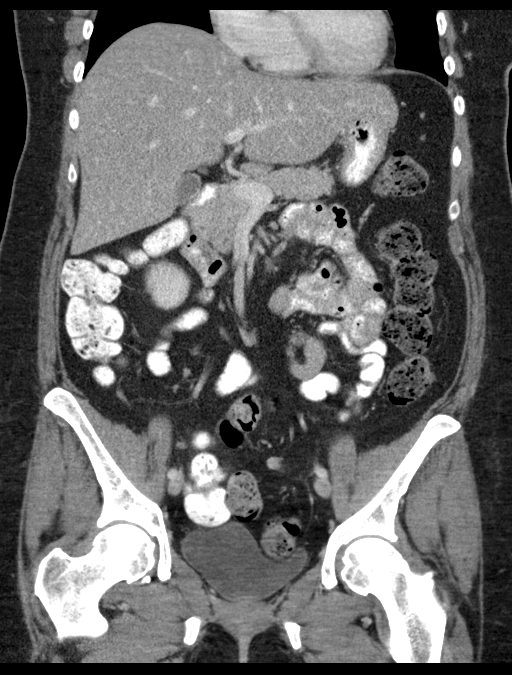
[im 46/82  soft-tissue]
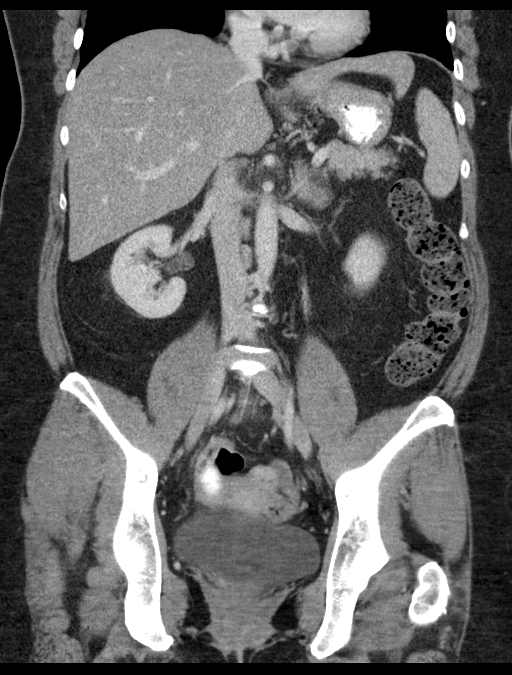

[16 of 46 positions shown; findings below may reference images not displayed]

FINDINGS: Lung bases clear.

Small cyst inferior pole LEFT kidney image 40.

Liver, gallbladder, spleen, pancreas, kidneys, and adrenal glands
otherwise normal appearance.

Small splenule at splenic hilum.

Normal appendix, bladder, ureters, and uterus.

Probable BILATERAL ovarian cysts measuring 2.3 x 1.7 cm LEFT image
65 and 3.8 x 2.1 cm RIGHT image 70.

Mildly increased stool in colon.

Stomach and bowel loops otherwise normal appearance.

No mass, adenopathy, free air, free fluid or inflammatory process
otherwise seen.

Osseous structures unremarkable.
IMPRESSION: Probable small BILATERAL ovarian cysts.

Otherwise negative exam.

## 2018-01-12 IMAGING — DX DG ABDOMEN ACUTE W/ 1V CHEST
3 series · 3 of 3 positions shown · non-contrast
Comparison: None.

CLINICAL DATA: Generalized abdominal pain. Unable to urinate. Lower
abdominal pressure, nausea. Shortness of breath.

EXAM:
DG ABDOMEN ACUTE W/ 1V CHEST

[chest pa]
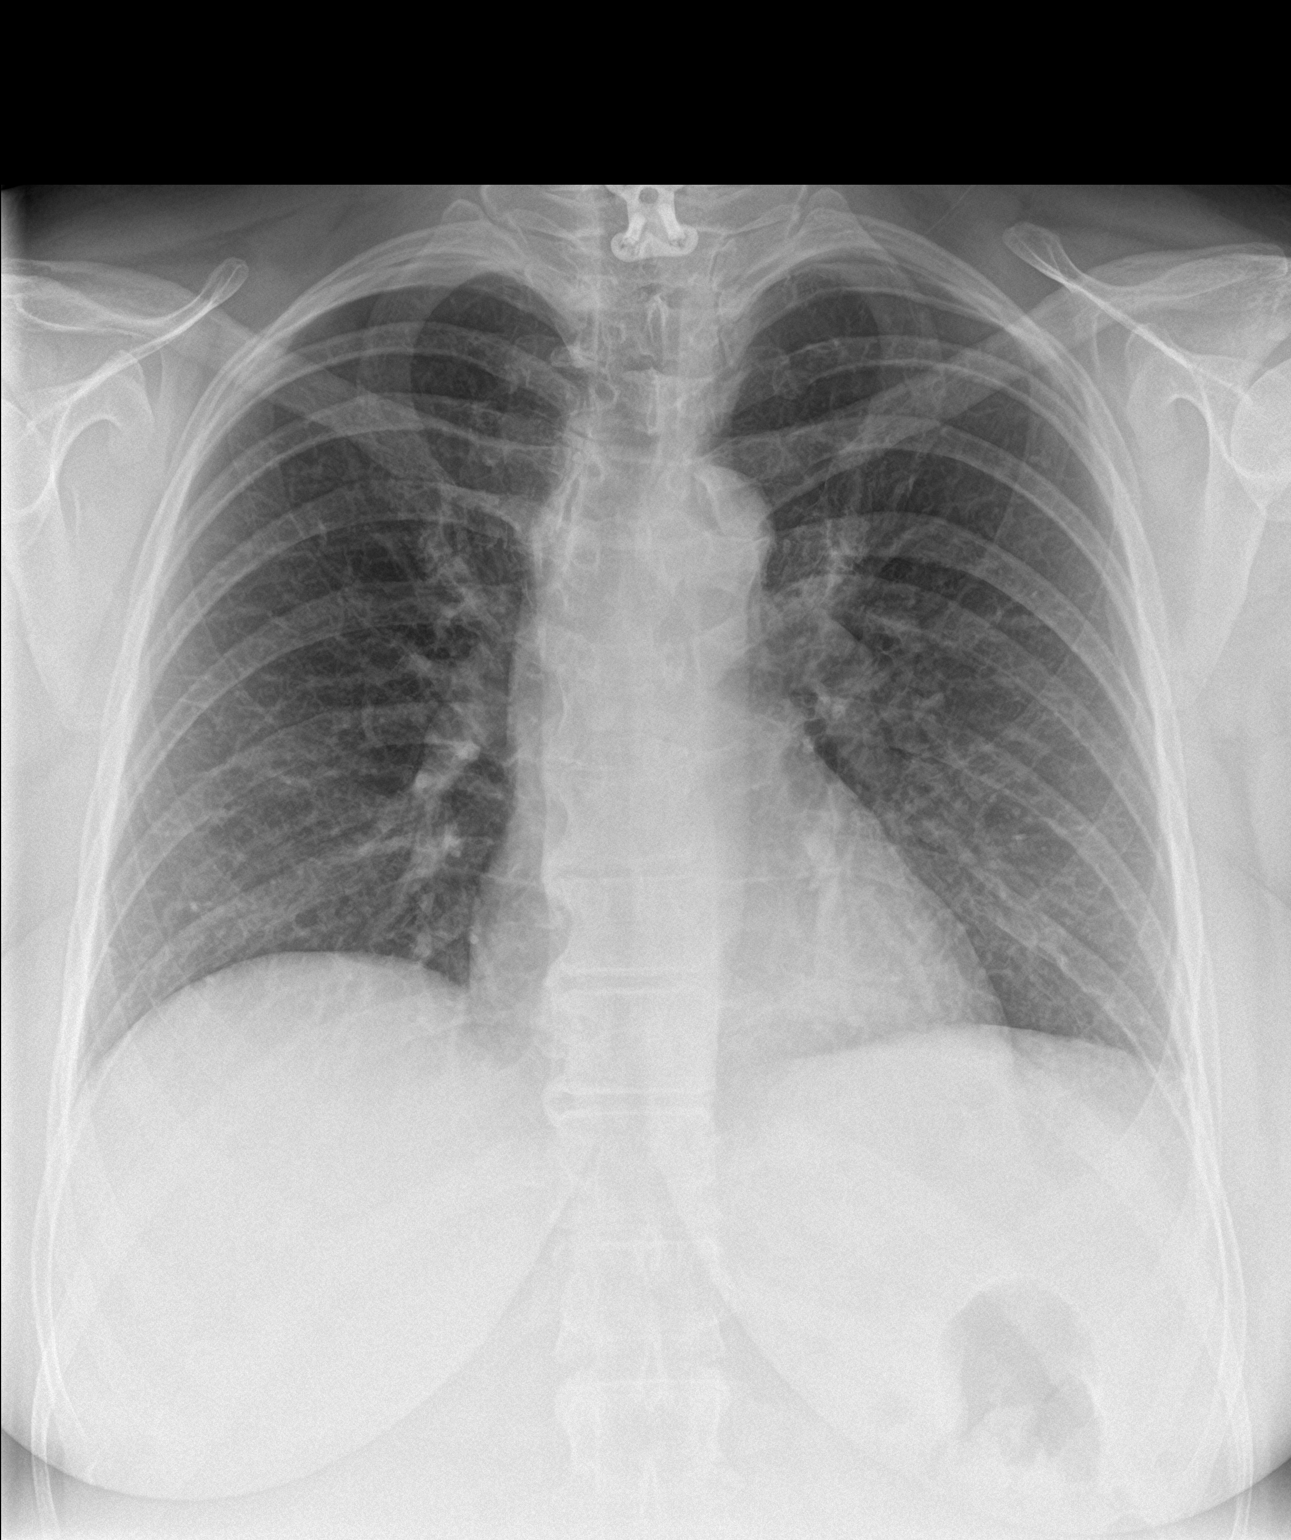

[abdomen erect]
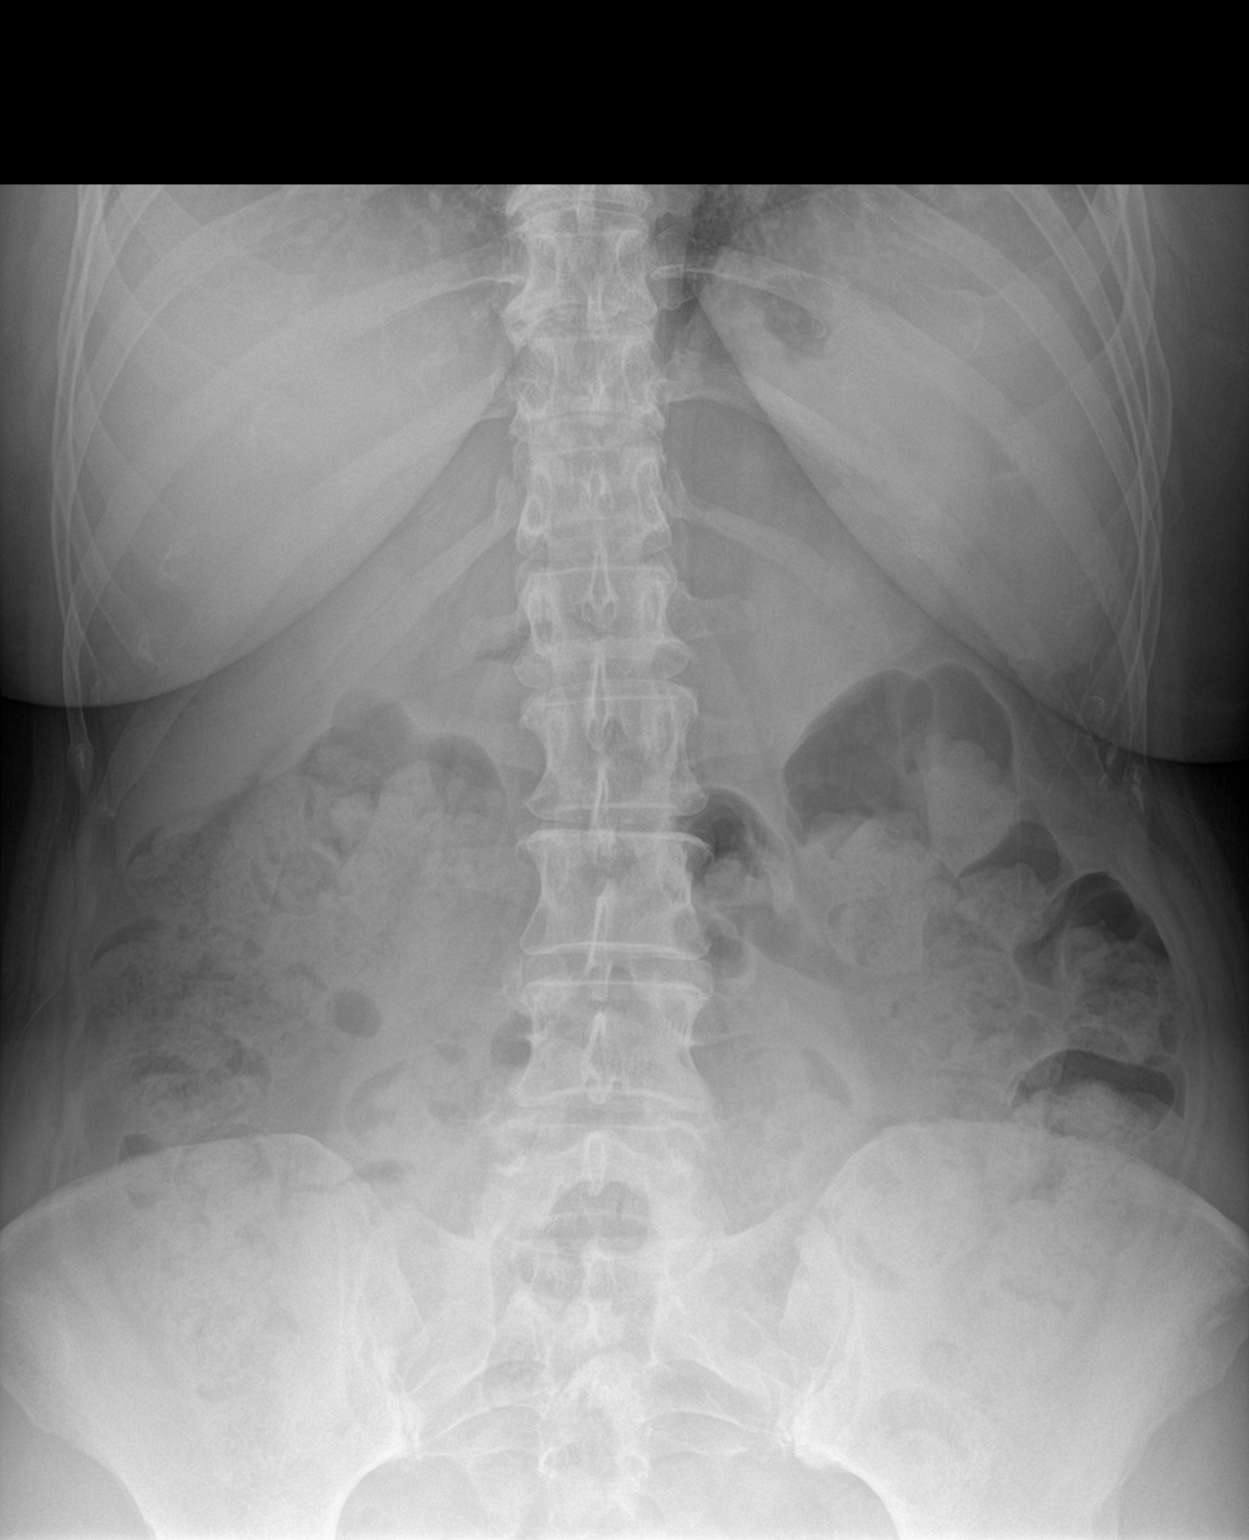

[abdomen supine]
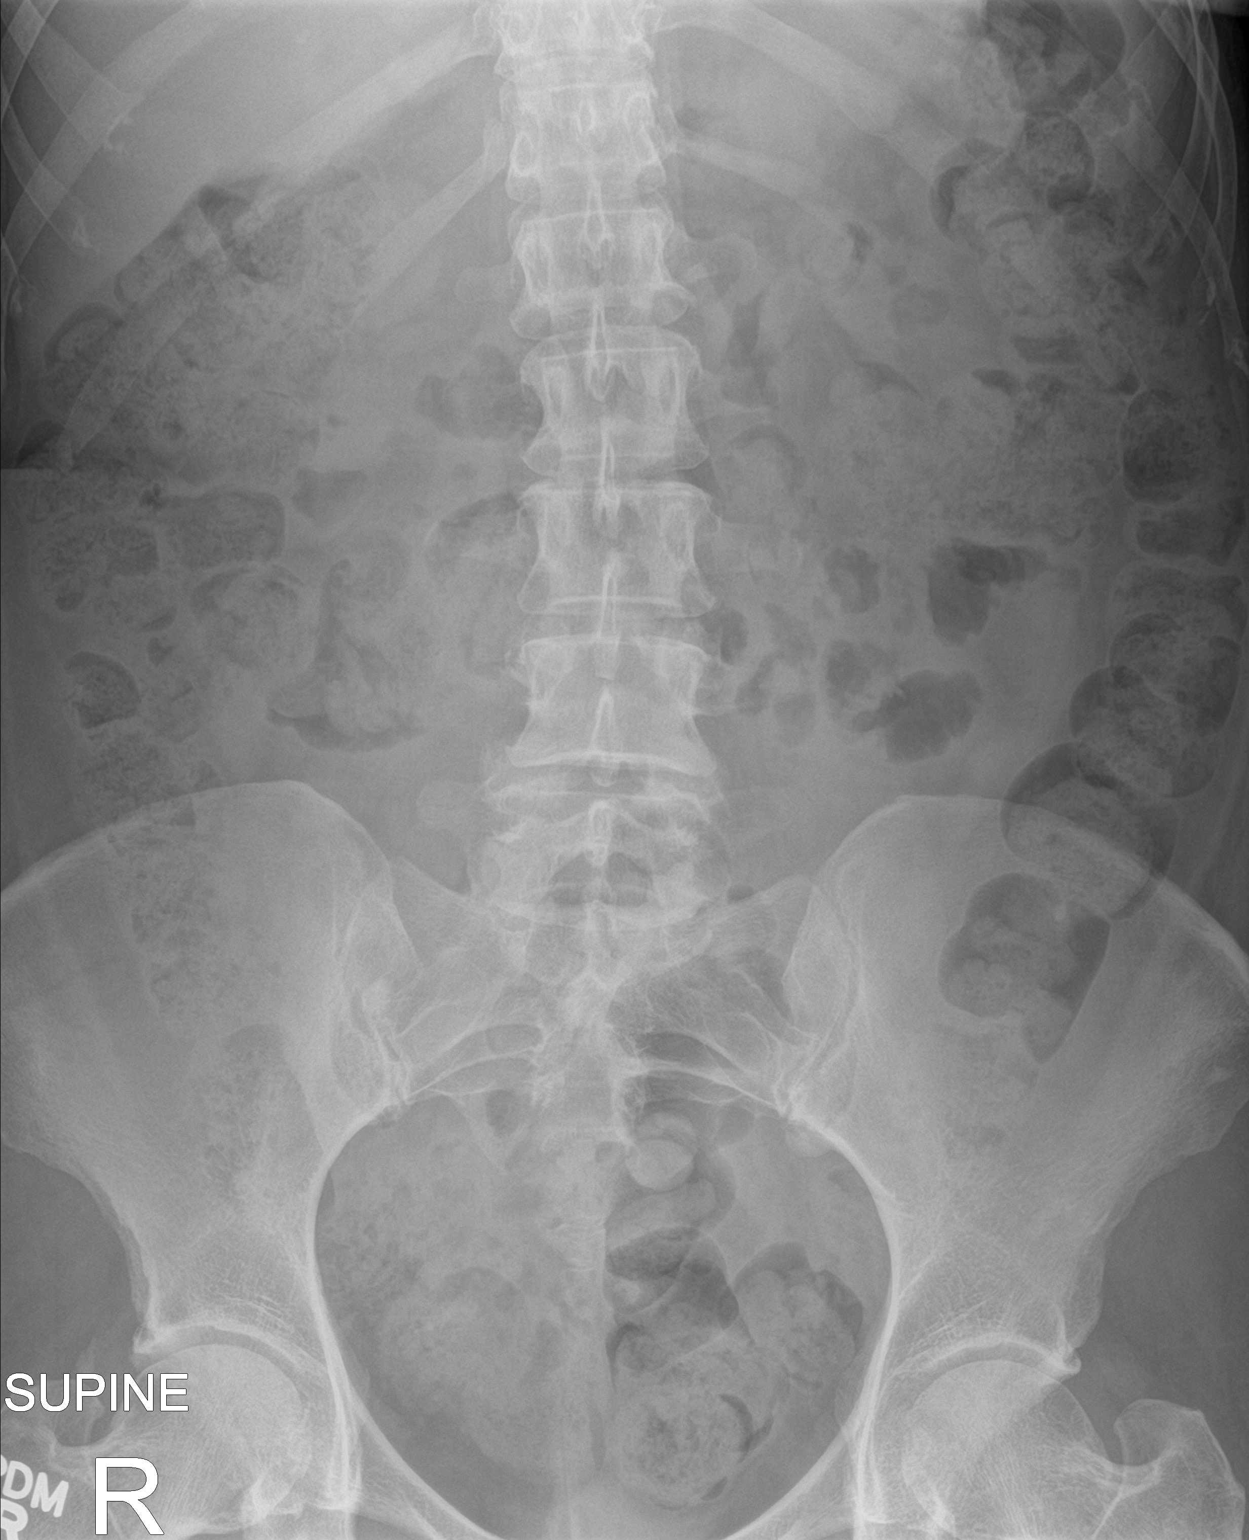

[3 of 3 positions shown; findings below may reference images not displayed]

FINDINGS: Large stool burden throughout the colon. The bowel gas pattern is
normal. There is no evidence of free intraperitoneal air. No
suspicious radio-opaque calculi or other significant radiographic
abnormality is seen. Heart size and mediastinal contours are within
normal limits. Both lungs are clear.
IMPRESSION: Large stool burden.  No acute findings.

## 2018-01-25 ENCOUNTER — Encounter: Payer: Self-pay | Admitting: Internal Medicine

## 2018-01-28 ENCOUNTER — Encounter: Payer: Self-pay | Admitting: Internal Medicine

## 2018-01-28 ENCOUNTER — Ambulatory Visit (INDEPENDENT_AMBULATORY_CARE_PROVIDER_SITE_OTHER): Payer: Commercial Managed Care - PPO | Admitting: Internal Medicine

## 2018-01-28 ENCOUNTER — Encounter

## 2018-01-28 ENCOUNTER — Other Ambulatory Visit (INDEPENDENT_AMBULATORY_CARE_PROVIDER_SITE_OTHER): Payer: Commercial Managed Care - PPO

## 2018-01-28 VITALS — BP 168/104 | HR 82 | Temp 99.0°F | Resp 16 | Ht 66.0 in | Wt 206.2 lb

## 2018-01-28 DIAGNOSIS — R4189 Other symptoms and signs involving cognitive functions and awareness: Secondary | ICD-10-CM

## 2018-01-28 DIAGNOSIS — M2550 Pain in unspecified joint: Secondary | ICD-10-CM

## 2018-01-28 DIAGNOSIS — I10 Essential (primary) hypertension: Secondary | ICD-10-CM | POA: Diagnosis not present

## 2018-01-28 DIAGNOSIS — M791 Myalgia, unspecified site: Secondary | ICD-10-CM | POA: Diagnosis not present

## 2018-01-28 DIAGNOSIS — E559 Vitamin D deficiency, unspecified: Secondary | ICD-10-CM

## 2018-01-28 DIAGNOSIS — Z23 Encounter for immunization: Secondary | ICD-10-CM

## 2018-01-28 DIAGNOSIS — T502X5A Adverse effect of carbonic-anhydrase inhibitors, benzothiadiazides and other diuretics, initial encounter: Secondary | ICD-10-CM

## 2018-01-28 DIAGNOSIS — E876 Hypokalemia: Secondary | ICD-10-CM

## 2018-01-28 LAB — CBC WITH DIFFERENTIAL/PLATELET
Basophils Absolute: 0.1 10*3/uL (ref 0.0–0.1)
Basophils Relative: 1 % (ref 0.0–3.0)
Eosinophils Absolute: 0.2 10*3/uL (ref 0.0–0.7)
Eosinophils Relative: 2.3 % (ref 0.0–5.0)
HCT: 44 % (ref 36.0–46.0)
Hemoglobin: 14.6 g/dL (ref 12.0–15.0)
Lymphocytes Relative: 28.1 % (ref 12.0–46.0)
Lymphs Abs: 2.2 10*3/uL (ref 0.7–4.0)
MCHC: 33.3 g/dL (ref 30.0–36.0)
MCV: 80.1 fl (ref 78.0–100.0)
Monocytes Absolute: 0.8 10*3/uL (ref 0.1–1.0)
Monocytes Relative: 10.8 % (ref 3.0–12.0)
Neutro Abs: 4.5 10*3/uL (ref 1.4–7.7)
Neutrophils Relative %: 57.8 % (ref 43.0–77.0)
Platelets: 302 10*3/uL (ref 150.0–400.0)
RBC: 5.49 Mil/uL — ABNORMAL HIGH (ref 3.87–5.11)
RDW: 14.7 % (ref 11.5–15.5)
WBC: 7.9 10*3/uL (ref 4.0–10.5)

## 2018-01-28 LAB — FOLATE: Folate: 23.5 ng/mL (ref >5.9)

## 2018-01-28 LAB — CK: Total CK: 78 U/L (ref 7–177)

## 2018-01-28 LAB — COMPREHENSIVE METABOLIC PANEL
ALT: 34 U/L (ref 0–35)
AST: 26 U/L (ref 0–37)
Albumin: 4.4 g/dL (ref 3.5–5.2)
Alkaline Phosphatase: 69 U/L (ref 39–117)
BUN: 16 mg/dL (ref 6–23)
CO2: 31 mEq/L (ref 19–32)
Calcium: 9.8 mg/dL (ref 8.4–10.5)
Chloride: 100 mEq/L (ref 96–112)
Creatinine, Ser: 0.75 mg/dL (ref 0.40–1.20)
GFR: 84.62 mL/min (ref >60.00)
Glucose, Bld: 139 mg/dL — ABNORMAL HIGH (ref 70–99)
Potassium: 3.1 mEq/L — ABNORMAL LOW (ref 3.5–5.1)
Sodium: 140 mEq/L (ref 135–145)
Total Bilirubin: 0.3 mg/dL (ref 0.2–1.2)
Total Protein: 7.6 g/dL (ref 6.0–8.3)

## 2018-01-28 LAB — VITAMIN D 25 HYDROXY (VIT D DEFICIENCY, FRACTURES): VITD: 21.53 ng/mL — ABNORMAL LOW (ref 30.00–100.00)

## 2018-01-28 LAB — VITAMIN B12: Vitamin B-12: 325 pg/mL (ref 211–911)

## 2018-01-28 LAB — C-REACTIVE PROTEIN: CRP: 0.3 mg/dL — ABNORMAL LOW (ref 0.5–20.0)

## 2018-01-28 LAB — SEDIMENTATION RATE: Sed Rate: 29 mm/hr (ref 0–30)

## 2018-01-28 MED ORDER — CHOLECALCIFEROL 1.25 MG (50000 UT) PO CAPS: 50000.0000 [IU] | ORAL_CAPSULE | ORAL | 1 refills | Status: DC

## 2018-01-28 MED ORDER — POTASSIUM CHLORIDE CRYS ER 20 MEQ PO TBCR: 20.0000 meq | EXTENDED_RELEASE_TABLET | Freq: Three times a day (TID) | ORAL | 3 refills | Status: DC

## 2018-01-28 MED ORDER — AZILSARTAN-CHLORTHALIDONE 40-25 MG PO TABS: 1.0000 | ORAL_TABLET | Freq: Every day | ORAL | 0 refills | Status: DC

## 2018-01-28 NOTE — Progress Notes (Signed)
Subjective:  Patient ID: [name] [name], female    DOB: 1960-06-16  Age: 57 y.o. MRN: [id number]  CC: Hypertension and Headache   HPI [name] [name] presents for multiple complaints.  1.  She complains that her blood pressure is not adequately well controlled.  She recently saw her gynecologist and was told that her blood pressure was 160/108.  She has had a few recent headaches but she denies blurred vision, CP, DOE, palpitations, edema, or dizziness.  [name]  2.  She also complains of chronic worsening memory.  She complains that she frequently drops things.  She was previously referred to neurology but never went.  She has chronic, intermittent episodes of tingling along her scalp, left side of her face, and her neck.  She also has intermittent tingling in her left upper extremity and left lower extremity.  She has a sister who has an MS and another sister who has chronic fatigue syndrome and fibromyalgia.  3.  She complains of a several month history of myalgias, arthralgias, and low-grade fever.  She denies rash, lymphadenopathy, sore throat, diarrhea, or weight loss.  She occasionally takes Aleve or ibuprofen for the pain.  Outpatient Medications Prior to Visit  Medication Sig Dispense Refill  . DULoxetine (CYMBALTA) 60 MG capsule TAKE 1 CAPSULE DAILY 90 capsule 1  . esomeprazole (NEXIUM) 20 MG packet Take 20 mg by mouth daily before breakfast. 90 each 1  . estradiol (ESTRACE) 0.5 MG tablet TK 1 T PO QD    . hydrochlorothiazide (MICROZIDE) 12.5 MG capsule Take 1 capsule (12.5 mg total) by mouth daily. 90 capsule 1  . metoprolol tartrate (LOPRESSOR) 25 MG tablet TAKE 1 TABLET(25 MG) BY MOUTH TWICE DAILY 180 tablet 1  . gabapentin (NEURONTIN) 100 MG capsule      No facility-administered medications prior to visit.     ROS Review of Systems  Constitutional: Positive for fatigue and unexpected weight change (wt gain). Negative for  appetite change.  HENT: Negative.   Eyes: Negative.   Respiratory: Negative for cough, chest tightness, shortness of breath and wheezing.   Cardiovascular: Negative for chest pain, palpitations and leg swelling.  Gastrointestinal: Negative for abdominal pain, constipation, diarrhea, nausea and vomiting.  Endocrine: Negative for cold intolerance and heat intolerance.  Genitourinary: Negative.  Negative for decreased urine volume, difficulty urinating, dysuria and urgency.  Musculoskeletal: Positive for arthralgias and myalgias. Negative for gait problem, neck pain and neck stiffness.  Skin: Negative.  Negative for color change and rash.  Neurological: Positive for weakness, numbness and headaches. Negative for dizziness, tremors, seizures, syncope, facial asymmetry, speech difficulty and light-headedness.  Hematological: Negative for adenopathy. Does not bruise/bleed easily.  Psychiatric/Behavioral: Negative.     Objective:  BP (!) 168/104 (BP Location: Left Arm, Patient Position: Sitting, Cuff Size: Large)   Pulse 82   Temp 99 F (37.2 C) (Oral)   Resp 16   Ht 5\' 6"  (1.676 m)   Wt 206 lb 4 oz (93.6 kg)   LMP  (LMP Unknown)   SpO2 96%   BMI 33.29 kg/m   BP Readings from Last 3 Encounters:  01/28/18 (!) 168/104  10/29/17 110/78  10/07/17 118/80    Wt Readings from Last 3 Encounters:  01/28/18 206 lb 4 oz (93.6 kg)  10/29/17 201 lb 12 oz (91.5 kg)  10/07/17 201 lb (91.2 kg)    Physical Exam  Constitutional: She is oriented to person, place, and  time. She appears well-developed and well-nourished.  Non-toxic appearance. She does not appear ill.  HENT:  Mouth/Throat: Oropharynx is clear and moist. No oropharyngeal exudate.  Eyes: Conjunctivae are normal. No scleral icterus.  Neck: Normal range of motion. Neck supple. No JVD present. No neck rigidity. No thyromegaly present.  Cardiovascular: Normal rate, regular rhythm and normal heart sounds.  No murmur  heard. Pulmonary/Chest: Effort normal and breath sounds normal. She has no wheezes. She has no rales.  Abdominal: Soft. Normal appearance and bowel sounds are normal. She exhibits no mass. There is no hepatosplenomegaly. There is no tenderness. There is no tenderness at McBurney's point.  Musculoskeletal: Normal range of motion. She exhibits no edema, tenderness or deformity.  Lymphadenopathy:    She has no cervical adenopathy.  Neurological: She is alert and oriented to person, place, and time.  Skin: Skin is warm and dry. She is not diaphoretic. No pallor.  Psychiatric: She has a normal mood and affect. Her behavior is normal. Judgment and thought content normal.  Vitals reviewed.   Lab Results  Component Value Date   WBC 7.9 01/28/2018   HGB 14.6 01/28/2018   HCT 44.0 01/28/2018   PLT 302.0 01/28/2018   GLUCOSE 139 (H) 01/28/2018   CHOL 179 10/29/2017   TRIG 143.0 10/29/2017   HDL 43.00 10/29/2017   LDLCALC 107 (H) 10/29/2017   ALT 34 01/28/2018   AST 26 01/28/2018   NA 140 01/28/2018   K 3.1 (L) 01/28/2018   CL 100 01/28/2018   CREATININE 0.75 01/28/2018   BUN 16 01/28/2018   CO2 31 01/28/2018   TSH 2.16 10/29/2017   HGBA1C 5.3 08/27/2015    No results found.  Assessment & Plan:   [name] was seen today for hypertension and headache.  Diagnoses and all orders for this visit:  Essential hypertension- Her blood pressure is not adequately well controlled.  I will treat the vitamin D deficiency and hypokalemia.  Otherwise her labs are negative for secondary causes or endorgan damage. -     CBC with Differential/Platelet; Future -     Comprehensive metabolic panel; Future -     VITAMIN D 25 Hydroxy (Vit-D Deficiency, Fractures); Future -     Azilsartan-Chlorthalidone (EDARBYCLOR) 40-25 MG TABS; Take 1 tablet by mouth daily.  Arthralgia, unspecified joint- Screening for inflammatory arthritis is negative.  She have 3+ antibodies for Lyme disease so I have asked her to see  ID to decipher whether or not this is significant. -     Sedimentation rate; Future -     B. burgdorfi antibodies by WB; Future -     ANA; Future -     C-reactive protein; Future -     Rheumatoid factor; Future -     Cyclic citrul peptide antibody, IgG; Future  Myalgia- Her labs are negative for myopathy or myositis.  See above. -     CK; Future -     Sedimentation rate; Future -     B. burgdorfi antibodies by WB; Future -     ANA; Future -     C-reactive protein; Future -     Rheumatoid factor; Future -     Cyclic citrul peptide antibody, IgG; Future  Cognitive decline- Her labs are negative for secondary causes.  I have referred her to neurology again. -     Vitamin B12; Future -     Folate; Future -     Ambulatory referral to Neurology  Need for influenza  vaccination -     Flu Vaccine QUAD 36+ mos IM  Diuretic-induced hypokalemia -     potassium chloride SA (K-DUR,KLOR-CON) 20 MEQ tablet; Take 1 tablet (20 mEq total) by mouth 3 (three) times daily.  Vitamin D deficiency disease -     Cholecalciferol 1.25 MG (50000 UT) capsule; Take 1 capsule (50,000 Units total) by mouth once a week.   I have discontinued [name] [name]'s gabapentin. I am also having her start on Azilsartan-Chlorthalidone, potassium chloride SA, and Cholecalciferol. Additionally, I am having her maintain her estradiol, metoprolol tartrate, esomeprazole, hydrochlorothiazide, and DULoxetine.  Meds ordered this encounter  Medications  . Azilsartan-Chlorthalidone (EDARBYCLOR) 40-25 MG TABS    Sig: Take 1 tablet by mouth daily.    Dispense:  42 tablet    Refill:  0  . potassium chloride SA (K-DUR,KLOR-CON) 20 MEQ tablet    Sig: Take 1 tablet (20 mEq total) by mouth 3 (three) times daily.    Dispense:  90 tablet    Refill:  3  . Cholecalciferol 1.25 MG (50000 UT) capsule    Sig: Take 1 capsule (50,000 Units total) by mouth once a week.    Dispense:  12 capsule    Refill:  1     Follow-up: Return in  about 4 weeks (around 02/25/2018).  Scarlette Calico, MD

## 2018-01-28 NOTE — Patient Instructions (Signed)

## 2018-01-30 LAB — B. BURGDORFI ANTIBODIES BY WB
B burgdorferi IgG Abs (IB): NEGATIVE
B burgdorferi IgM Abs (IB): NEGATIVE
Lyme Disease 18 kD IgG: REACTIVE — AB
Lyme Disease 23 kD IgG: NONREACTIVE
Lyme Disease 23 kD IgM: NONREACTIVE
Lyme Disease 28 kD IgG: REACTIVE — AB
Lyme Disease 30 kD IgG: NONREACTIVE
Lyme Disease 39 kD IgG: NONREACTIVE
Lyme Disease 39 kD IgM: NONREACTIVE
Lyme Disease 41 kD IgG: REACTIVE — AB
Lyme Disease 41 kD IgM: NONREACTIVE
Lyme Disease 45 kD IgG: NONREACTIVE
Lyme Disease 58 kD IgG: NONREACTIVE
Lyme Disease 66 kD IgG: NONREACTIVE
Lyme Disease 93 kD IgG: NONREACTIVE

## 2018-01-30 LAB — CYCLIC CITRUL PEPTIDE ANTIBODY, IGG: Cyclic Citrullin Peptide Ab: <16 UNITS

## 2018-01-30 LAB — ANA: Anti Nuclear Antibody(ANA): NEGATIVE

## 2018-01-30 LAB — RHEUMATOID FACTOR: Rhuematoid fact SerPl-aCnc: <14 IU/mL (ref <14)

## 2018-02-01 ENCOUNTER — Encounter: Payer: Self-pay | Admitting: Internal Medicine

## 2018-02-01 ENCOUNTER — Other Ambulatory Visit: Payer: Self-pay | Admitting: Internal Medicine

## 2018-02-01 DIAGNOSIS — R768 Other specified abnormal immunological findings in serum: Secondary | ICD-10-CM | POA: Insufficient documentation

## 2018-02-03 ENCOUNTER — Encounter: Payer: Self-pay | Admitting: Internal Medicine

## 2018-02-17 ENCOUNTER — Encounter: Payer: Self-pay | Admitting: Internal Medicine

## 2018-02-17 ENCOUNTER — Ambulatory Visit (INDEPENDENT_AMBULATORY_CARE_PROVIDER_SITE_OTHER): Payer: Commercial Managed Care - PPO | Admitting: Internal Medicine

## 2018-02-17 VITALS — BP 108/71 | HR 98 | Temp 97.3°F | Ht 66.0 in | Wt 206.0 lb

## 2018-02-17 DIAGNOSIS — R5382 Chronic fatigue, unspecified: Secondary | ICD-10-CM | POA: Diagnosis not present

## 2018-02-17 NOTE — Progress Notes (Signed)
[facility]  Reason for Consult: Evaluation of Lyme serology results Referring Provider: Dr. Scarlette Calico  Assessment: I suspect that her recent, protracted decline is multifactorial but I am not concerned about chronic infection.  Specifically she does not have any clinical evidence of Lyme disease and the official interpretation of her serology is that her IgG and IgM levels were negative.  In order to be considered positive, a patient must have 5 or more IgG bands positive and she only had 3.  This is completely insignificant and does not require any further evaluation or follow-up.  She is scheduled for neurologic evaluation in January.  I have encouraged her to shift some of her focus to symptom management rather than just searching for a diagnosis.  I strongly suspect that some of her symptoms can be explained by stress, depression, poor sleep, weight gain and deconditioning.  I talked to her about what used to be known as chronic fatigue syndrome and gave her written information about this to consider.  Plan: 1. No further evaluation or treatment indicated for possible infection 2. Recommend focusing on symptom management 3. Await neurologic evaluation 4. I would be happy to see her back as needed.  Patient Active Problem List   Diagnosis Date Noted  . Arthralgia 01/28/2018    Priority: High  . Myalgia 01/28/2018    Priority: High  . Cognitive decline 01/28/2018    Priority: High  . Vitamin D deficiency disease 01/28/2018  . GERD with esophagitis 10/29/2017  . Diuretic-induced hypokalemia 07/03/2015  . Constipation 07/03/2015  . Overweight(278.02)   . Routine health maintenance 12/26/2010  . DEPRESSION/ANXIETY 04/28/2007  . MIGRAINE HEADACHE 04/28/2007  . Essential hypertension 04/28/2007  . IRRITABLE BOWEL SYNDROME 04/28/2007  . SPONDYLOSIS, CERVICAL, WITH RADICULOPATHY 04/28/2007    Patient's Medications  New Prescriptions   No  medications on file  Previous Medications   AZILSARTAN-CHLORTHALIDONE (EDARBYCLOR) 40-25 MG TABS    Take 1 tablet by mouth daily.   CHOLECALCIFEROL 1.25 MG (50000 UT) CAPSULE    Take 1 capsule (50,000 Units total) by mouth once a week.   DULOXETINE (CYMBALTA) 60 MG CAPSULE    TAKE 1 CAPSULE DAILY   ESOMEPRAZOLE (NEXIUM) 20 MG PACKET    Take 20 mg by mouth daily before breakfast.   ESTRADIOL (ESTRACE) 0.5 MG TABLET    TK 1 T PO QD   METOPROLOL TARTRATE (LOPRESSOR) 25 MG TABLET    TAKE 1 TABLET(25 MG) BY MOUTH TWICE DAILY   POTASSIUM CHLORIDE SA (K-DUR,KLOR-CON) 20 MEQ TABLET    Take 1 tablet (20 mEq total) by mouth 3 (three) times daily.  Modified Medications   No medications on file  Discontinued Medications   HYDROCHLOROTHIAZIDE (MICROZIDE) 12.5 MG CAPSULE    Take 1 capsule (12.5 mg total) by mouth daily.    HPI: [name] [name] is a 57 y.o. female who was referred to me for evaluation of possible Lyme disease.  She recently saw her PCP, Dr. Ronnald Ramp, and mentioned that she was having low-grade fevers, myalgias and arthralgias.  She had blood work done including Lyme serology which showed negative IgM antibodies but 3 out of 10 positive IgG antibodies.  Her sedimentation rate was normal at 29.  Her C-reactive protein was below normal at 0.3.  ANA, rheumatoid factor and CCP antibodies were negative.  She had no anemia, thrombocytopenia or liver enzyme elevation.  She tells me that her problems have been  going on for the past 1-1/2 to 2 years.  In addition to the arthralgias and myalgias she has also noted intense tingling on the left side of her head, face and body associated with some intermittent weakness.  She has had some cognitive decline that she is concerned about.  She has a long history of anxiety and some depression.  She has been under a great deal of stress with work and her husband's health decline.  She is his primary caregiver.  She has problems with nocturia even though she has had a  bladder tack procedure.  Over the past few years she has had increasing problems with nocturia and wakes frequently.  She does not have any trouble falling asleep but when she wakes in the morning she does not feel rested.  She has been extremely fatigued during the day causing her to give up many activities.  She says that all weekend she needs one full day simply to rest.  She has given up much of her yard work and struggles with housekeeping.  She has gained weight over the past few years.  She works as a Radiation protection practitioner for [employer].  She has been responsible for handling some major accounts.  She feels confident that her email contact and phone calls with her customers are going fairly well but she feels like she struggles in face-to-face discussions.  Review of Systems: Review of Systems  Constitutional: Positive for malaise/fatigue. Negative for chills, diaphoresis, fever and weight loss.       She has had what she describes as low-grade fevers but has never had any documented fever since this illness began.  HENT: Negative for congestion and sore throat.   Respiratory: Positive for shortness of breath. Negative for cough and sputum production.   Cardiovascular: Negative for chest pain.  Gastrointestinal: Negative for abdominal pain, diarrhea, nausea and vomiting.  Genitourinary: Negative for dysuria.       Urinary frequency and nocturia.  Musculoskeletal: Positive for joint pain and myalgias.  Skin: Negative for rash.  Neurological: Positive for dizziness, tingling, sensory change and focal weakness.       She describes intermittent tingling on the left side of her head face and arm.  She also feels like she has some intermittent left-sided weakness.  She has noted some cognitive decline over the past 2 years.  She feels like her words often come out jumbled.  She feels like she knows what she wants to say but it often comes out differently.  She believes that  coworkers have noticed this leading to certain responsibilities being taken away from her.  Psychiatric/Behavioral: Positive for depression. The patient is nervous/anxious and has insomnia.       Past Medical History:  Diagnosis Date  . Anxiety   . Depression   . Dysrhythmia    bigeminy  . Elevated liver enzymes 2017   undiagnosed and levels recently were more normal per pt  . Hypertension   . IBS (irritable bowel syndrome)   . Migraine, unspecified, without mention of intractable migraine without mention of status migrainosus   . PONV (postoperative nausea and vomiting)   . Tachycardia, unspecified    on BBloc    Social History   Tobacco Use  . Smoking status: Never Smoker  . Smokeless tobacco: Never Used  Substance Use Topics  . Alcohol use: Yes    Comment:  Typically 1 wine or beer per month  . Drug use: No  Family History  Problem Relation Age of Onset  . Cancer Mother        breast cancer-left mastectomy  . Asthma Mother   . Alzheimer's disease Mother   . Transient ischemic attack Mother   . Hypertension Father   . Hyperlipidemia Father   . Diabetes Father   . Heart attack Father 74       CABG  . Heart failure Father   . Pancreatitis Father   . Alzheimer's disease Maternal Grandmother    Allergies  Allergen Reactions  . Enalapril Cough  . Codeine Nausea And Vomiting    REACTION: causes nausea and vomiting  . Macrobid [Nitrofurantoin] Other (See Comments)    Gets elevated liver enzymes, low potassium, headache, nausea and generalized body aches shortly after taking meds.  This is the second time this had occurred.    OBJECTIVE: Vitals:   02/17/18 1501  BP: 108/71  Pulse: 98  Temp: (!) 97.3 F (36.3 C)  TempSrc: Oral  Weight: 206 lb (93.4 kg)  Height: 5\' 6"  (1.676 m)   Body mass index is 33.25 kg/m.   Physical Exam  Constitutional: She is oriented to person, place, and time.  She appears slightly nervous and worried but she is in no  acute distress.  She told me that she had gained about 35 pounds over the past few years although records indicate that is actually only been about 10 to 12 pounds.  HENT:  Mouth/Throat: No oropharyngeal exudate.  Eyes: Conjunctivae are normal.  Neck: Neck supple.  Cardiovascular: Normal rate, regular rhythm and normal heart sounds.  No murmur heard. Pulmonary/Chest: Effort normal and breath sounds normal.  Abdominal: Soft. She exhibits no mass. There is no tenderness.  Musculoskeletal: Normal range of motion. She exhibits no edema or tenderness.  Lymphadenopathy:    She has no cervical adenopathy.    She has no axillary adenopathy.  Neurological: She is alert and oriented to person, place, and time.  Skin: No rash noted.  Psychiatric:  She became tearful when talking about the stress she is under, especially when thinking about the told that her husband's health issues have taken on her.    Microbiology: No results found for this or any previous visit (from the past 240 hour(s)).  Michel Bickers, MD [facility] [phone] pager   [phone] cell 02/17/2018, 5:01 PM

## 2018-02-26 ENCOUNTER — Other Ambulatory Visit (INDEPENDENT_AMBULATORY_CARE_PROVIDER_SITE_OTHER): Payer: Commercial Managed Care - PPO

## 2018-02-26 ENCOUNTER — Encounter: Payer: Self-pay | Admitting: Internal Medicine

## 2018-02-26 ENCOUNTER — Ambulatory Visit (INDEPENDENT_AMBULATORY_CARE_PROVIDER_SITE_OTHER): Payer: Commercial Managed Care - PPO | Admitting: Internal Medicine

## 2018-02-26 VITALS — BP 132/76 | HR 86 | Temp 98.6°F | Resp 16 | Ht 66.0 in | Wt 208.0 lb

## 2018-02-26 DIAGNOSIS — T502X5A Adverse effect of carbonic-anhydrase inhibitors, benzothiadiazides and other diuretics, initial encounter: Secondary | ICD-10-CM

## 2018-02-26 DIAGNOSIS — F330 Major depressive disorder, recurrent, mild: Secondary | ICD-10-CM | POA: Insufficient documentation

## 2018-02-26 DIAGNOSIS — I10 Essential (primary) hypertension: Secondary | ICD-10-CM

## 2018-02-26 DIAGNOSIS — E876 Hypokalemia: Secondary | ICD-10-CM

## 2018-02-26 LAB — BASIC METABOLIC PANEL
BUN: 19 mg/dL (ref 6–23)
CO2: 34 mEq/L — ABNORMAL HIGH (ref 19–32)
Calcium: 10 mg/dL (ref 8.4–10.5)
Chloride: 97 mEq/L (ref 96–112)
Creatinine, Ser: 0.89 mg/dL (ref 0.40–1.20)
GFR: 69.44 mL/min (ref >60.00)
Glucose, Bld: 112 mg/dL — ABNORMAL HIGH (ref 70–99)
Potassium: 3.6 mEq/L (ref 3.5–5.1)
Sodium: 137 mEq/L (ref 135–145)

## 2018-02-26 LAB — MAGNESIUM: Magnesium: 1.9 mg/dL (ref 1.5–2.5)

## 2018-02-26 MED ORDER — BUPROPION HCL ER (XL) 150 MG PO TB24: 150.0000 mg | ORAL_TABLET | Freq: Every day | ORAL | 1 refills | Status: DC

## 2018-02-26 NOTE — Progress Notes (Signed)
Subjective:  Patient ID: [name] [name], female    DOB: Jul 25, 1960  Age: 57 y.o. MRN: [id number]  CC: Hypertension   HPI [name] [name] presents for a BP check - She tells me [name] [name] blood pressure has been well controlled and she is tolerating the antihypertensives well.  She complains of worsening depression with anhedonia, sadness, crying spells, weight gain, sleeping too much, and napping on the weekends.  She wants to continue taking duloxetine but is willing to add something to it.  Outpatient Medications Prior to Visit  Medication Sig Dispense Refill  . Azilsartan-Chlorthalidone (EDARBYCLOR) 40-25 MG TABS Take 1 tablet by mouth daily. 42 tablet 0  . Cholecalciferol 1.25 MG (50000 UT) capsule Take 1 capsule (50,000 Units total) by mouth once a week. 12 capsule 1  . DULoxetine (CYMBALTA) 60 MG capsule TAKE 1 CAPSULE DAILY 90 capsule 1  . esomeprazole (NEXIUM) 20 MG packet Take 20 mg by mouth daily before breakfast. 90 each 1  . estradiol (ESTRACE) 0.5 MG tablet TK 1 T PO QD    . metoprolol tartrate (LOPRESSOR) 25 MG tablet TAKE 1 TABLET(25 MG) BY MOUTH TWICE DAILY 180 tablet 1  . potassium chloride SA (K-DUR,KLOR-CON) 20 MEQ tablet Take 1 tablet (20 mEq total) by mouth 3 (three) times daily. 90 tablet 3   No facility-administered medications prior to visit.     ROS Review of Systems  Constitutional: Negative for diaphoresis and fatigue.  HENT: Negative.   Eyes: Negative for visual disturbance.  Respiratory: Negative for cough, chest tightness, shortness of breath and wheezing.   Cardiovascular: Negative for chest pain, palpitations and leg swelling.  Gastrointestinal: Negative for abdominal pain, constipation, diarrhea, nausea and vomiting.  Genitourinary: Negative.  Negative for difficulty urinating.  Musculoskeletal: Negative.  Negative for arthralgias and myalgias.  Skin: Negative.  Negative for color change.  Neurological: Negative.  Negative for dizziness, weakness,  light-headedness and headaches.  Hematological: Negative for adenopathy. Does not bruise/bleed easily.  Psychiatric/Behavioral: Positive for dysphoric mood and sleep disturbance. Negative for agitation, behavioral problems, confusion, decreased concentration, hallucinations and self-injury. The patient is not nervous/anxious and is not hyperactive.     Objective:  BP 132/76 (BP Location: Left Arm, Patient Position: Sitting, Cuff Size: Normal)   Pulse 86   Temp 98.6 F (37 C) (Oral)   Resp 16   Ht 5\' 6"  (1.676 m)   Wt 208 lb (94.3 kg)   LMP  (LMP Unknown)   SpO2 98%   BMI 33.57 kg/m   BP Readings from Last 3 Encounters:  02/26/18 132/76  02/17/18 108/71  01/28/18 (!) 168/104    Wt Readings from Last 3 Encounters:  02/26/18 208 lb (94.3 kg)  02/17/18 206 lb (93.4 kg)  01/28/18 206 lb 4 oz (93.6 kg)    Physical Exam Vitals signs reviewed.  Constitutional:      Appearance: Normal appearance.  HENT:     Mouth/Throat:     Mouth: Mucous membranes are moist.     Pharynx: No posterior oropharyngeal erythema.  Neck:     Musculoskeletal: Normal range of motion and neck supple.  Cardiovascular:     Rate and Rhythm: Normal rate and regular rhythm.     Pulses: Normal pulses.     Heart sounds: No murmur. No gallop.   Pulmonary:     Effort: Pulmonary effort is normal.     Breath sounds: No stridor. No wheezing, rhonchi or rales.  Abdominal:     General: Abdomen is  flat. Bowel sounds are normal. There is no distension.     Palpations: Abdomen is soft. There is no mass.     Tenderness: There is no abdominal tenderness.  Musculoskeletal: Normal range of motion.  Skin:    General: Skin is warm and dry.  Neurological:     General: No focal deficit present.     Mental Status: She is alert and oriented to person, place, and time.  Psychiatric:        Attention and Perception: She is attentive. She does not perceive auditory or visual hallucinations.        Mood and Affect: Mood is  anxious and depressed. Affect is not labile or angry.        Speech: Speech normal.        Behavior: Behavior normal. Behavior is cooperative.        Thought Content: Thought content normal.        Cognition and Memory: Cognition and memory normal.     Comments: She is tearful     Lab Results  Component Value Date   WBC 7.9 01/28/2018   HGB 14.6 01/28/2018   HCT 44.0 01/28/2018   PLT 302.0 01/28/2018   GLUCOSE 112 (H) 02/26/2018   CHOL 179 10/29/2017   TRIG 143.0 10/29/2017   HDL 43.00 10/29/2017   LDLCALC 107 (H) 10/29/2017   ALT 34 01/28/2018   AST 26 01/28/2018   NA 137 02/26/2018   K 3.6 02/26/2018   CL 97 02/26/2018   CREATININE 0.89 02/26/2018   BUN 19 02/26/2018   CO2 34 (H) 02/26/2018   TSH 2.16 10/29/2017   HGBA1C 5.3 08/27/2015    No results found.  Assessment & Plan:   [name] [name] was seen today for hypertension.  Diagnoses and all orders for this visit:  Diuretic-induced hypokalemia- [name] [name] potassium level is normal now.  She will continue the current potassium supplement. -     Basic metabolic panel; Future -     Magnesium; Future  Essential hypertension- [name] [name] blood pressure is well controlled.  Electrolytes and renal function are normal. -     Basic metabolic panel; Future -     Magnesium; Future  Mild episode of recurrent major depressive disorder ([place])- I recommended that she add bupropion to the duloxetine.  We may consider increasing the bupropion dose over the next month or 2. -     buPROPion (WELLBUTRIN XL) 150 MG 24 hr tablet; Take 1 tablet (150 mg total) by mouth daily.   I am having [name] [name] start on buPROPion. I am also having [name] [name] maintain [name] [name] estradiol, metoprolol tartrate, esomeprazole, DULoxetine, Azilsartan-Chlorthalidone, potassium chloride SA, and Cholecalciferol.  Meds ordered this encounter  Medications  . buPROPion (WELLBUTRIN XL) 150 MG 24 hr tablet    Sig: Take 1 tablet (150 mg total) by mouth daily.    Dispense:  30 tablet     Refill:  1     Follow-up: No follow-ups on file.  Scarlette Calico, MD

## 2018-02-27 ENCOUNTER — Encounter: Payer: Self-pay | Admitting: Internal Medicine

## 2018-02-27 NOTE — Patient Instructions (Signed)
Major Depressive Disorder, Adult Major depressive disorder (MDD) is a mental health condition. It may also be called clinical depression or unipolar depression. MDD usually causes feelings of sadness, hopelessness, or helplessness. MDD can also cause physical symptoms. It can interfere with work, school, relationships, and other everyday activities. MDD may be mild, moderate, or severe. It may occur once (single episode major depressive disorder) or it may occur multiple times (recurrent major depressive disorder). What are the causes? The exact cause of this condition is not known. MDD is most likely caused by a combination of things, which may include:  Genetic factors. These are traits that are passed along from parent to child.  Individual factors. Your personality, your behavior, and the way you handle your thoughts and feelings may contribute to MDD. This includes personality traits and behaviors learned from others.  Physical factors, such as: ? Differences in the part of your brain that controls emotion. This part of your brain may be different than it is in people who do not have MDD. ? Long-term (chronic) medical or psychiatric illnesses.  Social factors. Traumatic experiences or major life changes may play a role in the development of MDD.  What increases the risk? This condition is more likely to develop in women. The following factors may also make you more likely to develop MDD:  A family history of depression.  Troubled family relationships.  Abnormally low levels of certain brain chemicals.  Traumatic events in childhood, especially abuse or the loss of a parent.  Being under a lot of stress, or long-term stress, especially from upsetting life experiences or losses.  A history of: ? Chronic physical illness. ? Other mental health disorders. ? Substance abuse.  Poor living conditions.  Experiencing social exclusion or discrimination on a regular basis.  What are  the signs or symptoms? The main symptoms of MDD typically include:  Constant depressed or irritable mood.  Loss of interest in things and activities.  MDD symptoms may also include:  Sleeping or eating too much or too little.  Unexplained weight change.  Fatigue or low energy.  Feelings of worthlessness or guilt.  Difficulty thinking clearly or making decisions.  Thoughts of suicide or of harming others.  Physical agitation or weakness.  Isolation.  Severe cases of MDD may also occur with other symptoms, such as:  Delusions or hallucinations, in which you imagine things that are not real (psychotic depression).  Low-level depression that lasts at least a year (chronic depression or persistent depressive disorder).  Extreme sadness and hopelessness (melancholic depression).  Trouble speaking and moving (catatonic depression).  How is this diagnosed? This condition may be diagnosed based on:  Your symptoms.  Your medical history, including your mental health history. This may involve tests to evaluate your mental health. You may be asked questions about your lifestyle, including any drug and alcohol use, and how long you have had symptoms of MDD.  A physical exam.  Blood tests to rule out other conditions.  You must have a depressed mood and at least four other MDD symptoms most of the day, nearly every day in the same 2-week timeframe before your health care provider can confirm a diagnosis of MDD. How is this treated? This condition is usually treated by mental health professionals, such as psychologists, psychiatrists, and clinical social workers. You may need more than one type of treatment. Treatment may include:  Psychotherapy. This is also called talk therapy or counseling. Types of psychotherapy include: ? Cognitive behavioral   therapy (CBT). This type of therapy teaches you to recognize unhealthy feelings, thoughts, and behaviors, and replace them with  positive thoughts and actions. ? Interpersonal therapy (IPT). This helps you to improve the way you relate to and communicate with others. ? Family therapy. This treatment includes members of your family.  Medicine to treat anxiety and depression, or to help you control certain emotions and behaviors.  Lifestyle changes, such as: ? Limiting alcohol and drug use. ? Exercising regularly. ? Getting plenty of sleep. ? Making healthy eating choices. ? Spending more time outdoors.  Treatments involving stimulation of the brain can be used in situations with extremely severe symptoms, or when medicine or other therapies do not work over time. These treatments include electroconvulsive therapy, transcranial magnetic stimulation, and vagal nerve stimulation. Follow these instructions at home: Activity  Return to your normal activities as told by your health care provider.  Exercise regularly and spend time outdoors as told by your health care provider. General instructions  Take over-the-counter and prescription medicines only as told by your health care provider.  Do not drink alcohol. If you drink alcohol, limit your alcohol intake to no more than 1 drink a day for nonpregnant women and 2 drinks a day for men. One drink equals 12 oz of beer, 5 oz of wine, or 1 oz of hard liquor. Alcohol can affect any antidepressant medicines you are taking. Talk to your health care provider about your alcohol use.  Eat a healthy diet and get plenty of sleep.  Find activities that you enjoy doing, and make time to do them.  Consider joining a support group. Your health care provider may be able to recommend a support group.  Keep all follow-up visits as told by your health care provider. This is important. Where to find more information: National Alliance on Mental Illness  www.nami.org  U.S. National Institute of Mental Health  www.nimh.nih.gov  National Suicide Prevention  Lifeline  1-800-273-TALK (8255). This is free, 24-hour help.  Contact a health care provider if:  Your symptoms get worse.  You develop new symptoms. Get help right away if:  You self-harm.  You have serious thoughts about hurting yourself or others.  You see, hear, taste, smell, or feel things that are not present (hallucinate). This information is not intended to replace advice given to you by your health care provider. Make sure you discuss any questions you have with your health care provider. Document Released: 06/29/2012 Document Revised: 11/09/2015 Document Reviewed: 09/13/2015 Elsevier Interactive Patient Education  2018 Elsevier Inc.  

## 2018-02-28 ENCOUNTER — Encounter: Payer: Self-pay | Admitting: Internal Medicine

## 2018-03-03 ENCOUNTER — Telehealth: Payer: Self-pay | Admitting: Internal Medicine

## 2018-03-03 DIAGNOSIS — I10 Essential (primary) hypertension: Secondary | ICD-10-CM

## 2018-03-03 MED ORDER — AZILSARTAN-CHLORTHALIDONE 40-25 MG PO TABS: 1.0000 | ORAL_TABLET | Freq: Every day | ORAL | 1 refills | Status: DC

## 2018-03-03 NOTE — Telephone Encounter (Signed)
Copied from [place] [id number]. Topic: Quick Communication - Rx Refill/Question >> Mar 03, 2018  3:32 PM Sheran Luz wrote: Medication: Azilsartan-Chlorthalidone (EDARBYCLOR) 40-25 MG TABS  Patient is requesting refill of this medication.    Has the patient contacted their pharmacy? Patient states she was advised to contact office.   Preferred Pharmacy (with phone number or street name): [id number] - [place], [address] [place] [phone] (Phone) [phone] (Fax)

## 2018-03-09 NOTE — Telephone Encounter (Signed)
Pt called stating that insurance requires PA for Azilsartan-Chlorthalidone (EDARBYCLOR) 40-25 MG TABS. Without PA it is $700. Pt asking if PA can be approved before Wednesday as she is going out of town until 03/17/18. She will be out of the samples on Wednesday also. She is asking if medication can be changed or additional samples provided until PA is approved.  Call back # [phone] x [place], [place]

## 2018-03-09 NOTE — Telephone Encounter (Signed)
PA has been approved.   Samples up front for pt.

## 2018-03-09 NOTE — Telephone Encounter (Signed)
Key: [id number]

## 2018-03-09 NOTE — Telephone Encounter (Signed)
Patient is calling to check on the status of this she would like a call back. [phone] ext [phone]

## 2018-03-27 ENCOUNTER — Other Ambulatory Visit: Payer: Self-pay | Admitting: Internal Medicine

## 2018-03-27 DIAGNOSIS — I10 Essential (primary) hypertension: Secondary | ICD-10-CM

## 2018-03-28 ENCOUNTER — Other Ambulatory Visit: Payer: Self-pay | Admitting: Internal Medicine

## 2018-04-07 ENCOUNTER — Encounter: Payer: Self-pay | Admitting: Neurology

## 2018-04-07 ENCOUNTER — Ambulatory Visit (INDEPENDENT_AMBULATORY_CARE_PROVIDER_SITE_OTHER): Payer: Commercial Managed Care - PPO | Admitting: Neurology

## 2018-04-07 VITALS — BP 112/62 | HR 72 | Ht 66.0 in | Wt 208.5 lb

## 2018-04-07 DIAGNOSIS — R413 Other amnesia: Secondary | ICD-10-CM

## 2018-04-07 NOTE — Progress Notes (Signed)
PATIENT: [name] [name] DOB: Jun 11, 1960  Chief Complaint  Patient presents with  . Cognitive Decline    MMSE - animals. She is here with for further evaluation for memory loss, speech issues, (words coming out backwards), word finding difficulty and repeating herself.  She has noticed worsening of fatigue.  Marland Kitchen PCP    Janith Lima, MD     HISTORICAL  [name] [name] is a 58 year old female, seen in request by her primary care physician Dr. Scarlette Calico for evaluation of memory loss, initial evaluation was on April 07, 2018.  I have reviewed and summarized the referring note from the referring physician.  She had a past medical history of HTN, depression, was recently treated with Wellbutrin XR 150 mg daily, has been on Cymbalta 60 mg for long time.  She had 12 years of education, used to work as an Glass blower/designer, currently works as a high end Administrator, Civil [employer], she also complains of stress, she is the caregiver of her husband who had a multiple lumbar and cervical decompression surgery in the past.  She used to work as a Journalist, newspaper, dealing with customer account such as [employer] and [place], since 2019, she noticed intermittent difficulty, she has word finding difficulties, difficulty focusing on her job, she feels like her coworker do not look her as same,  She also felt intermittent weakness on the left side of her body,  She did have strong family history of dementia, her mother, maternal grandmother all suffer significant memory loss.  Laboratory evaluation in December 2019 showed BMP with mild elevated glucose 112, vitamin D level was 21, negative rheumatoid factor, ANA, ESR, folic acid, CMP showed mildly low potassium 3.1, normal CPK, B12,  REVIEW OF SYSTEMS: Full 14 system review of systems performed and notable only for as above All other review of systems were negative.  ALLERGIES: Allergies  Allergen Reactions    . Enalapril Cough  . Codeine Nausea And Vomiting    REACTION: causes nausea and vomiting  . Macrobid [Nitrofurantoin] Other (See Comments)    Gets elevated liver enzymes, low potassium, headache, nausea and generalized body aches shortly after taking meds.  This is the second time this had occurred.    HOME MEDICATIONS: Current Outpatient Medications  Medication Sig Dispense Refill  . Azilsartan-Chlorthalidone (EDARBYCLOR) 40-25 MG TABS Take 1 tablet by mouth daily. 90 tablet 1  . buPROPion (WELLBUTRIN XL) 150 MG 24 hr tablet Take 1 tablet (150 mg total) by mouth daily. 30 tablet 1  . Cholecalciferol 1.25 MG (50000 UT) capsule Take 1 capsule (50,000 Units total) by mouth once a week. 12 capsule 1  . DULoxetine (CYMBALTA) 60 MG capsule TAKE 1 CAPSULE DAILY 90 capsule 1  . esomeprazole (NEXIUM) 20 MG packet Take 20 mg by mouth daily before breakfast. 90 each 1  . estradiol (ESTRACE) 0.5 MG tablet TK 1 T PO QD    . metoprolol tartrate (LOPRESSOR) 25 MG tablet TAKE 1 TABLET(25 MG) BY MOUTH TWICE DAILY 180 tablet 1  . potassium chloride SA (K-DUR,KLOR-CON) 20 MEQ tablet Take 1 tablet (20 mEq total) by mouth 3 (three) times daily. 90 tablet 3   No current facility-administered medications for this visit.     PAST MEDICAL HISTORY: Past Medical History:  Diagnosis Date  . Anxiety   . Bigeminy   . Cognitive changes   . Depression   . Dysrhythmia    bigeminy  . Elevated liver  enzymes 2017   undiagnosed and levels recently were more normal per pt  . Hypertension   . IBS (irritable bowel syndrome)   . Migraine, unspecified, without mention of intractable migraine without mention of status migrainosus   . PONV (postoperative nausea and vomiting)   . Tachycardia, unspecified    on BBloc    PAST SURGICAL HISTORY: Past Surgical History:  Procedure Laterality Date  . Bilateral myofascial release for recurrent plantar faciitis    . BLADDER SUSPENSION N/A 08/09/2015   Procedure:  TRANSVAGINAL TAPE (TVT) PROCEDURE;  Surgeon: Bobbye Charleston, MD;  Location: [facility];  Service: Gynecology;  Laterality: N/A;  . CARPAL TUNNEL RELEASE    . Colonoscopy with polypectomy  2016   Repeat 2021  . CYSTOCELE REPAIR  08/09/2015   Procedure: ANTERIOR REPAIR (CYSTOCELE);  Surgeon: Bobbye Charleston, MD;  Location: [facility];  Service: Gynecology;;  . Consuela Mimes N/A 08/09/2015   Procedure: CYSTOSCOPY;  Surgeon: Bobbye Charleston, MD;  Location: [facility];  Service: Gynecology;  Laterality: N/A;  . ROBOTIC ASSISTED TOTAL HYSTERECTOMY WITH BILATERAL SALPINGO OOPHERECTOMY Bilateral 08/09/2015   Procedure: ROBOTIC ASSISTED TOTAL HYSTERECTOMY WITH BILATERAL SALPINGO OOPHORECTOMY;  Surgeon: Bobbye Charleston, MD;  Location: [facility];  Service: Gynecology;  Laterality: Bilateral;  . TUBAL LIGATION      FAMILY HISTORY: Family History  Problem Relation Age of Onset  . Cancer Mother        breast cancer-left mastectomy  . Asthma Mother   . Alzheimer's disease Mother   . Transient ischemic attack Mother   . Other Mother        ITP  . Hypertension Father   . Hyperlipidemia Father   . Diabetes Father   . Heart attack Father 69       CABG  . Heart failure Father   . Pancreatitis Father   . Alzheimer's disease Maternal Grandmother   . Chronic fatigue Sister     SOCIAL HISTORY: Social History   Socioeconomic History  . Marital status: Married    Spouse name: Not on file  . Number of children: 2  . Years of education: 41  . Highest education level: High school graduate  Occupational History  . Occupation: customer service/sales  Social Needs  . Financial resource strain: Not on file  . Food insecurity:    Worry: Not on file    Inability: Not on file  . Transportation needs:    Medical: Not on file    Non-medical: Not on file  Tobacco Use  . Smoking status: Never Smoker  . Smokeless tobacco: Never Used  Substance and Sexual Activity  . Alcohol use: Yes    Comment: occasional wine  .  Drug use: No  . Sexual activity: Yes    Partners: Male  Lifestyle  . Physical activity:    Days per week: Not on file    Minutes per session: Not on file  . Stress: Not on file  Relationships  . Social connections:    Talks on phone: Not on file    Gets together: Not on file    Attends religious service: Not on file    Active member of club or organization: Not on file    Attends meetings of clubs or organizations: Not on file    Relationship status: Not on file  . Intimate partner violence:    Fear of current or ex partner: Not on file    Emotionally abused: Not on file    Physically abused: Not on  file    Forced sexual activity: Not on file  Other Topics Concern  . Not on file  Social History Narrative   HSG. 1 year [employer]. Married '86-divorced after 16yr; remarried  '96. No children, 2 step children. 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 Marriage is stable.      Lives at home with husband.   Right-handed.   One Diet Coke per day, 2 cups coffee per day.     PHYSICAL EXAM   Vitals:   04/07/18 1439  BP: 112/62  Pulse: 72  Weight: 208 lb 8 oz (94.6 kg)  Height: _0  (1.676 m)    Not recorded      Body mass index is 33.65 kg/m.  PHYSICAL EXAMNIATION:  Gen: NAD, conversant, well nourised, obese, well groomed                     Cardiovascular: Regular rate rhythm, no peripheral edema, warm, nontender. Eyes: Conjunctivae clear without exudates or hemorrhage Neck: Supple, no carotid bruits. Pulmonary: Clear to auscultation bilaterally   NEUROLOGICAL EXAM: MMSE - Mini Mental State Exam 04/07/2018  Orientation to time 5  Orientation to Place 5  Registration 3  Attention/ Calculation 5  Recall 3  Language- name 2 objects 2  Language- repeat 1  Language- follow 3 step command 3  Language- read & follow direction 1  Write a sentence 1  Copy design 1  Total score 30  Animal naming 10   CRANIAL  NERVES: CN II: Visual fields are full to confrontation. Fundoscopic exam is normal with sharp discs and no vascular changes. Pupils are round equal and briskly reactive to light. CN III, IV, VI: extraocular movement are normal. No ptosis. CN V: Facial sensation is intact to pinprick in all 3 divisions bilaterally. Corneal responses are intact.  CN VII: Face is symmetric with normal eye closure and smile. CN VIII: Hearing is normal to rubbing fingers CN IX, X: Palate elevates symmetrically. Phonation is normal. CN XI: Head turning and shoulder shrug are intact CN XII: Tongue is midline with normal movements and no atrophy.  MOTOR: There is no pronator drift of out-stretched arms. Muscle bulk and tone are normal. Muscle strength is normal.  REFLEXES: Reflexes are 2+ and symmetric at the biceps, triceps, knees, and ankles. Plantar responses are flexor.  SENSORY: Intact to light touch, pinprick, positional sensation and vibratory sensation are intact in fingers and toes.  COORDINATION: Rapid alternating movements and fine finger movements are intact. There is no dysmetria on finger-to-nose and heel-knee-shin.    GAIT/STANCE: Posture is normal. Gait is steady with normal steps, base, arm swing, and turning. Heel and toe walking are normal. Tandem gait is normal.  Romberg is absent.   DIAGNOSTIC DATA (LABS, IMAGING, TESTING) - I reviewed patient records, labs, notes, testing and imaging myself where available.   ASSESSMENT AND PLAN  [name] OROSis a 57y.o. female   Mild cognitive impairment  Mini-Mental status 32/30 Strong family history of dementia  Complete evaluation with MRI of brain  Her mood disorder and stress likely contributed as well  [name] [name] M.D. Ph.D.  [name] [address] [name] [id number]: [phone]: [phone] CC: JJanith Lima MD

## 2018-04-08 ENCOUNTER — Telehealth: Payer: Self-pay | Admitting: Neurology

## 2018-04-08 NOTE — Telephone Encounter (Signed)
left message for pt to call back  [facility] Auth: [id number] (exp. 04/08/18 to 05/07/18)

## 2018-04-25 ENCOUNTER — Other Ambulatory Visit: Payer: Self-pay | Admitting: Internal Medicine

## 2018-04-25 DIAGNOSIS — F330 Major depressive disorder, recurrent, mild: Secondary | ICD-10-CM

## 2018-04-26 ENCOUNTER — Other Ambulatory Visit: Payer: Self-pay | Admitting: Internal Medicine

## 2018-04-26 DIAGNOSIS — I10 Essential (primary) hypertension: Secondary | ICD-10-CM

## 2018-04-30 ENCOUNTER — Other Ambulatory Visit: Payer: Self-pay | Admitting: Internal Medicine

## 2018-05-22 ENCOUNTER — Other Ambulatory Visit: Payer: Self-pay | Admitting: Internal Medicine

## 2018-05-22 DIAGNOSIS — I10 Essential (primary) hypertension: Secondary | ICD-10-CM

## 2018-05-22 MED ORDER — AZILSARTAN-CHLORTHALIDONE 40-25 MG PO TABS: 1.0000 | ORAL_TABLET | Freq: Every day | ORAL | 1 refills | Status: DC

## 2018-05-22 NOTE — Telephone Encounter (Signed)
Copied from [place] [id number]. Topic: Quick Communication - Rx Refill/Question >> May 22, 2018 10:38 AM Reyne Dumas L wrote: Medication: Azilsartan-Chlorthalidone (EDARBYCLOR) 40-25 MG TABS  Has the patient contacted their pharmacy? Yes - states no script on file for her (Agent: If no, request that the patient contact the pharmacy for the refill.) (Agent: If yes, when and what did the pharmacy advise?)  Preferred Pharmacy (with phone number or street name):   [id number] - [place], [place] [phone] (Phone) [phone] (Fax)  Agent: Please be advised that RX refills may take up to 3 business days. We ask that you follow-up with your pharmacy.

## 2018-05-22 NOTE — Telephone Encounter (Signed)
Requested Prescriptions  Pending Prescriptions Disp Refills  . Azilsartan-Chlorthalidone (EDARBYCLOR) 40-25 MG TABS 90 tablet 1    Sig: Take 1 tablet by mouth daily.     Cardiovascular: ARB + Diuretic Combos Passed - 05/22/2018 11:30 AM      Passed - K in normal range and within 180 days    Potassium  Date Value Ref Range Status  02/26/2018 3.6 3.5 - 5.1 mEq/L Final  04/20/2012 3.7 3.5 - 5.1 mmol/L Final         Passed - Na in normal range and within 180 days    Sodium  Date Value Ref Range Status  02/26/2018 137 135 - 145 mEq/L Final  11/23/2014 139 137 - 147 mmol/L Final  04/20/2012 135 (L) 136 - 145 mmol/L Final         Passed - Cr in normal range and within 180 days    Creatinine  Date Value Ref Range Status  04/20/2012 0.63 0.60 - 1.30 mg/dL Final   Creatinine, Ser  Date Value Ref Range Status  02/26/2018 0.89 0.40 - 1.20 mg/dL Final         Passed - Ca in normal range and within 180 days    Calcium  Date Value Ref Range Status  02/26/2018 10.0 8.4 - 10.5 mg/dL Final   Calcium, Total  Date Value Ref Range Status  04/20/2012 9.4 8.5 - 10.1 mg/dL Final         Passed - Patient is not pregnant      Passed - Last BP in normal range    BP Readings from Last 1 Encounters:  04/07/18 112/62         Passed - Valid encounter within last 6 months    Recent Outpatient Visits          2 months ago Diuretic-induced hypokalemia   Holley, Thomas L, MD   3 months ago Essential hypertension   Winder, Thomas L, MD   6 months ago Essential hypertension   Montmorenci, Thomas L, MD   7 months ago Viral URI with cough   [facility] -Chuck Hint, MD   1 year ago Essential hypertension   [facility] -Mayer Camel, MD

## 2018-06-09 ENCOUNTER — Ambulatory Visit (INDEPENDENT_AMBULATORY_CARE_PROVIDER_SITE_OTHER): Payer: Commercial Managed Care - PPO | Admitting: Internal Medicine

## 2018-06-09 ENCOUNTER — Other Ambulatory Visit: Payer: Self-pay

## 2018-06-09 ENCOUNTER — Encounter: Payer: Self-pay | Admitting: Internal Medicine

## 2018-06-09 VITALS — BP 100/60 | HR 86 | Temp 98.4°F | Ht 66.0 in | Wt 209.0 lb

## 2018-06-09 DIAGNOSIS — J0100 Acute maxillary sinusitis, unspecified: Secondary | ICD-10-CM | POA: Diagnosis not present

## 2018-06-09 MED ORDER — PROMETHAZINE-DM 6.25-15 MG/5ML PO SYRP: 5.0000 mL | ORAL_SOLUTION | Freq: Four times a day (QID) | ORAL | 0 refills | Status: AC | PRN

## 2018-06-09 MED ORDER — CEFDINIR 300 MG PO CAPS: 300.0000 mg | ORAL_CAPSULE | Freq: Two times a day (BID) | ORAL | 0 refills | Status: AC

## 2018-06-09 NOTE — Progress Notes (Signed)
Subjective:  Patient ID: [name] [name], female    DOB: 1960/04/02  Age: 58 y.o. MRN: [id number]  CC: Cough (started 06/06/18); Nasal Congestion; and Sore Throat (on 06/05/18)   HPI [name] [name] presents for a 4-day history of sore throat, cough productive of green phlegm, runny nose, and facial pain.  [name] [name] tells me the cough is keeping [name] [name] awake at night.  Outpatient Medications Prior to Visit  Medication Sig Dispense Refill  . Azilsartan-Chlorthalidone (EDARBYCLOR) 40-25 MG TABS Take 1 tablet by mouth daily. 90 tablet 1  . buPROPion (WELLBUTRIN XL) 150 MG 24 hr tablet TAKE 1 TABLET(150 MG) BY MOUTH DAILY 30 tablet 1  . Cholecalciferol 1.25 MG (50000 UT) capsule Take 1 capsule (50,000 Units total) by mouth once a week. 12 capsule 1  . DULoxetine (CYMBALTA) 60 MG capsule TAKE 1 CAPSULE DAILY 90 capsule 1  . esomeprazole (NEXIUM) 20 MG packet Take 20 mg by mouth daily before breakfast. 90 each 1  . estradiol (ESTRACE) 0.5 MG tablet TK 1 T PO QD    . metoprolol tartrate (LOPRESSOR) 25 MG tablet TAKE 1 TABLET(25 MG) BY MOUTH TWICE DAILY 180 tablet 1  . potassium chloride SA (K-DUR,KLOR-CON) 20 MEQ tablet Take 1 tablet (20 mEq total) by mouth 3 (three) times daily. 90 tablet 3   No facility-administered medications prior to visit.     ROS Review of Systems  Constitutional: Negative for chills, fatigue and fever.  HENT: Positive for postnasal drip, rhinorrhea, sinus pressure, sinus pain and sore throat. Negative for congestion, facial swelling and trouble swallowing.   Respiratory: Positive for cough. Negative for chest tightness, shortness of breath and wheezing.   Cardiovascular: Negative for chest pain, palpitations and leg swelling.  Gastrointestinal: Negative for abdominal pain, constipation, diarrhea, nausea and vomiting.  Genitourinary: Negative.  Negative for difficulty urinating.  Musculoskeletal: Negative.  Negative for arthralgias and myalgias.  Skin: Negative.  Negative for  color change and pallor.  Neurological: Negative.  Negative for dizziness, weakness and light-headedness.  Hematological: Negative for adenopathy. Does not bruise/bleed easily.  Psychiatric/Behavioral: Negative.     Objective:  BP 100/60 (BP Location: Left Arm, Patient Position: Sitting, Cuff Size: Large)   Pulse 86   Temp 98.4 F (36.9 C) (Oral)   Ht 5\' 6"  (1.676 m)   Wt 209 lb (94.8 kg)   LMP  (LMP Unknown)   SpO2 98%   BMI 33.73 kg/m   BP Readings from Last 3 Encounters:  06/09/18 100/60  04/07/18 112/62  02/26/18 132/76    Wt Readings from Last 3 Encounters:  06/09/18 209 lb (94.8 kg)  04/07/18 208 lb 8 oz (94.6 kg)  02/26/18 208 lb (94.3 kg)    Physical Exam Vitals signs reviewed.  Constitutional:      General: [name] [name] is not in acute distress.    Appearance: [name] [name] is not ill-appearing, toxic-appearing or diaphoretic.  HENT:     Right Ear: Tympanic membrane and ear canal normal.     Left Ear: Tympanic membrane and ear canal normal.     Nose: Rhinorrhea present. No mucosal edema or congestion.     Right Nostril: No epistaxis.     Left Nostril: No epistaxis.     Right Sinus: Maxillary sinus tenderness present. No frontal sinus tenderness.     Left Sinus: Maxillary sinus tenderness present. No frontal sinus tenderness.     Mouth/Throat:     Lips: Pink.     Mouth: Mucous membranes are moist.  Pharynx: Posterior oropharyngeal erythema present. No pharyngeal swelling or oropharyngeal exudate.     Tonsils: No tonsillar exudate. 0 on the right. 0 on the left.  Neck:     Musculoskeletal: Normal range of motion and neck supple.  Cardiovascular:     Rate and Rhythm: Normal rate and regular rhythm.     Heart sounds: No murmur. No gallop.   Pulmonary:     Effort: Pulmonary effort is normal.     Breath sounds: Normal breath sounds. No stridor. No wheezing, rhonchi or rales.  Abdominal:     Palpations: Abdomen is soft. There is no hepatomegaly or splenomegaly.      Tenderness: There is no abdominal tenderness.  Musculoskeletal: Normal range of motion.        General: No swelling.     Right lower leg: No edema.     Left lower leg: No edema.  Lymphadenopathy:     Cervical: No cervical adenopathy.  Skin:    General: Skin is warm and dry.     Lab Results  Component Value Date   WBC 7.9 01/28/2018   HGB 14.6 01/28/2018   HCT 44.0 01/28/2018   PLT 302.0 01/28/2018   GLUCOSE 112 (H) 02/26/2018   CHOL 179 10/29/2017   TRIG 143.0 10/29/2017   HDL 43.00 10/29/2017   LDLCALC 107 (H) 10/29/2017   ALT 34 01/28/2018   AST 26 01/28/2018   NA 137 02/26/2018   K 3.6 02/26/2018   CL 97 02/26/2018   CREATININE 0.89 02/26/2018   BUN 19 02/26/2018   CO2 34 (H) 02/26/2018   TSH 2.16 10/29/2017   HGBA1C 5.3 08/27/2015    No results found.  Assessment & Plan:   [name] [name] was seen today for cough, nasal congestion and sore throat.  Diagnoses and all orders for this visit:  Acute non-recurrent maxillary sinusitis -     cefdinir (OMNICEF) 300 MG capsule; Take 1 capsule (300 mg total) by mouth 2 (two) times daily for 10 days. -     promethazine-dextromethorphan (PROMETHAZINE-DM) 6.25-15 MG/5ML syrup; Take 5 mLs by mouth 4 (four) times daily as needed for up to 7 days for cough.   I am having [name] [name] start on cefdinir and promethazine-dextromethorphan. I am also having [name] [name] maintain [name] [name] estradiol, esomeprazole, potassium chloride SA, Cholecalciferol, buPROPion, metoprolol tartrate, DULoxetine, and Azilsartan-Chlorthalidone.  Meds ordered this encounter  Medications  . cefdinir (OMNICEF) 300 MG capsule    Sig: Take 1 capsule (300 mg total) by mouth 2 (two) times daily for 10 days.    Dispense:  20 capsule    Refill:  0  . promethazine-dextromethorphan (PROMETHAZINE-DM) 6.25-15 MG/5ML syrup    Sig: Take 5 mLs by mouth 4 (four) times daily as needed for up to 7 days for cough.    Dispense:  118 mL    Refill:  0     Follow-up: Return if  symptoms worsen or fail to improve.  Scarlette Calico, MD

## 2018-06-09 NOTE — Patient Instructions (Signed)
Sinusitis, Adult  Sinusitis is inflammation of your sinuses. Sinuses are hollow spaces in the bones around your face. Your sinuses are located:   Around your eyes.   In the middle of your forehead.   Behind your nose.   In your cheekbones.  Mucus normally drains out of your sinuses. When your nasal tissues become inflamed or swollen, mucus can become trapped or blocked. This allows bacteria, viruses, and fungi to grow, which leads to infection. Most infections of the sinuses are caused by a virus.  Sinusitis can develop quickly. It can last for up to 4 weeks (acute) or for more than 12 weeks (chronic). Sinusitis often develops after a cold.  What are the causes?  This condition is caused by anything that creates swelling in the sinuses or stops mucus from draining. This includes:   Allergies.   Asthma.   Infection from bacteria or viruses.   Deformities or blockages in your nose or sinuses.   Abnormal growths in the nose (nasal polyps).   Pollutants, such as chemicals or irritants in the air.   Infection from fungi (rare).  What increases the risk?  You are more likely to develop this condition if you:   Have a weak body defense system (immune system).   Do a lot of swimming or diving.   Overuse nasal sprays.   Smoke.  What are the signs or symptoms?  The main symptoms of this condition are pain and a feeling of pressure around the affected sinuses. Other symptoms include:   Stuffy nose or congestion.   Thick drainage from your nose.   Swelling and warmth over the affected sinuses.   Headache.   Upper toothache.   A cough that may get worse at night.   Extra mucus that collects in the throat or the back of the nose (postnasal drip).   Decreased sense of smell and taste.   Fatigue.   A fever.   Sore throat.   Bad breath.  How is this diagnosed?  This condition is diagnosed based on:   Your symptoms.   Your medical history.   A physical exam.   Tests to find out if your condition is  acute or chronic. This may include:  ? Checking your nose for nasal polyps.  ? Viewing your sinuses using a device that has a light (endoscope).  ? Testing for allergies or bacteria.  ? Imaging tests, such as an MRI or CT scan.  In rare cases, a bone biopsy may be done to rule out more serious types of fungal sinus disease.  How is this treated?  Treatment for sinusitis depends on the cause and whether your condition is chronic or acute.   If caused by a virus, your symptoms should go away on their own within 10 days. You may be given medicines to relieve symptoms. They include:  ? Medicines that shrink swollen nasal passages (topical intranasal decongestants).  ? Medicines that treat allergies (antihistamines).  ? A spray that eases inflammation of the nostrils (topical intranasal corticosteroids).  ? Rinses that help get rid of thick mucus in your nose (nasal saline washes).   If caused by bacteria, your health care provider may recommend waiting to see if your symptoms improve. Most bacterial infections will get better without antibiotic medicine. You may be given antibiotics if you have:  ? A severe infection.  ? A weak immune system.   If caused by narrow nasal passages or nasal polyps, you may need   to have surgery.  Follow these instructions at home:  Medicines   Take, use, or apply over-the-counter and prescription medicines only as told by your health care provider. These may include nasal sprays.   If you were prescribed an antibiotic medicine, take it as told by your health care provider. Do not stop taking the antibiotic even if you start to feel better.  Hydrate and humidify     Drink enough fluid to keep your urine pale yellow. Staying hydrated will help to thin your mucus.   Use a cool mist humidifier to keep the humidity level in your home above 50%.   Inhale steam for 10-15 minutes, 3-4 times a day, or as told by your health care provider. You can do this in the bathroom while a hot shower is  running.   Limit your exposure to cool or dry air.  Rest   Rest as much as possible.   Sleep with your head raised (elevated).   Make sure you get enough sleep each night.  General instructions     Apply a warm, moist washcloth to your face 3-4 times a day or as told by your health care provider. This will help with discomfort.   Wash your hands often with soap and water to reduce your exposure to germs. If soap and water are not available, use hand sanitizer.   Do not smoke. Avoid being around people who are smoking (secondhand smoke).   Keep all follow-up visits as told by your health care provider. This is important.  Contact a health care provider if:   You have a fever.   Your symptoms get worse.   Your symptoms do not improve within 10 days.  Get help right away if:   You have a severe headache.   You have persistent vomiting.   You have severe pain or swelling around your face or eyes.   You have vision problems.   You develop confusion.   Your neck is stiff.   You have trouble breathing.  Summary   Sinusitis is soreness and inflammation of your sinuses. Sinuses are hollow spaces in the bones around your face.   This condition is caused by nasal tissues that become inflamed or swollen. The swelling traps or blocks the flow of mucus. This allows bacteria, viruses, and fungi to grow, which leads to infection.   If you were prescribed an antibiotic medicine, take it as told by your health care provider. Do not stop taking the antibiotic even if you start to feel better.   Keep all follow-up visits as told by your health care provider. This is important.  This information is not intended to replace advice given to you by your health care provider. Make sure you discuss any questions you have with your health care provider.  Document Released: 03/04/2005 Document Revised: 08/04/2017 Document Reviewed: 08/04/2017  [employer]vier Interactive Patient Education  2019 [employer]

## 2018-06-14 ENCOUNTER — Encounter: Payer: Self-pay | Admitting: Internal Medicine

## 2018-06-24 ENCOUNTER — Other Ambulatory Visit: Payer: Self-pay | Admitting: Internal Medicine

## 2018-06-24 DIAGNOSIS — F330 Major depressive disorder, recurrent, mild: Secondary | ICD-10-CM

## 2018-06-24 MED ORDER — BUPROPION HCL ER (XL) 150 MG PO TB24: ORAL_TABLET | ORAL | 1 refills | Status: DC

## 2018-07-02 ENCOUNTER — Telehealth: Payer: Self-pay

## 2018-07-02 NOTE — Telephone Encounter (Signed)
Patient mentioned that she would like to cancel her appt due to her not wanting the MRI done. I advised the patient that she could discuss this with Judson Roch, NP during the webex visit, but the patient insisted that her appt be cancelled.

## 2018-07-07 ENCOUNTER — Ambulatory Visit: Payer: Commercial Managed Care - PPO | Admitting: Neurology

## 2018-07-30 ENCOUNTER — Telehealth: Payer: Self-pay | Admitting: *Deleted

## 2018-07-30 NOTE — Telephone Encounter (Signed)
LM with husband to give to wife to call, she was at work and he did not know her schedule.  He will have her call back.

## 2018-10-27 ENCOUNTER — Other Ambulatory Visit: Payer: Self-pay | Admitting: Internal Medicine

## 2018-10-29 ENCOUNTER — Other Ambulatory Visit: Payer: Self-pay | Admitting: Internal Medicine

## 2018-11-03 ENCOUNTER — Other Ambulatory Visit: Payer: Self-pay | Admitting: Internal Medicine

## 2018-11-03 DIAGNOSIS — I10 Essential (primary) hypertension: Secondary | ICD-10-CM

## 2018-11-03 MED ORDER — METOPROLOL TARTRATE 25 MG PO TABS: ORAL_TABLET | ORAL | 1 refills | Status: DC

## 2018-11-27 ENCOUNTER — Telehealth: Payer: Self-pay

## 2018-11-27 NOTE — Telephone Encounter (Signed)
Rf rq for [employer]. PCP needs pt to schedule an appt.   Called pt at home number. Spoke to spouse and he will relay message to patient.

## 2018-12-07 ENCOUNTER — Other Ambulatory Visit: Payer: Self-pay | Admitting: Internal Medicine

## 2018-12-07 DIAGNOSIS — I10 Essential (primary) hypertension: Secondary | ICD-10-CM

## 2018-12-07 MED ORDER — EDARBYCLOR 40-25 MG PO TABS: 1.0000 | ORAL_TABLET | Freq: Every day | ORAL | 0 refills | Status: DC

## 2018-12-22 ENCOUNTER — Encounter: Payer: Self-pay | Admitting: Internal Medicine

## 2018-12-22 ENCOUNTER — Other Ambulatory Visit (INDEPENDENT_AMBULATORY_CARE_PROVIDER_SITE_OTHER): Payer: Commercial Managed Care - PPO

## 2018-12-22 ENCOUNTER — Ambulatory Visit (INDEPENDENT_AMBULATORY_CARE_PROVIDER_SITE_OTHER): Payer: Commercial Managed Care - PPO | Admitting: Internal Medicine

## 2018-12-22 ENCOUNTER — Other Ambulatory Visit: Payer: Self-pay

## 2018-12-22 VITALS — BP 100/64 | HR 80 | Temp 98.5°F | Resp 16 | Ht 66.0 in | Wt 216.0 lb

## 2018-12-22 DIAGNOSIS — Z0001 Encounter for general adult medical examination with abnormal findings: Secondary | ICD-10-CM | POA: Insufficient documentation

## 2018-12-22 DIAGNOSIS — R739 Hyperglycemia, unspecified: Secondary | ICD-10-CM | POA: Diagnosis not present

## 2018-12-22 DIAGNOSIS — E559 Vitamin D deficiency, unspecified: Secondary | ICD-10-CM

## 2018-12-22 DIAGNOSIS — Z0000 Encounter for general adult medical examination without abnormal findings: Secondary | ICD-10-CM

## 2018-12-22 DIAGNOSIS — I10 Essential (primary) hypertension: Secondary | ICD-10-CM

## 2018-12-22 DIAGNOSIS — Z23 Encounter for immunization: Secondary | ICD-10-CM | POA: Diagnosis not present

## 2018-12-22 DIAGNOSIS — Z1231 Encounter for screening mammogram for malignant neoplasm of breast: Secondary | ICD-10-CM

## 2018-12-22 LAB — BASIC METABOLIC PANEL
BUN: 19 mg/dL (ref 6–23)
CO2: 31 mEq/L (ref 19–32)
Calcium: 9.9 mg/dL (ref 8.4–10.5)
Chloride: 97 mEq/L (ref 96–112)
Creatinine, Ser: 1.32 mg/dL — ABNORMAL HIGH (ref 0.40–1.20)
GFR: 41.33 mL/min — ABNORMAL LOW (ref >60.00)
Glucose, Bld: 119 mg/dL — ABNORMAL HIGH (ref 70–99)
Potassium: 4.1 mEq/L (ref 3.5–5.1)
Sodium: 135 mEq/L (ref 135–145)

## 2018-12-22 LAB — URINALYSIS, ROUTINE W REFLEX MICROSCOPIC
Bilirubin Urine: NEGATIVE
Hgb urine dipstick: NEGATIVE
Ketones, ur: NEGATIVE
Leukocytes,Ua: NEGATIVE
Nitrite: NEGATIVE
Specific Gravity, Urine: 1.01 (ref 1.000–1.030)
Total Protein, Urine: NEGATIVE
Urine Glucose: NEGATIVE
Urobilinogen, UA: 0.2 (ref 0.0–1.0)
pH: 6 (ref 5.0–8.0)

## 2018-12-22 LAB — CBC WITH DIFFERENTIAL/PLATELET
Basophils Absolute: 0.1 10*3/uL (ref 0.0–0.1)
Basophils Relative: 1.2 % (ref 0.0–3.0)
Eosinophils Absolute: 0.2 10*3/uL (ref 0.0–0.7)
Eosinophils Relative: 2.1 % (ref 0.0–5.0)
HCT: 36.9 % (ref 36.0–46.0)
Hemoglobin: 12.3 g/dL (ref 12.0–15.0)
Lymphocytes Relative: 29.1 % (ref 12.0–46.0)
Lymphs Abs: 2.8 10*3/uL (ref 0.7–4.0)
MCHC: 33.3 g/dL (ref 30.0–36.0)
MCV: 83 fl (ref 78.0–100.0)
Monocytes Absolute: 1.1 10*3/uL — ABNORMAL HIGH (ref 0.1–1.0)
Monocytes Relative: 11.8 % (ref 3.0–12.0)
Neutro Abs: 5.4 10*3/uL (ref 1.4–7.7)
Neutrophils Relative %: 55.8 % (ref 43.0–77.0)
Platelets: 396 10*3/uL (ref 150.0–400.0)
RBC: 4.45 Mil/uL (ref 3.87–5.11)
RDW: 14.5 % (ref 11.5–15.5)
WBC: 9.7 10*3/uL (ref 4.0–10.5)

## 2018-12-22 LAB — HEPATIC FUNCTION PANEL
ALT: 28 U/L (ref 0–35)
AST: 24 U/L (ref 0–37)
Albumin: 4.1 g/dL (ref 3.5–5.2)
Alkaline Phosphatase: 59 U/L (ref 39–117)
Bilirubin, Direct: 0.1 mg/dL (ref 0.0–0.3)
Total Bilirubin: 0.3 mg/dL (ref 0.2–1.2)
Total Protein: 7.1 g/dL (ref 6.0–8.3)

## 2018-12-22 LAB — LIPID PANEL
Cholesterol: 197 mg/dL (ref 0–200)
HDL: 39.1 mg/dL (ref >39.00)
LDL Cholesterol: 120 mg/dL — ABNORMAL HIGH (ref 0–99)
NonHDL: 158.26
Total CHOL/HDL Ratio: 5
Triglycerides: 191 mg/dL — ABNORMAL HIGH (ref 0.0–149.0)
VLDL: 38.2 mg/dL (ref 0.0–40.0)

## 2018-12-22 LAB — HEMOGLOBIN A1C: Hgb A1c MFr Bld: 6.1 % (ref 4.6–6.5)

## 2018-12-22 LAB — VITAMIN D 25 HYDROXY (VIT D DEFICIENCY, FRACTURES): VITD: 40.39 ng/mL (ref 30.00–100.00)

## 2018-12-22 LAB — TSH: TSH: 2.89 u[IU]/mL (ref 0.35–4.50)

## 2018-12-22 NOTE — Progress Notes (Signed)
Subjective:  Patient ID: [name] [name], female    DOB: 17-May-1960  Age: 58 y.o. MRN: [id number]  CC: Annual Exam and Hypertension   HPI [name] [name] presents for f/up - She complains of a 1 day history of lightheadedness.  She denies headache, chest pain, diaphoresis, dizziness, palpitations, edema, or fatigue.  Outpatient Medications Prior to Visit  Medication Sig Dispense Refill  . buPROPion (WELLBUTRIN XL) 150 MG 24 hr tablet TAKE 1 TABLET(150 MG) BY MOUTH DAILY 90 tablet 1  . Cholecalciferol 1.25 MG (50000 UT) capsule Take 1 capsule (50,000 Units total) by mouth once a week. 12 capsule 1  . DULoxetine (CYMBALTA) 60 MG capsule TAKE 1 CAPSULE DAILY 90 capsule 1  . esomeprazole (NEXIUM) 20 MG packet Take 20 mg by mouth daily before breakfast. 90 each 1  . estradiol (ESTRACE) 0.5 MG tablet TK 1 T PO QD    . metoprolol tartrate (LOPRESSOR) 25 MG tablet TAKE 1 TABLET(25 MG) BY MOUTH TWICE DAILY 180 tablet 1  . potassium chloride SA (K-DUR,KLOR-CON) 20 MEQ tablet Take 1 tablet (20 mEq total) by mouth 3 (three) times daily. 90 tablet 3  . Azilsartan-Chlorthalidone (EDARBYCLOR) 40-25 MG TABS Take 1 tablet by mouth daily. 30 tablet 0   No facility-administered medications prior to visit.     ROS Review of Systems  Constitutional: Positive for fatigue and unexpected weight change (wt gain). Negative for appetite change, chills and diaphoresis.  HENT: Negative.   Eyes: Negative for visual disturbance.  Respiratory: Negative for cough, chest tightness, shortness of breath and wheezing.   Cardiovascular: Negative for chest pain, palpitations and leg swelling.  Gastrointestinal: Negative for abdominal pain, constipation, diarrhea, nausea and vomiting.  Endocrine: Negative.   Genitourinary: Negative.  Negative for difficulty urinating and hematuria.  Musculoskeletal: Negative for arthralgias and myalgias.  Skin: Negative for color change and pallor.  Neurological: Positive for  light-headedness. Negative for dizziness, weakness, numbness and headaches.  Hematological: Negative for adenopathy. Does not bruise/bleed easily.  Psychiatric/Behavioral: Negative.     Objective:  BP 100/64 (BP Location: Left Arm, Patient Position: Sitting, Cuff Size: Large)   Pulse 80   Temp 98.5 F (36.9 C) (Oral)   Resp 16   Ht 5\' 6"  (1.676 m)   Wt 216 lb (98 kg)   LMP  (LMP Unknown)   SpO2 96%   BMI 34.86 kg/m   BP Readings from Last 3 Encounters:  12/22/18 100/64  06/09/18 100/60  04/07/18 112/62    Wt Readings from Last 3 Encounters:  12/22/18 216 lb (98 kg)  06/09/18 209 lb (94.8 kg)  04/07/18 208 lb 8 oz (94.6 kg)    Physical Exam Vitals signs reviewed.  Constitutional:      Appearance: She is obese. She is not ill-appearing or diaphoretic.  HENT:     Nose: Nose normal.     Mouth/Throat:     Mouth: Mucous membranes are moist.  Eyes:     General: No scleral icterus.    Conjunctiva/sclera: Conjunctivae normal.  Neck:     Musculoskeletal: Normal range of motion.  Cardiovascular:     Rate and Rhythm: Normal rate and regular rhythm.     Heart sounds: No murmur. No gallop.      Comments: EKG ---  Sinus  Rhythm  WITHIN NORMAL LIMITS  Pulmonary:     Effort: Pulmonary effort is normal.     Breath sounds: No stridor. No wheezing or rhonchi.  Abdominal:  General: Abdomen is protuberant. Bowel sounds are normal. There is no distension.     Palpations: Abdomen is soft. There is no hepatomegaly, splenomegaly or mass.     Tenderness: There is no abdominal tenderness. There is no guarding.  Musculoskeletal:     Right lower leg: No edema.     Left lower leg: No edema.  Lymphadenopathy:     Cervical: No cervical adenopathy.  Skin:    General: Skin is warm and dry.     Findings: No rash.  Neurological:     General: No focal deficit present.     Mental Status: She is alert and oriented to person, place, and time. Mental status is at baseline.  Psychiatric:         Mood and Affect: Mood normal.        Behavior: Behavior normal.     Lab Results  Component Value Date   WBC 9.7 12/22/2018   HGB 12.3 12/22/2018   HCT 36.9 12/22/2018   PLT 396.0 12/22/2018   GLUCOSE 119 (H) 12/22/2018   CHOL 197 12/22/2018   TRIG 191.0 (H) 12/22/2018   HDL 39.10 12/22/2018   LDLCALC 120 (H) 12/22/2018   ALT 28 12/22/2018   AST 24 12/22/2018   NA 135 12/22/2018   K 4.1 12/22/2018   CL 97 12/22/2018   CREATININE 1.32 (H) 12/22/2018   BUN 19 12/22/2018   CO2 31 12/22/2018   TSH 2.89 12/22/2018   HGBA1C 6.1 12/22/2018    No results found.  Assessment & Plan:   [name] [name] was seen today for annual exam and hypertension.  Diagnoses and all orders for this visit:  Essential hypertension-  Her systolic blood pressure is down to 100 and she is symptomatic.  I have asked her to stop taking the ARB and thiazide diuretic but to stay on the current dose of the beta-blocker.  She has had a slight increase in her creatinine but her BUN is normal.  I expect this to improve with the discontinuation of the ARB and thiazide diuretic.  Her EKG is negative for LVH or ischemia. -     CBC with Differential/Platelet; Future -     Basic metabolic panel; Future -     TSH; Future -     Urinalysis, Routine w reflex microscopic; Future -     EKG 12-Lead  Routine health maintenance- Exam completed, labs reviewed, vaccines reviewed and updated, colon cancer screening and PAP are up-to-date, she is referred for screening mammogram. -     Lipid panel; Future  Vitamin D deficiency disease- Her vitamin D level is normal now. -     VITAMIN D 25 Hydroxy (Vit-D Deficiency, Fractures); Future  Need for influenza vaccination -     Flu Vaccine QUAD 36+ mos IM  Need for shingles vaccine -     Varicella-zoster vaccine IM (Shingrix)  Hyperglycemia- Her A1c is at 6.1%.  She is prediabetic.  Medical therapy is not indicated.  She agrees to improve her lifestyle modifications. -      Basic metabolic panel; Future -     Hemoglobin A1c; Future -     Hepatic function panel; Future  Visit for screening mammogram -     MM DIGITAL SCREENING BILATERAL; Future   I have discontinued [name] [name]'s Edarbyclor. I am also having her maintain her estradiol, esomeprazole, potassium chloride SA, Cholecalciferol, buPROPion, DULoxetine, and metoprolol tartrate.  No orders of the defined types were placed in this encounter.  Follow-up: Return in about 1 week (around 12/29/2018).  Scarlette Calico, MD

## 2018-12-22 NOTE — Patient Instructions (Signed)
Health Maintenance, Female Adopting a healthy lifestyle and getting preventive care are important in promoting health and wellness. Ask your health care provider about:  The right schedule for you to have regular tests and exams.  Things you can do on your own to prevent diseases and keep yourself healthy. What should I know about diet, weight, and exercise? Eat a healthy diet   Eat a diet that includes plenty of vegetables, fruits, low-fat dairy products, and lean protein.  Do not eat a lot of foods that are high in solid fats, added sugars, or sodium. Maintain a healthy weight Body mass index (BMI) is used to identify weight problems. It estimates body fat based on height and weight. Your health care provider can help determine your BMI and help you achieve or maintain a healthy weight. Get regular exercise Get regular exercise. This is one of the most important things you can do for your health. Most adults should:  Exercise for at least 150 minutes each week. The exercise should increase your heart rate and make you sweat (moderate-intensity exercise).  Do strengthening exercises at least twice a week. This is in addition to the moderate-intensity exercise.  Spend less time sitting. Even light physical activity can be beneficial. Watch cholesterol and blood lipids Have your blood tested for lipids and cholesterol at 58 years of age, then have this test every 5 years. Have your cholesterol levels checked more often if:  Your lipid or cholesterol levels are high.  You are older than 58 years of age.  You are at high risk for heart disease. What should I know about cancer screening? Depending on your health history and family history, you may need to have cancer screening at various ages. This may include screening for:  Breast cancer.  Cervical cancer.  Colorectal cancer.  Skin cancer.  Lung cancer. What should I know about heart disease, diabetes, and high blood  pressure? Blood pressure and heart disease  High blood pressure causes heart disease and increases the risk of stroke. This is more likely to develop in people who have high blood pressure readings, are of African descent, or are overweight.  Have your blood pressure checked: ? Every 3-5 years if you are 18-39 years of age. ? Every year if you are 40 years old or older. Diabetes Have regular diabetes screenings. This checks your fasting blood sugar level. Have the screening done:  Once every three years after age 40 if you are at a normal weight and have a low risk for diabetes.  More often and at a younger age if you are overweight or have a high risk for diabetes. What should I know about preventing infection? Hepatitis B If you have a higher risk for hepatitis B, you should be screened for this virus. Talk with your health care provider to find out if you are at risk for hepatitis B infection. Hepatitis C Testing is recommended for:  Everyone born from 1945 through 1965.  Anyone with known risk factors for hepatitis C. Sexually transmitted infections (STIs)  Get screened for STIs, including gonorrhea and chlamydia, if: ? You are sexually active and are younger than 58 years of age. ? You are older than 58 years of age and your health care provider tells you that you are at risk for this type of infection. ? Your sexual activity has changed since you were last screened, and you are at increased risk for chlamydia or gonorrhea. Ask your health care provider if   you are at risk.  Ask your health care provider about whether you are at high risk for HIV. Your health care provider may recommend a prescription medicine to help prevent HIV infection. If you choose to take medicine to prevent HIV, you should first get tested for HIV. You should then be tested every 3 months for as long as you are taking the medicine. Pregnancy  If you are about to stop having your period (premenopausal) and  you may become pregnant, seek counseling before you get pregnant.  Take 400 to 800 micrograms (mcg) of folic acid every day if you become pregnant.  Ask for birth control (contraception) if you want to prevent pregnancy. Osteoporosis and menopause Osteoporosis is a disease in which the bones lose minerals and strength with aging. This can result in bone fractures. If you are 65 years old or older, or if you are at risk for osteoporosis and fractures, ask your health care provider if you should:  Be screened for bone loss.  Take a calcium or vitamin D supplement to lower your risk of fractures.  Be given hormone replacement therapy (HRT) to treat symptoms of menopause. Follow these instructions at home: Lifestyle  Do not use any products that contain nicotine or tobacco, such as cigarettes, e-cigarettes, and chewing tobacco. If you need help quitting, ask your health care provider.  Do not use street drugs.  Do not share needles.  Ask your health care provider for help if you need support or information about quitting drugs. Alcohol use  Do not drink alcohol if: ? Your health care provider tells you not to drink. ? You are pregnant, may be pregnant, or are planning to become pregnant.  If you drink alcohol: ? Limit how much you use to 0-1 drink a day. ? Limit intake if you are breastfeeding.  Be aware of how much alcohol is in your drink. In the [place] one drink equals one 12 oz bottle of beer (355 mL), one 5 oz glass of wine (148 mL), or one 1 oz glass of hard liquor (44 mL). General instructions  Schedule regular health, dental, and eye exams.  Stay current with your vaccines.  Tell your health care provider if: ? You often feel depressed. ? You have ever been abused or do not feel safe at home. Summary  Adopting a healthy lifestyle and getting preventive care are important in promoting health and wellness.  Follow your health care provider's instructions about healthy  diet, exercising, and getting tested or screened for diseases.  Follow your health care provider's instructions on monitoring your cholesterol and blood pressure. This information is not intended to replace advice given to you by your health care provider. Make sure you discuss any questions you have with your health care provider. Document Released: 09/17/2010 Document Revised: 02/25/2018 Document Reviewed: 02/25/2018 Elsevier Patient Education  2020 Elsevier Inc.  

## 2018-12-23 ENCOUNTER — Encounter: Payer: Self-pay | Admitting: Internal Medicine

## 2018-12-28 ENCOUNTER — Other Ambulatory Visit: Payer: Self-pay | Admitting: Internal Medicine

## 2018-12-28 DIAGNOSIS — F330 Major depressive disorder, recurrent, mild: Secondary | ICD-10-CM

## 2018-12-28 MED ORDER — BUPROPION HCL ER (XL) 150 MG PO TB24: ORAL_TABLET | ORAL | 1 refills | Status: DC

## 2018-12-29 ENCOUNTER — Encounter: Payer: Self-pay | Admitting: Internal Medicine

## 2018-12-29 ENCOUNTER — Other Ambulatory Visit: Payer: Self-pay

## 2018-12-29 ENCOUNTER — Ambulatory Visit (INDEPENDENT_AMBULATORY_CARE_PROVIDER_SITE_OTHER): Payer: Commercial Managed Care - PPO | Admitting: Internal Medicine

## 2018-12-29 ENCOUNTER — Other Ambulatory Visit (INDEPENDENT_AMBULATORY_CARE_PROVIDER_SITE_OTHER): Payer: Commercial Managed Care - PPO

## 2018-12-29 VITALS — BP 126/82 | HR 60 | Temp 97.8°F | Resp 16 | Ht 66.0 in | Wt 221.0 lb

## 2018-12-29 DIAGNOSIS — R600 Localized edema: Secondary | ICD-10-CM | POA: Diagnosis not present

## 2018-12-29 DIAGNOSIS — I10 Essential (primary) hypertension: Secondary | ICD-10-CM

## 2018-12-29 LAB — BASIC METABOLIC PANEL
BUN: 17 mg/dL (ref 6–23)
CO2: 29 mEq/L (ref 19–32)
Calcium: 9.3 mg/dL (ref 8.4–10.5)
Chloride: 101 mEq/L (ref 96–112)
Creatinine, Ser: 1.04 mg/dL (ref 0.40–1.20)
GFR: 54.42 mL/min — ABNORMAL LOW (ref >60.00)
Glucose, Bld: 118 mg/dL — ABNORMAL HIGH (ref 70–99)
Potassium: 3.5 mEq/L (ref 3.5–5.1)
Sodium: 137 mEq/L (ref 135–145)

## 2018-12-29 MED ORDER — INDAPAMIDE 1.25 MG PO TABS: 1.2500 mg | ORAL_TABLET | Freq: Every day | ORAL | 1 refills | Status: DC

## 2018-12-29 NOTE — Progress Notes (Signed)
Subjective:  Patient ID: [name] [name], female    DOB: [birth year]  Age: 58 y.o. MRN: [id number]  CC: Hypertension   HPI TALANA COPPOCK presents for f/up - She was recently seen and was found to be hypotensive and had an increase in her creatinine.  She returns for a one-week follow-up.  She complains that over the last week she has gained weight and has edema in her lower extremities.  She tells me her blood pressure has normalized.  She has stopped taking the ARB/thiazide diuretic combination.  She continues to take the same dose of metoprolol.  She denies dizziness, lightheadedness, palpitations, chest pain, or shortness of breath.  Outpatient Medications Prior to Visit  Medication Sig Dispense Refill  . buPROPion (WELLBUTRIN XL) 150 MG 24 hr tablet TAKE 1 TABLET(150 MG) BY MOUTH DAILY 90 tablet 1  . Cholecalciferol 1.25 MG (50000 UT) capsule Take 1 capsule (50,000 Units total) by mouth once a week. 12 capsule 1  . DULoxetine (CYMBALTA) 60 MG capsule TAKE 1 CAPSULE DAILY 90 capsule 1  . esomeprazole (NEXIUM) 20 MG packet Take 20 mg by mouth daily before breakfast. 90 each 1  . estradiol (ESTRACE) 0.5 MG tablet TK 1 T PO QD    . metoprolol tartrate (LOPRESSOR) 25 MG tablet TAKE 1 TABLET(25 MG) BY MOUTH TWICE DAILY 180 tablet 1  . potassium chloride SA (K-DUR,KLOR-CON) 20 MEQ tablet Take 1 tablet (20 mEq total) by mouth 3 (three) times daily. 90 tablet 3   No facility-administered medications prior to visit.     ROS Review of Systems  Constitutional: Positive for unexpected weight change (wt gain). Negative for diaphoresis and fatigue.  HENT: Negative.   Eyes: Negative.   Respiratory: Negative for cough, chest tightness, shortness of breath and wheezing.   Cardiovascular: Positive for leg swelling. Negative for chest pain and palpitations.  Gastrointestinal: Negative for abdominal pain, constipation, diarrhea, nausea and vomiting.  Endocrine: Negative.   Genitourinary: Negative.   Negative for difficulty urinating, dysuria and hematuria.  Musculoskeletal: Negative.  Negative for arthralgias and myalgias.  Skin: Negative.  Negative for color change and pallor.  Neurological: Negative for dizziness, weakness and light-headedness.  Hematological: Negative for adenopathy. Does not bruise/bleed easily.  Psychiatric/Behavioral: Negative.     Objective:  BP 126/82 (BP Location: Left Arm, Patient Position: Sitting, Cuff Size: Large)   Pulse 60   Temp 97.8 F (36.6 C) (Oral)   Resp 16   Ht 5\' 6"  (1.676 m)   Wt 221 lb (100.2 kg)   LMP  (LMP Unknown)   SpO2 98%   BMI 35.67 kg/m   BP Readings from Last 3 Encounters:  12/29/18 126/82  12/22/18 100/64  06/09/18 100/60    Wt Readings from Last 3 Encounters:  12/29/18 221 lb (100.2 kg)  12/22/18 216 lb (98 kg)  06/09/18 209 lb (94.8 kg)    Physical Exam Vitals signs reviewed.  Constitutional:      Appearance: Normal appearance. She is not ill-appearing or diaphoretic.  HENT:     Nose: Nose normal.     Mouth/Throat:     Mouth: Mucous membranes are moist.  Eyes:     General: No scleral icterus.    Conjunctiva/sclera: Conjunctivae normal.  Neck:     Musculoskeletal: Neck supple.  Cardiovascular:     Rate and Rhythm: Normal rate and regular rhythm.     Heart sounds: No murmur. No gallop.   Pulmonary:     Effort: Pulmonary effort  is normal.     Breath sounds: No stridor. No wheezing, rhonchi or rales.  Abdominal:     General: Abdomen is protuberant. Bowel sounds are normal.     Palpations: There is no hepatomegaly or splenomegaly.     Tenderness: There is no abdominal tenderness.  Musculoskeletal:     Right lower leg: Edema (trace/pitting) present.     Left lower leg: Edema (trace/pitting) present.  Lymphadenopathy:     Cervical: No cervical adenopathy.  Skin:    General: Skin is warm and dry.     Coloration: Skin is not pale.  Neurological:     General: No focal deficit present.     Mental Status:  She is alert.     Lab Results  Component Value Date   WBC 9.7 12/22/2018   HGB 12.3 12/22/2018   HCT 36.9 12/22/2018   PLT 396.0 12/22/2018   GLUCOSE 118 (H) 12/29/2018   CHOL 197 12/22/2018   TRIG 191.0 (H) 12/22/2018   HDL 39.10 12/22/2018   LDLCALC 120 (H) 12/22/2018   ALT 28 12/22/2018   AST 24 12/22/2018   NA 137 12/29/2018   K 3.5 12/29/2018   CL 101 12/29/2018   CREATININE 1.04 12/29/2018   BUN 17 12/29/2018   CO2 29 12/29/2018   TSH 2.89 12/22/2018   HGBA1C 6.1 12/22/2018    No results found.  Assessment & Plan:   Airyn was seen today for hypertension.  Diagnoses and all orders for this visit:  Essential hypertension- Her renal function has normalized.  Her blood pressure is adequately well controlled but she complains of weight gain and lower extremity edema.  I recommended that she take indapamide. -     indapamide (LOZOL) 1.25 MG tablet; Take 1 tablet (1.25 mg total) by mouth daily. -     Basic metabolic panel; Future  Bilateral leg edema -     indapamide (LOZOL) 1.25 MG tablet; Take 1 tablet (1.25 mg total) by mouth daily.   I am having Wylma H. Brune start on indapamide. I am also having her maintain her estradiol, esomeprazole, potassium chloride SA, Cholecalciferol, DULoxetine, metoprolol tartrate, and buPROPion.  Meds ordered this encounter  Medications  . indapamide (LOZOL) 1.25 MG tablet    Sig: Take 1 tablet (1.25 mg total) by mouth daily.    Dispense:  90 tablet    Refill:  1     Follow-up: Return in about 3 months (around 03/31/2019).  Scarlette Calico, MD

## 2018-12-29 NOTE — Patient Instructions (Signed)

## 2018-12-30 ENCOUNTER — Encounter: Payer: Self-pay | Admitting: Internal Medicine

## 2019-01-01 ENCOUNTER — Encounter: Payer: Self-pay | Admitting: Internal Medicine

## 2019-01-05 ENCOUNTER — Encounter: Payer: Self-pay | Admitting: Internal Medicine

## 2019-01-11 ENCOUNTER — Other Ambulatory Visit: Payer: Self-pay | Admitting: Internal Medicine

## 2019-02-26 ENCOUNTER — Telehealth: Payer: Self-pay

## 2019-02-26 NOTE — Telephone Encounter (Signed)
Key: [id number]  PA completed - determination came back that PA is not needed. Drug is covered by current benefit plan.   Pt is not taking current medication. Sent message to pt to disregard.

## 2019-04-23 ENCOUNTER — Ambulatory Visit (INDEPENDENT_AMBULATORY_CARE_PROVIDER_SITE_OTHER): Payer: Commercial Managed Care - PPO | Admitting: *Deleted

## 2019-04-23 ENCOUNTER — Other Ambulatory Visit: Payer: Self-pay

## 2019-04-23 DIAGNOSIS — Z23 Encounter for immunization: Secondary | ICD-10-CM | POA: Diagnosis not present

## 2019-04-25 ENCOUNTER — Other Ambulatory Visit: Payer: Self-pay | Admitting: Internal Medicine

## 2019-06-25 ENCOUNTER — Other Ambulatory Visit: Payer: Self-pay | Admitting: Internal Medicine

## 2019-06-25 DIAGNOSIS — F330 Major depressive disorder, recurrent, mild: Secondary | ICD-10-CM

## 2019-06-25 DIAGNOSIS — R600 Localized edema: Secondary | ICD-10-CM

## 2019-06-25 DIAGNOSIS — I10 Essential (primary) hypertension: Secondary | ICD-10-CM

## 2019-06-28 ENCOUNTER — Ambulatory Visit: Payer: Commercial Managed Care - PPO

## 2019-06-29 ENCOUNTER — Telehealth: Payer: Self-pay | Admitting: Internal Medicine

## 2019-06-29 DIAGNOSIS — I10 Essential (primary) hypertension: Secondary | ICD-10-CM

## 2019-06-29 DIAGNOSIS — R600 Localized edema: Secondary | ICD-10-CM

## 2019-06-29 MED ORDER — INDAPAMIDE 1.25 MG PO TABS: 1.2500 mg | ORAL_TABLET | Freq: Every day | ORAL | 0 refills | Status: DC

## 2019-06-29 NOTE — Telephone Encounter (Signed)
erx sent as requested.  

## 2019-06-29 NOTE — Telephone Encounter (Signed)
  Patient requesting short supply until next appt  1.Medication Requested:indapamide (LOZOL) 1.25 MG tablet  2. Pharmacy (Name, Street, City):[facility] [address], [place]  3. On Med List: yes  4. Last Visit with PCP: 12/29/18  5. Next visit date with PCP: 07/08/19   Agent: Please be advised that RX refills may take up to 3 business days. We ask that you follow-up with your pharmacy.

## 2019-06-30 LAB — HM MAMMOGRAPHY: HM Mammogram: NORMAL (ref 0–4)

## 2019-07-06 ENCOUNTER — Other Ambulatory Visit: Payer: Self-pay | Admitting: Internal Medicine

## 2019-07-06 DIAGNOSIS — F330 Major depressive disorder, recurrent, mild: Secondary | ICD-10-CM

## 2019-07-08 ENCOUNTER — Ambulatory Visit (INDEPENDENT_AMBULATORY_CARE_PROVIDER_SITE_OTHER): Payer: Commercial Managed Care - PPO | Admitting: Internal Medicine

## 2019-07-08 ENCOUNTER — Other Ambulatory Visit: Payer: Commercial Managed Care - PPO

## 2019-07-08 ENCOUNTER — Encounter: Payer: Self-pay | Admitting: Internal Medicine

## 2019-07-08 ENCOUNTER — Other Ambulatory Visit: Payer: Self-pay

## 2019-07-08 VITALS — BP 138/82 | HR 72 | Temp 98.4°F | Resp 16 | Ht 66.0 in | Wt 220.0 lb

## 2019-07-08 DIAGNOSIS — Z23 Encounter for immunization: Secondary | ICD-10-CM | POA: Diagnosis not present

## 2019-07-08 DIAGNOSIS — R739 Hyperglycemia, unspecified: Secondary | ICD-10-CM

## 2019-07-08 DIAGNOSIS — F330 Major depressive disorder, recurrent, mild: Secondary | ICD-10-CM

## 2019-07-08 DIAGNOSIS — R600 Localized edema: Secondary | ICD-10-CM

## 2019-07-08 DIAGNOSIS — I10 Essential (primary) hypertension: Secondary | ICD-10-CM

## 2019-07-08 LAB — BASIC METABOLIC PANEL
BUN: 16 mg/dL (ref 6–23)
CO2: 33 mEq/L — ABNORMAL HIGH (ref 19–32)
Calcium: 9.8 mg/dL (ref 8.4–10.5)
Chloride: 100 mEq/L (ref 96–112)
Creatinine, Ser: 0.92 mg/dL (ref 0.40–1.20)
GFR: 62.58 mL/min (ref >60.00)
Glucose, Bld: 114 mg/dL — ABNORMAL HIGH (ref 70–99)
Potassium: 3.7 mEq/L (ref 3.5–5.1)
Sodium: 139 mEq/L (ref 135–145)

## 2019-07-08 LAB — HEMOGLOBIN A1C: Hgb A1c MFr Bld: 5.9 % (ref 4.6–6.5)

## 2019-07-08 MED ORDER — INDAPAMIDE 1.25 MG PO TABS: 1.2500 mg | ORAL_TABLET | Freq: Every day | ORAL | 1 refills | Status: DC

## 2019-07-08 MED ORDER — METOPROLOL TARTRATE 25 MG PO TABS: ORAL_TABLET | ORAL | 1 refills | Status: DC

## 2019-07-08 MED ORDER — DULOXETINE HCL 60 MG PO CPEP: 60.0000 mg | ORAL_CAPSULE | Freq: Every day | ORAL | 1 refills | Status: DC

## 2019-07-08 MED ORDER — BUPROPION HCL ER (XL) 150 MG PO TB24: 150.0000 mg | ORAL_TABLET | Freq: Every day | ORAL | 1 refills | Status: DC

## 2019-07-08 NOTE — Progress Notes (Signed)
Subjective:  Patient ID: [name] [name], female    DOB: Jul 31, 1960  Age: 59 y.o. MRN: [id number]  CC: Hypertension  This visit occurred during the SARS-CoV-2 public health emergency.  Safety protocols were in place, including screening questions prior to the visit, additional usage of staff PPE, and extensive cleaning of exam room while observing appropriate contact time as indicated for disinfecting solutions.    HPI [name] presents for f/up - She tells me her blood pressure has been well controlled.  She is active and denies any recent episodes of chest pain, shortness of breath, dizziness, lightheadedness, edema, or fatigue.  For depression, she is doing well on the combination of bupropion and duloxetine.  Outpatient Medications Prior to Visit  Medication Sig Dispense Refill  . estradiol (ESTRACE) 0.5 MG tablet TK 1 T PO QD    . buPROPion (WELLBUTRIN XL) 150 MG 24 hr tablet TAKE 1 TABLET(150 MG) BY MOUTH DAILY 90 tablet 1  . DULoxetine (CYMBALTA) 60 MG capsule TAKE 1 CAPSULE DAILY 90 capsule 1  . esomeprazole (NEXIUM) 20 MG packet Take 20 mg by mouth daily before breakfast. 90 each 1  . indapamide (LOZOL) 1.25 MG tablet Take 1 tablet (1.25 mg total) by mouth daily. 30 tablet 0  . metoprolol tartrate (LOPRESSOR) 25 MG tablet TAKE 1 TABLET(25 MG) BY MOUTH TWICE DAILY 180 tablet 1  . Cholecalciferol 1.25 MG (50000 UT) capsule Take 1 capsule (50,000 Units total) by mouth once a week. 12 capsule 1  . potassium chloride SA (K-DUR,KLOR-CON) 20 MEQ tablet Take 1 tablet (20 mEq total) by mouth 3 (three) times daily. 90 tablet 3   No facility-administered medications prior to visit.    ROS Review of Systems  Constitutional: Negative for diaphoresis, fatigue and unexpected weight change.  HENT: Negative.   Eyes: Negative for visual disturbance.  Respiratory: Negative for cough, chest tightness, shortness of breath and wheezing.   Cardiovascular: Negative for chest pain, palpitations  and leg swelling.  Gastrointestinal: Negative for abdominal pain, constipation, diarrhea, nausea and vomiting.  Endocrine: Negative.  Negative for polyuria.  Genitourinary: Negative.  Negative for difficulty urinating and dysuria.  Musculoskeletal: Negative.  Negative for arthralgias and myalgias.  Skin: Negative.  Negative for color change and pallor.  Neurological: Negative.  Negative for dizziness, weakness and light-headedness.  Hematological: Negative for adenopathy. Does not bruise/bleed easily.  Psychiatric/Behavioral: Negative.     Objective:  BP 138/82 (BP Location: Left Arm, Patient Position: Sitting, Cuff Size: Large)   Pulse 72   Temp 98.4 F (36.9 C) (Oral)   Resp 16   Ht 5\' 6"  (1.676 m)   Wt 220 lb (99.8 kg)   LMP  (LMP Unknown)   SpO2 96%   BMI 35.51 kg/m   BP Readings from Last 3 Encounters:  07/08/19 138/82  12/29/18 126/82  12/22/18 100/64    Wt Readings from Last 3 Encounters:  07/08/19 220 lb (99.8 kg)  12/29/18 221 lb (100.2 kg)  12/22/18 216 lb (98 kg)    Physical Exam Constitutional:      Appearance: She is obese.  HENT:     Nose: Nose normal.     Mouth/Throat:     Mouth: Mucous membranes are moist.  Eyes:     General: No scleral icterus.    Conjunctiva/sclera: Conjunctivae normal.  Cardiovascular:     Rate and Rhythm: Normal rate and regular rhythm.     Heart sounds: No murmur.  Pulmonary:  Effort: Pulmonary effort is normal.     Breath sounds: No stridor. No wheezing, rhonchi or rales.  Abdominal:     General: Abdomen is protuberant. Bowel sounds are normal. There is no distension.     Palpations: Abdomen is soft. There is no hepatomegaly, splenomegaly or mass.     Tenderness: There is no abdominal tenderness.  Musculoskeletal:        General: Normal range of motion.     Cervical back: Neck supple.     Right lower leg: No edema.     Left lower leg: No edema.  Lymphadenopathy:     Cervical: No cervical adenopathy.  Skin:     General: Skin is warm and dry.  Neurological:     General: No focal deficit present.     Mental Status: She is alert.  Psychiatric:        Mood and Affect: Mood normal.        Behavior: Behavior normal.        Thought Content: Thought content normal.        Judgment: Judgment normal.     Lab Results  Component Value Date   WBC 9.7 12/22/2018   HGB 12.3 12/22/2018   HCT 36.9 12/22/2018   PLT 396.0 12/22/2018   GLUCOSE 114 (H) 07/08/2019   CHOL 197 12/22/2018   TRIG 191.0 (H) 12/22/2018   HDL 39.10 12/22/2018   LDLCALC 120 (H) 12/22/2018   ALT 28 12/22/2018   AST 24 12/22/2018   NA 139 07/08/2019   K 3.7 07/08/2019   CL 100 07/08/2019   CREATININE 0.92 07/08/2019   BUN 16 07/08/2019   CO2 33 (H) 07/08/2019   TSH 2.89 12/22/2018   HGBA1C 5.9 07/08/2019    No results found.  Assessment & Plan:   Shonnette was seen today for hypertension.  Diagnoses and all orders for this visit:  Need for Tdap vaccination  Essential hypertension- Her blood pressure is adequately well controlled. -     Basic metabolic panel; Future -     metoprolol tartrate (LOPRESSOR) 25 MG tablet; TAKE 1 TABLET(25 MG) BY MOUTH TWICE DAILY -     indapamide (LOZOL) 1.25 MG tablet; Take 1 tablet (1.25 mg total) by mouth daily.  Hyperglycemia- Her A1c is at 5.9%.  Medical therapy is not indicated.  She will continue to work on her lifestyle modifications. -     Basic metabolic panel; Future -     Hemoglobin A1c; Future  Bilateral leg edema- Improvement noted. -     indapamide (LOZOL) 1.25 MG tablet; Take 1 tablet (1.25 mg total) by mouth daily.  Mild episode of recurrent major depressive disorder (HCC) -     DULoxetine (CYMBALTA) 60 MG capsule; Take 1 capsule (60 mg total) by mouth daily. -     buPROPion (WELLBUTRIN XL) 150 MG 24 hr tablet; Take 1 tablet (150 mg total) by mouth daily.  Other orders -     Tdap vaccine greater than or equal to 7yo IM   I have discontinued Aliyana H. Wuest's  esomeprazole, potassium chloride SA, and Cholecalciferol. I have also changed her DULoxetine and buPROPion. Additionally, I am having her maintain her estradiol, metoprolol tartrate, and indapamide.  Meds ordered this encounter  Medications  . metoprolol tartrate (LOPRESSOR) 25 MG tablet    Sig: TAKE 1 TABLET(25 MG) BY MOUTH TWICE DAILY    Dispense:  180 tablet    Refill:  1  . DULoxetine (CYMBALTA) 60 MG capsule  Sig: Take 1 capsule (60 mg total) by mouth daily.    Dispense:  90 capsule    Refill:  1  . indapamide (LOZOL) 1.25 MG tablet    Sig: Take 1 tablet (1.25 mg total) by mouth daily.    Dispense:  90 tablet    Refill:  1  . buPROPion (WELLBUTRIN XL) 150 MG 24 hr tablet    Sig: Take 1 tablet (150 mg total) by mouth daily.    Dispense:  90 tablet    Refill:  1     Follow-up: Return in about 6 months (around 01/07/2020).  Scarlette Calico, MD

## 2019-07-08 NOTE — Patient Instructions (Signed)

## 2019-07-09 ENCOUNTER — Encounter: Payer: Self-pay | Admitting: Internal Medicine

## 2019-07-27 ENCOUNTER — Other Ambulatory Visit: Payer: Self-pay | Admitting: Internal Medicine

## 2019-07-27 DIAGNOSIS — R600 Localized edema: Secondary | ICD-10-CM

## 2019-07-27 DIAGNOSIS — I10 Essential (primary) hypertension: Secondary | ICD-10-CM

## 2019-10-20 ENCOUNTER — Encounter: Payer: Self-pay | Admitting: Internal Medicine

## 2019-10-21 ENCOUNTER — Encounter: Payer: Self-pay | Admitting: Internal Medicine

## 2019-10-21 ENCOUNTER — Other Ambulatory Visit: Payer: Self-pay

## 2019-10-21 ENCOUNTER — Ambulatory Visit (INDEPENDENT_AMBULATORY_CARE_PROVIDER_SITE_OTHER): Payer: Commercial Managed Care - PPO | Admitting: Internal Medicine

## 2019-10-21 VITALS — BP 140/92 | HR 72 | Temp 98.1°F | Resp 16 | Ht 66.0 in | Wt 218.0 lb

## 2019-10-21 DIAGNOSIS — I7381 Erythromelalgia: Secondary | ICD-10-CM

## 2019-10-21 DIAGNOSIS — R600 Localized edema: Secondary | ICD-10-CM

## 2019-10-21 NOTE — Patient Instructions (Signed)

## 2019-10-21 NOTE — Progress Notes (Signed)
Subjective:  Patient ID: [name] [name], female    DOB: 1960/11/09  Age: 59 y.o. MRN: [id number]  CC: Hypertension and Osteoarthritis  This visit occurred during the SARS-CoV-2 public health emergency.  Safety protocols were in place, including screening questions prior to the visit, additional usage of staff PPE, and extensive cleaning of exam room while observing appropriate contact time as indicated for disinfecting solutions.    HPI [name] [name] presents for f/up - She has multiple complaints regarding her lower extremities. She has had chronic right knee pain with instability. She recently saw orthopedics and there is concern she has a Baker's cyst. An MRI has been ordered but she has not done it yet. She also complains of intermittent episodes of lower extremity swelling. She is concerned she might have blood clots in her legs. She has also noticed over last few months that her toes, especially on the left side, intermittently turn blue/purple. There is no discomfort associated with this. She denies claudication.   Outpatient Medications Prior to Visit  Medication Sig Dispense Refill  . buPROPion (WELLBUTRIN XL) 150 MG 24 hr tablet Take 1 tablet (150 mg total) by mouth daily. 90 tablet 1  . DULoxetine (CYMBALTA) 60 MG capsule Take 1 capsule (60 mg total) by mouth daily. 90 capsule 1  . estradiol (ESTRACE) 0.5 MG tablet TK 1 T PO QD    . indapamide (LOZOL) 1.25 MG tablet TAKE 1 TABLET(1.25 MG) BY MOUTH DAILY 30 tablet 1  . metoprolol tartrate (LOPRESSOR) 25 MG tablet TAKE 1 TABLET(25 MG) BY MOUTH TWICE DAILY 180 tablet 1   No facility-administered medications prior to visit.    ROS Review of Systems  Constitutional: Negative.  Negative for appetite change, diaphoresis, fatigue and unexpected weight change.  HENT: Negative.   Eyes: Negative.   Respiratory: Negative for cough, chest tightness, shortness of breath and wheezing.   Cardiovascular: Positive for leg swelling.  Negative for chest pain and palpitations.  Gastrointestinal: Negative for abdominal pain, constipation, diarrhea, nausea and vomiting.  Endocrine: Negative.   Genitourinary: Negative for difficulty urinating, dysuria, hematuria and pelvic pain.  Musculoskeletal: Positive for arthralgias. Negative for joint swelling.  Skin: Negative for color change, pallor and rash.  Neurological: Negative.  Negative for dizziness, weakness, light-headedness and headaches.  Hematological: Negative for adenopathy. Does not bruise/bleed easily.  Psychiatric/Behavioral: Negative.     Objective:  BP (!) 140/92 (BP Location: Left Arm, Patient Position: Sitting, Cuff Size: Large)   Pulse 72   Temp 98.1 F (36.7 C) (Oral)   Resp 16   Ht 5\' 6"  (1.676 m)   Wt 218 lb (98.9 kg)   LMP  (LMP Unknown)   SpO2 97%   BMI 35.19 kg/m   BP Readings from Last 3 Encounters:  10/21/19 (!) 140/92  07/08/19 138/82  12/29/18 126/82    Wt Readings from Last 3 Encounters:  10/21/19 218 lb (98.9 kg)  07/08/19 220 lb (99.8 kg)  12/29/18 221 lb (100.2 kg)    Physical Exam Vitals reviewed.  HENT:     Nose: Nose normal.     Mouth/Throat:     Mouth: Mucous membranes are moist.  Eyes:     General: No scleral icterus.    Conjunctiva/sclera: Conjunctivae normal.  Cardiovascular:     Rate and Rhythm: Normal rate and regular rhythm.     Heart sounds: No murmur heard.   Pulmonary:     Effort: Pulmonary effort is normal.  Breath sounds: No stridor. No wheezing, rhonchi or rales.  Abdominal:     General: Abdomen is protuberant. Bowel sounds are normal. There is no distension.     Palpations: Abdomen is soft. There is no hepatomegaly or splenomegaly.  Musculoskeletal:        General: Normal range of motion.     Cervical back: Neck supple.     Right knee: Normal. No swelling or deformity.     Left knee: Normal. No swelling or deformity.     Right lower leg: Edema (trace) present.     Left lower leg: Edema (trace)  present.     Comments: During the exam today the toes on her left foot intermittently and spontaneously turned a bluish-purple color. She sends photos of the same thing.  Skin:    General: Skin is warm and dry.     Findings: Erythema present.  Neurological:     General: No focal deficit present.     Mental Status: She is alert.     Lab Results  Component Value Date   WBC 9.7 12/22/2018   HGB 12.3 12/22/2018   HCT 36.9 12/22/2018   PLT 396.0 12/22/2018   GLUCOSE 114 (H) 07/08/2019   CHOL 197 12/22/2018   TRIG 191.0 (H) 12/22/2018   HDL 39.10 12/22/2018   LDLCALC 120 (H) 12/22/2018   ALT 28 12/22/2018   AST 24 12/22/2018   NA 139 07/08/2019   K 3.7 07/08/2019   CL 100 07/08/2019   CREATININE 0.92 07/08/2019   BUN 16 07/08/2019   CO2 33 (H) 07/08/2019   TSH 2.89 12/22/2018   HGBA1C 5.9 07/08/2019    No results found.  Assessment & Plan:   [name] [name] was seen today for hypertension and osteoarthritis.  Diagnoses and all orders for this visit:  Bilateral leg edema- She will undergo an ultrasound of her lower extremities to screen for deep venous thrombosis. And Baker's cysts. -     VAS [place] LOWER EXTREMITY VENOUS (DVT); Future  Erythromelalgia ([place])- I think this explains the discoloration. I gave her information about this and told her that if it bothers her she should apply cold compresses.   I am having [name] [name] maintain her estradiol, metoprolol tartrate, DULoxetine, buPROPion, and indapamide.  No orders of the defined types were placed in this encounter.    Follow-up: Return in about 3 months (around 01/21/2020).  [name] Calico, MD

## 2019-10-24 DIAGNOSIS — I7381 Erythromelalgia: Secondary | ICD-10-CM | POA: Insufficient documentation

## 2019-10-25 ENCOUNTER — Encounter (HOSPITAL_COMMUNITY): Payer: Commercial Managed Care - PPO

## 2019-10-26 ENCOUNTER — Other Ambulatory Visit: Payer: Self-pay

## 2019-10-26 ENCOUNTER — Ambulatory Visit (HOSPITAL_COMMUNITY)
Admission: RE | Admit: 2019-10-26 | Discharge: 2019-10-26 | Disposition: A | Discharge status: Home / Self Care | Payer: Commercial Managed Care - PPO | Source: Ambulatory Visit | Attending: Cardiovascular Disease | Admitting: Cardiovascular Disease

## 2019-10-26 DIAGNOSIS — R600 Localized edema: Secondary | ICD-10-CM | POA: Diagnosis present

## 2019-11-13 ENCOUNTER — Encounter: Payer: Self-pay | Admitting: Internal Medicine

## 2019-11-16 ENCOUNTER — Other Ambulatory Visit: Payer: Self-pay | Admitting: Internal Medicine

## 2019-11-16 DIAGNOSIS — B769 Hookworm disease, unspecified: Secondary | ICD-10-CM | POA: Insufficient documentation

## 2019-12-17 ENCOUNTER — Other Ambulatory Visit: Payer: Self-pay | Admitting: Internal Medicine

## 2019-12-17 DIAGNOSIS — R600 Localized edema: Secondary | ICD-10-CM

## 2019-12-17 DIAGNOSIS — F330 Major depressive disorder, recurrent, mild: Secondary | ICD-10-CM

## 2019-12-17 DIAGNOSIS — I10 Essential (primary) hypertension: Secondary | ICD-10-CM

## 2019-12-19 ENCOUNTER — Other Ambulatory Visit: Payer: Self-pay | Admitting: Internal Medicine

## 2019-12-19 DIAGNOSIS — I10 Essential (primary) hypertension: Secondary | ICD-10-CM

## 2019-12-19 MED ORDER — METOPROLOL TARTRATE 25 MG PO TABS: ORAL_TABLET | ORAL | 1 refills | Status: DC

## 2019-12-23 ENCOUNTER — Other Ambulatory Visit: Payer: Self-pay | Admitting: Internal Medicine

## 2019-12-23 DIAGNOSIS — I10 Essential (primary) hypertension: Secondary | ICD-10-CM

## 2020-01-04 ENCOUNTER — Other Ambulatory Visit: Payer: Self-pay | Admitting: Internal Medicine

## 2020-01-04 DIAGNOSIS — F330 Major depressive disorder, recurrent, mild: Secondary | ICD-10-CM

## 2020-05-26 ENCOUNTER — Encounter: Payer: Self-pay | Admitting: Internal Medicine

## 2020-06-14 ENCOUNTER — Other Ambulatory Visit: Payer: Self-pay | Admitting: Internal Medicine

## 2020-06-14 DIAGNOSIS — F330 Major depressive disorder, recurrent, mild: Secondary | ICD-10-CM

## 2020-06-14 DIAGNOSIS — R600 Localized edema: Secondary | ICD-10-CM

## 2020-06-14 DIAGNOSIS — I10 Essential (primary) hypertension: Secondary | ICD-10-CM

## 2020-06-29 LAB — HM MAMMOGRAPHY: HM Mammogram: NORMAL (ref 0–4)

## 2020-06-29 LAB — HM PAP SMEAR

## 2020-07-03 ENCOUNTER — Other Ambulatory Visit: Payer: Self-pay | Admitting: Internal Medicine

## 2020-07-03 DIAGNOSIS — F330 Major depressive disorder, recurrent, mild: Secondary | ICD-10-CM

## 2020-07-14 LAB — HM COLONOSCOPY

## 2020-07-26 ENCOUNTER — Telehealth: Payer: Self-pay | Admitting: Internal Medicine

## 2020-07-26 DIAGNOSIS — R600 Localized edema: Secondary | ICD-10-CM

## 2020-07-26 DIAGNOSIS — I10 Essential (primary) hypertension: Secondary | ICD-10-CM

## 2020-07-26 NOTE — Telephone Encounter (Signed)
Called pt, LVM.   Short supply denied

## 2020-07-26 NOTE — Telephone Encounter (Signed)
Denied.  Per last OV on 10/21/19 AVS stated: Follow-up: Return in about 3 months (around 01/21/2020).   Pt requested a short supply until then. I will check with PCP.

## 2020-07-26 NOTE — Telephone Encounter (Signed)
    1.Medication Requested: indapamide (LOZOL) 1.25 MG tablet  2. Pharmacy (Name, Street, City):[facility] [place], [place]  3. On Med List: yes   4. Last Visit with PCP: 10/21/19  5. Next visit date with PCP: 08/16/20   Agent: Please be advised that RX refills may take up to 3 business days. We ask that you follow-up with your pharmacy.

## 2020-08-02 ENCOUNTER — Other Ambulatory Visit: Payer: Self-pay | Admitting: Internal Medicine

## 2020-08-02 DIAGNOSIS — I10 Essential (primary) hypertension: Secondary | ICD-10-CM

## 2020-08-02 DIAGNOSIS — R600 Localized edema: Secondary | ICD-10-CM

## 2020-08-02 MED ORDER — INDAPAMIDE 1.25 MG PO TABS: 1.2500 mg | ORAL_TABLET | Freq: Every day | ORAL | 0 refills | Status: DC

## 2020-08-02 NOTE — Telephone Encounter (Signed)
Patient calling back, wondering what she is supposed to do in terms of medicine because its been denied. Patient wants someone to give her a call back.

## 2020-08-02 NOTE — Telephone Encounter (Signed)
Pt has been informed that 30 day supply was sent in.   She also voiced that she did not appreciate my attitude when I spoke with her last week and that it is none of my business why she has not been in to see PCP. I replied that I did not have an attitude and that I was following guidelines per AVS.   She stated that she would keep her appointment for 6/1 and said told me "If I were you I'd better watch it".

## 2020-08-02 NOTE — Addendum Note (Signed)
Addended by: Hinda Kehr on: 08/02/2020 08:36 AM   Modules accepted: Orders

## 2020-08-15 ENCOUNTER — Telehealth: Payer: Self-pay | Admitting: Internal Medicine

## 2020-08-15 NOTE — Telephone Encounter (Signed)
Patient tested positive for COVID on 08/14/2020 so she was unable make her appointment on 08/16/2020. Rescheduled appointment is not until 09/19/2020. Patient stated that the 30 day supply will not last til that appointment. Requesting another medication refill to hold her over til 09/19/2020.   Please advise.   Preferred Pharmacy:   [facility] [place], [place] Phone:  [phone]  Fax:  [phone]

## 2020-08-16 ENCOUNTER — Ambulatory Visit: Payer: Commercial Managed Care - PPO | Admitting: Internal Medicine

## 2020-08-16 ENCOUNTER — Telehealth: Payer: Self-pay | Admitting: Internal Medicine

## 2020-08-16 ENCOUNTER — Other Ambulatory Visit: Payer: Self-pay | Admitting: Internal Medicine

## 2020-08-16 DIAGNOSIS — J208 Acute bronchitis due to other specified organisms: Secondary | ICD-10-CM

## 2020-08-16 DIAGNOSIS — U071 COVID-19: Secondary | ICD-10-CM | POA: Insufficient documentation

## 2020-08-16 MED ORDER — PAXLOVID 20 X 150 MG & 10 X 100MG PO TBPK: 3.0000 | ORAL_TABLET | Freq: Two times a day (BID) | ORAL | 0 refills | Status: AC

## 2020-08-16 NOTE — Telephone Encounter (Signed)
Patients husband called and said that the patient test positive for covid 19 on 08/14/20. He said that she has a runny nose, cough, congestion, headache and body aches and fatigue. He was wondering if Dr. Ronnald Ramp could send the antiviral medication to [facility], [address] AT [place]. Please advise

## 2020-08-18 ENCOUNTER — Other Ambulatory Visit: Payer: Self-pay | Admitting: Internal Medicine

## 2020-08-18 DIAGNOSIS — R600 Localized edema: Secondary | ICD-10-CM

## 2020-08-18 DIAGNOSIS — I10 Essential (primary) hypertension: Secondary | ICD-10-CM

## 2020-08-18 MED ORDER — INDAPAMIDE 1.25 MG PO TABS: 1.2500 mg | ORAL_TABLET | Freq: Every day | ORAL | 0 refills | Status: DC

## 2020-08-18 MED ORDER — METOPROLOL TARTRATE 25 MG PO TABS: 25.0000 mg | ORAL_TABLET | Freq: Two times a day (BID) | ORAL | 0 refills | Status: DC

## 2020-08-23 ENCOUNTER — Telehealth: Payer: Commercial Managed Care - PPO | Admitting: Nurse Practitioner

## 2020-08-23 ENCOUNTER — Encounter: Payer: Self-pay | Admitting: Internal Medicine

## 2020-08-23 DIAGNOSIS — R058 Other specified cough: Secondary | ICD-10-CM | POA: Diagnosis not present

## 2020-08-23 MED ORDER — PREDNISONE 10 MG (21) PO TBPK: ORAL_TABLET | ORAL | 0 refills | Status: DC

## 2020-08-23 NOTE — Progress Notes (Signed)
We are sorry that you are not feeling well.  Here is how we plan to help!  Based on your presentation I believe you most likely have A cough due to a virus.  This is called viral bronchitis and is best treated by rest, plenty of fluids and control of the cough.  You may use Ibuprofen or Tylenol as directed to help your symptoms.     In addition you may use A non-prescription cough medication called Mucinex DM: take 2 tablets every 12 hours.  Prednisone 10 mg daily for 6 days (see taper instructions below)  Directions for 6 day taper: Day 1: 2 tablets before breakfast, 1 after both lunch & dinner and 2 at bedtime Day 2: 1 tab before breakfast, 1 after both lunch & dinner and 2 at bedtime Day 3: 1 tab at each meal & 1 at bedtime Day 4: 1 tab at breakfast, 1 at lunch, 1 at bedtime Day 5: 1 tab at breakfast & 1 tab at bedtime Day 6: 1 tab at breakfast   From your responses in the eVisit questionnaire you describe inflammation in the upper respiratory tract which is causing a significant cough.  This is commonly called Bronchitis and has four common causes:    Allergies  Viral Infections  Acid Reflux  Bacterial Infection Allergies, viruses and acid reflux are treated by controlling symptoms or eliminating the cause. An example might be a cough caused by taking certain blood pressure medications. You stop the cough by changing the medication. Another example might be a cough caused by acid reflux. Controlling the reflux helps control the cough.  USE OF BRONCHODILATOR ("RESCUE") INHALERS: There is a risk from using your bronchodilator too frequently.  The risk is that over-reliance on a medication which only relaxes the muscles surrounding the breathing tubes can reduce the effectiveness of medications prescribed to reduce swelling and congestion of the tubes themselves.  Although you feel brief relief from the bronchodilator inhaler, your asthma may actually be worsening with the tubes  becoming more swollen and filled with mucus.  This can delay other crucial treatments, such as oral steroid medications. If you need to use a bronchodilator inhaler daily, several times per day, you should discuss this with your provider.  There are probably better treatments that could be used to keep your asthma under control.     HOME CARE . Only take medications as instructed by your medical team. . Complete the entire course of an antibiotic. . Drink plenty of fluids and get plenty of rest. . Avoid close contacts especially the very young and the elderly . Cover your mouth if you cough or cough into your sleeve. . Always remember to wash your hands . A steam or ultrasonic humidifier can help congestion.   GET HELP RIGHT AWAY IF: . You develop worsening fever. . You become short of breath . You cough up blood. . Your symptoms persist after you have completed your treatment plan MAKE SURE YOU   Understand these instructions.  Will watch your condition.  Will get help right away if you are not doing well or get worse.  Your e-visit answers were reviewed by a board certified advanced clinical practitioner to complete your personal care plan.  Depending on the condition, your plan could have included both over the counter or prescription medications. If there is a problem please reply  once you have received a response from your provider. Your safety is important to [place].  If you  have drug allergies check your prescription carefully.    You can use MyChart to ask questions about today's visit, request a non-urgent call back, or ask for a work or school excuse for 24 hours related to this e-Visit. If it has been greater than 24 hours you will need to follow up with your provider, or enter a new e-Visit to address those concerns. You will get an e-mail in the next two days asking about your experience.  I hope that your e-visit has been valuable and will speed your recovery. Thank you for  using e-visits.  5-10 minutes spent reviewing and documenting in chart.

## 2020-08-24 ENCOUNTER — Other Ambulatory Visit: Payer: Self-pay | Admitting: Internal Medicine

## 2020-08-24 DIAGNOSIS — I10 Essential (primary) hypertension: Secondary | ICD-10-CM

## 2020-08-24 DIAGNOSIS — R600 Localized edema: Secondary | ICD-10-CM

## 2020-08-27 ENCOUNTER — Other Ambulatory Visit: Payer: Self-pay | Admitting: Internal Medicine

## 2020-08-27 DIAGNOSIS — F330 Major depressive disorder, recurrent, mild: Secondary | ICD-10-CM

## 2020-09-19 ENCOUNTER — Other Ambulatory Visit: Payer: Self-pay

## 2020-09-19 ENCOUNTER — Ambulatory Visit (INDEPENDENT_AMBULATORY_CARE_PROVIDER_SITE_OTHER): Payer: Commercial Managed Care - PPO | Admitting: Internal Medicine

## 2020-09-19 ENCOUNTER — Encounter: Payer: Self-pay | Admitting: Internal Medicine

## 2020-09-19 VITALS — BP 136/76 | HR 71 | Temp 98.3°F | Resp 18 | Ht 66.0 in | Wt 215.6 lb

## 2020-09-19 DIAGNOSIS — E118 Type 2 diabetes mellitus with unspecified complications: Secondary | ICD-10-CM

## 2020-09-19 DIAGNOSIS — R0600 Dyspnea, unspecified: Secondary | ICD-10-CM | POA: Diagnosis not present

## 2020-09-19 DIAGNOSIS — R0609 Other forms of dyspnea: Secondary | ICD-10-CM | POA: Insufficient documentation

## 2020-09-19 DIAGNOSIS — E669 Obesity, unspecified: Secondary | ICD-10-CM

## 2020-09-19 DIAGNOSIS — E559 Vitamin D deficiency, unspecified: Secondary | ICD-10-CM

## 2020-09-19 DIAGNOSIS — D539 Nutritional anemia, unspecified: Secondary | ICD-10-CM

## 2020-09-19 DIAGNOSIS — F330 Major depressive disorder, recurrent, mild: Secondary | ICD-10-CM | POA: Diagnosis not present

## 2020-09-19 DIAGNOSIS — D509 Iron deficiency anemia, unspecified: Secondary | ICD-10-CM

## 2020-09-19 DIAGNOSIS — D510 Vitamin B12 deficiency anemia due to intrinsic factor deficiency: Secondary | ICD-10-CM | POA: Insufficient documentation

## 2020-09-19 DIAGNOSIS — Z0000 Encounter for general adult medical examination without abnormal findings: Secondary | ICD-10-CM

## 2020-09-19 DIAGNOSIS — E785 Hyperlipidemia, unspecified: Secondary | ICD-10-CM | POA: Insufficient documentation

## 2020-09-19 DIAGNOSIS — R739 Hyperglycemia, unspecified: Secondary | ICD-10-CM

## 2020-09-19 DIAGNOSIS — R059 Cough, unspecified: Secondary | ICD-10-CM

## 2020-09-19 DIAGNOSIS — R600 Localized edema: Secondary | ICD-10-CM

## 2020-09-19 DIAGNOSIS — E66811 Obesity, class 1: Secondary | ICD-10-CM | POA: Insufficient documentation

## 2020-09-19 DIAGNOSIS — I10 Essential (primary) hypertension: Secondary | ICD-10-CM

## 2020-09-19 LAB — LIPID PANEL
Cholesterol: 175 mg/dL (ref 0–200)
HDL: 29.6 mg/dL — ABNORMAL LOW (ref >39.00)
LDL Cholesterol: 123 mg/dL — ABNORMAL HIGH (ref 0–99)
NonHDL: 144.95
Total CHOL/HDL Ratio: 6
Triglycerides: 111 mg/dL (ref 0.0–149.0)
VLDL: 22.2 mg/dL (ref 0.0–40.0)

## 2020-09-19 LAB — BASIC METABOLIC PANEL
BUN: 14 mg/dL (ref 6–23)
CO2: 32 mEq/L (ref 19–32)
Calcium: 9.7 mg/dL (ref 8.4–10.5)
Chloride: 101 mEq/L (ref 96–112)
Creatinine, Ser: 0.8 mg/dL (ref 0.40–1.20)
GFR: 80.46 mL/min (ref >60.00)
Glucose, Bld: 140 mg/dL — ABNORMAL HIGH (ref 70–99)
Potassium: 3.8 mEq/L (ref 3.5–5.1)
Sodium: 139 mEq/L (ref 135–145)

## 2020-09-19 LAB — CBC WITH DIFFERENTIAL/PLATELET
Basophils Absolute: 0.1 10*3/uL (ref 0.0–0.1)
Basophils Relative: 0.9 % (ref 0.0–3.0)
Eosinophils Absolute: 0.2 10*3/uL (ref 0.0–0.7)
Eosinophils Relative: 3.2 % (ref 0.0–5.0)
HCT: 34 % — ABNORMAL LOW (ref 36.0–46.0)
Hemoglobin: 10.5 g/dL — ABNORMAL LOW (ref 12.0–15.0)
Lymphocytes Relative: 27.1 % (ref 12.0–46.0)
Lymphs Abs: 2.1 10*3/uL (ref 0.7–4.0)
MCHC: 30.8 g/dL (ref 30.0–36.0)
MCV: 64.5 fl — ABNORMAL LOW (ref 78.0–100.0)
Monocytes Absolute: 1 10*3/uL (ref 0.1–1.0)
Monocytes Relative: 12.2 % — ABNORMAL HIGH (ref 3.0–12.0)
Neutro Abs: 4.4 10*3/uL (ref 1.4–7.7)
Neutrophils Relative %: 56.6 % (ref 43.0–77.0)
Platelets: 438 10*3/uL — ABNORMAL HIGH (ref 150.0–400.0)
RBC: 5.28 Mil/uL — ABNORMAL HIGH (ref 3.87–5.11)
RDW: 19 % — ABNORMAL HIGH (ref 11.5–15.5)
WBC: 7.8 10*3/uL (ref 4.0–10.5)

## 2020-09-19 LAB — TSH: TSH: 2.98 u[IU]/mL (ref 0.35–5.50)

## 2020-09-19 LAB — VITAMIN D 25 HYDROXY (VIT D DEFICIENCY, FRACTURES): VITD: 28.2 ng/mL — ABNORMAL LOW (ref 30.00–100.00)

## 2020-09-19 LAB — TROPONIN I (HIGH SENSITIVITY): High Sens Troponin I: 5 ng/L (ref 2–17)

## 2020-09-19 LAB — HEPATIC FUNCTION PANEL
ALT: 29 U/L (ref 0–35)
AST: 24 U/L (ref 0–37)
Albumin: 4 g/dL (ref 3.5–5.2)
Alkaline Phosphatase: 56 U/L (ref 39–117)
Bilirubin, Direct: 0.1 mg/dL (ref 0.0–0.3)
Total Bilirubin: 0.4 mg/dL (ref 0.2–1.2)
Total Protein: 7.1 g/dL (ref 6.0–8.3)

## 2020-09-19 LAB — IRON: Iron: 19 ug/dL — ABNORMAL LOW (ref 42–145)

## 2020-09-19 LAB — FERRITIN: Ferritin: 6 ng/mL — ABNORMAL LOW (ref 10.0–291.0)

## 2020-09-19 LAB — VITAMIN B12: Vitamin B-12: 186 pg/mL — ABNORMAL LOW (ref 211–911)

## 2020-09-19 LAB — HEMOGLOBIN A1C: Hgb A1c MFr Bld: 6.5 % (ref 4.6–6.5)

## 2020-09-19 LAB — BRAIN NATRIURETIC PEPTIDE: Pro B Natriuretic peptide (BNP): 13 pg/mL (ref 0.0–100.0)

## 2020-09-19 LAB — FOLATE: Folate: 12.9 ng/mL (ref >5.9)

## 2020-09-19 MED ORDER — PITAVASTATIN CALCIUM 2 MG PO TABS: 2.0000 mg | ORAL_TABLET | Freq: Every day | ORAL | 1 refills | Status: DC

## 2020-09-19 MED ORDER — ACCRUFER 30 MG PO CAPS
1.0000 | ORAL_CAPSULE | Freq: Two times a day (BID) | ORAL | 1 refills | Status: DC
Start: 2020-09-19 — End: 2020-10-04

## 2020-09-19 NOTE — Patient Instructions (Signed)
Health Maintenance, Female Adopting a healthy lifestyle and getting preventive care are important in promoting health and wellness. Ask your health care provider about: The right schedule for you to have regular tests and exams. Things you can do on your own to prevent diseases and keep yourself healthy. What should I know about diet, weight, and exercise? Eat a healthy diet  Eat a diet that includes plenty of vegetables, fruits, low-fat dairy products, and lean protein. Do not eat a lot of foods that are high in solid fats, added sugars, or sodium.  Maintain a healthy weight Body mass index (BMI) is used to identify weight problems. It estimates body fat based on height and weight. Your health care provider can help determineyour BMI and help you achieve or maintain a healthy weight. Get regular exercise Get regular exercise. This is one of the most important things you can do for your health. Most adults should: Exercise for at least 150 minutes each week. The exercise should increase your heart rate and make you sweat (moderate-intensity exercise). Do strengthening exercises at least twice a week. This is in addition to the moderate-intensity exercise. Spend less time sitting. Even light physical activity can be beneficial. Watch cholesterol and blood lipids Have your blood tested for lipids and cholesterol at 60 years of age, then havethis test every 5 years. Have your cholesterol levels checked more often if: Your lipid or cholesterol levels are high. You are older than 60 years of age. You are at high risk for heart disease. What should I know about cancer screening? Depending on your health history and family history, you may need to have cancer screening at various ages. This may include screening for: Breast cancer. Cervical cancer. Colorectal cancer. Skin cancer. Lung cancer. What should I know about heart disease, diabetes, and high blood pressure? Blood pressure and heart  disease High blood pressure causes heart disease and increases the risk of stroke. This is more likely to develop in people who have high blood pressure readings, are of African descent, or are overweight. Have your blood pressure checked: Every 3-5 years if you are 18-39 years of age. Every year if you are 40 years old or older. Diabetes Have regular diabetes screenings. This checks your fasting blood sugar level. Have the screening done: Once every three years after age 40 if you are at a normal weight and have a low risk for diabetes. More often and at a younger age if you are overweight or have a high risk for diabetes. What should I know about preventing infection? Hepatitis B If you have a higher risk for hepatitis B, you should be screened for this virus. Talk with your health care provider to find out if you are at risk forhepatitis B infection. Hepatitis C Testing is recommended for: Everyone born from 1945 through 1965. Anyone with known risk factors for hepatitis C. Sexually transmitted infections (STIs) Get screened for STIs, including gonorrhea and chlamydia, if: You are sexually active and are younger than 60 years of age. You are older than 60 years of age and your health care provider tells you that you are at risk for this type of infection. Your sexual activity has changed since you were last screened, and you are at increased risk for chlamydia or gonorrhea. Ask your health care provider if you are at risk. Ask your health care provider about whether you are at high risk for HIV. Your health care provider may recommend a prescription medicine to help   prevent HIV infection. If you choose to take medicine to prevent HIV, you should first get tested for HIV. You should then be tested every 3 months for as long as you are taking the medicine. Pregnancy If you are about to stop having your period (premenopausal) and you may become pregnant, seek counseling before you get  pregnant. Take 400 to 800 micrograms (mcg) of folic acid every day if you become pregnant. Ask for birth control (contraception) if you want to prevent pregnancy. Osteoporosis and menopause Osteoporosis is a disease in which the bones lose minerals and strength with aging. This can result in bone fractures. If you are 65 years old or older, or if you are at risk for osteoporosis and fractures, ask your health care provider if you should: Be screened for bone loss. Take a calcium or vitamin D supplement to lower your risk of fractures. Be given hormone replacement therapy (HRT) to treat symptoms of menopause. Follow these instructions at home: Lifestyle Do not use any products that contain nicotine or tobacco, such as cigarettes, e-cigarettes, and chewing tobacco. If you need help quitting, ask your health care provider. Do not use street drugs. Do not share needles. Ask your health care provider for help if you need support or information about quitting drugs. Alcohol use Do not drink alcohol if: Your health care provider tells you not to drink. You are pregnant, may be pregnant, or are planning to become pregnant. If you drink alcohol: Limit how much you use to 0-1 drink a day. Limit intake if you are breastfeeding. Be aware of how much alcohol is in your drink. In the [place] one drink equals one 12 oz bottle of beer (355 mL), one 5 oz glass of wine (148 mL), or one 1 oz glass of hard liquor (44 mL). General instructions Schedule regular health, dental, and eye exams. Stay current with your vaccines. Tell your health care provider if: You often feel depressed. You have ever been abused or do not feel safe at home. Summary Adopting a healthy lifestyle and getting preventive care are important in promoting health and wellness. Follow your health care provider's instructions about healthy diet, exercising, and getting tested or screened for diseases. Follow your health care provider's  instructions on monitoring your cholesterol and blood pressure. This information is not intended to replace advice given to you by your health care provider. Make sure you discuss any questions you have with your healthcare provider. Document Revised: 02/25/2018 Document Reviewed: 02/25/2018 Elsevier Patient Education  2022 [employer] Inc[employer]  

## 2020-09-19 NOTE — Progress Notes (Addendum)
Subjective:  Patient ID: [name] [name], female    DOB: [birth year]  Age: 60 y.o. MRN: [id number]  CC: Annual Exam, Hypertension, Anemia, Diabetes, Hyperlipidemia, and Depression  This visit occurred during the SARS-CoV-2 public health emergency.  Safety protocols were in place, including screening questions prior to the visit, additional usage of staff PPE, and extensive cleaning of exam room while observing appropriate contact time as indicated for disinfecting solutions.    HPI [name] [name] presents for a CPX and f/up -   She complains of a 73-month history of dyspnea on exertion.  She was recently seen elsewhere and was told that she is anemic.  She complains of pica, fatigue, weight gain, dizziness, dry skin, and chronic low back pain.  She recently saw a gastroenterologist and was told that there are no signs of intestinal bleeding.  She had a COVID-19 infection about a month or 2 ago and complains of persistent nonproductive cough and postnasal drip.  Outpatient Medications Prior to Visit  Medication Sig Dispense Refill   buPROPion (WELLBUTRIN XL) 150 MG 24 hr tablet TAKE 1 TABLET(150 MG) BY MOUTH DAILY 90 tablet 1   DULoxetine (CYMBALTA) 60 MG capsule TAKE 1 CAPSULE DAILY 90 capsule 0   estrogens-methylTEST (ESTRATEST) 1.25-2.5 MG tablet Take 1 tablet by mouth daily.     metoprolol tartrate (LOPRESSOR) 25 MG tablet TAKE 1 TABLET(25 MG) BY MOUTH TWICE DAILY 180 tablet 0   indapamide (LOZOL) 1.25 MG tablet TAKE 1 TABLET(1.25 MG) BY MOUTH DAILY 90 tablet 0   estradiol (ESTRACE) 0.5 MG tablet TK 1 T PO QD     predniSONE (STERAPRED UNI-PAK 21 TAB) 10 MG (21) TBPK tablet As directed x 6 days 21 tablet 0   No facility-administered medications prior to visit.    ROS Review of Systems  Constitutional:  Positive for fatigue and unexpected weight change (wt gain).  HENT:  Negative for sinus pressure and sore throat.   Eyes: Negative.   Respiratory:  Positive for cough and shortness of  breath. Negative for chest tightness and wheezing.   Cardiovascular:  Negative for chest pain, palpitations and leg swelling.  Gastrointestinal:  Negative for abdominal pain, blood in stool, constipation, diarrhea, nausea and vomiting.  Endocrine: Positive for cold intolerance and heat intolerance.  Genitourinary: Negative.  Negative for difficulty urinating and dysuria.  Musculoskeletal:  Positive for back pain. Negative for arthralgias and myalgias.  Skin:  Negative for color change and rash.  Neurological:  Positive for dizziness. Negative for syncope, speech difficulty, weakness and light-headedness.  Hematological:  Negative for adenopathy. Does not bruise/bleed easily.  Psychiatric/Behavioral:         She complains of cognitive decline   Objective:  BP 136/76 (BP Location: Right Arm, Patient Position: Sitting, Cuff Size: Normal)   Pulse 71   Temp 98.3 F (36.8 C) (Oral)   Resp 18   Ht 5\' 6"  (1.676 m)   Wt 215 lb 9.6 oz (97.8 kg)   LMP  (LMP Unknown)   SpO2 97%   BMI 34.80 kg/m   BP Readings from Last 3 Encounters:  09/19/20 136/76  10/21/19 (!) 140/92  07/08/19 138/82    Wt Readings from Last 3 Encounters:  09/19/20 215 lb 9.6 oz (97.8 kg)  10/21/19 218 lb (98.9 kg)  07/08/19 220 lb (99.8 kg)    Physical Exam Constitutional:      General: She is not in acute distress.    Appearance: She is obese. She is ill-appearing.  She is not toxic-appearing or diaphoretic.  HENT:     Nose: Nose normal.     Mouth/Throat:     Mouth: Mucous membranes are moist.  Eyes:     General: No scleral icterus.    Conjunctiva/sclera: Conjunctivae normal.  Cardiovascular:     Rate and Rhythm: Normal rate and regular rhythm.     Pulses: Normal pulses.     Heart sounds: No murmur heard.   No gallop.     Comments: EKG- NSR, 69 bpm Normal EKG Pulmonary:     Effort: Pulmonary effort is normal.     Breath sounds: No stridor. No wheezing, rhonchi or rales.  Abdominal:     General:  Abdomen is protuberant. Bowel sounds are normal. There is no distension.     Palpations: Abdomen is soft. There is no hepatomegaly, splenomegaly or mass.     Tenderness: There is no abdominal tenderness.  Musculoskeletal:     Cervical back: Neck supple.     Right lower leg: No edema.     Left lower leg: No edema.  Lymphadenopathy:     Cervical: No cervical adenopathy.  Skin:    Coloration: Skin is pale. Skin is not jaundiced.  Neurological:     General: No focal deficit present.     Mental Status: She is alert and oriented to person, place, and time. Mental status is at baseline.  Psychiatric:        Mood and Affect: Mood normal.        Behavior: Behavior normal.    Lab Results  Component Value Date   WBC 7.8 09/19/2020   HGB 10.5 (L) 09/19/2020   HCT 34.0 (L) 09/19/2020   PLT 438.0 (H) 09/19/2020   GLUCOSE 140 (H) 09/19/2020   CHOL 175 09/19/2020   TRIG 111.0 09/19/2020   HDL 29.60 (L) 09/19/2020   LDLCALC 123 (H) 09/19/2020   ALT 29 09/19/2020   AST 24 09/19/2020   NA 139 09/19/2020   K 3.8 09/19/2020   CL 101 09/19/2020   CREATININE 0.80 09/19/2020   BUN 14 09/19/2020   CO2 32 09/19/2020   TSH 3.54 09/19/2020   TSH 2.98 09/19/2020   HGBA1C 6.5 09/19/2020    VAS [place] LOWER EXTREMITY VENOUS (DVT)  Result Date: 10/26/2019  Lower Venous DVTStudy Indications: Pain. Other Indications: Patient complains of discomfort behind the right knee and                    intermittent swelling in the left calf for several months.                    She denies any shortness of breath. Risk Factors: None identified. Performing Technologist: Wilkie Aye RVT  Examination Guidelines: A complete evaluation includes B-mode imaging, spectral Doppler, color Doppler, and power Doppler as needed of all accessible portions of each vessel. Bilateral testing is considered an integral part of a complete examination. Limited examinations for reoccurring indications may be performed as noted. The reflux  portion of the exam is performed with the patient in reverse Trendelenburg.  +---------+---------------+---------+-----------+----------+--------------+ RIGHT    CompressibilityPhasicitySpontaneityPropertiesThrombus Aging +---------+---------------+---------+-----------+----------+--------------+ CFV      Full           Yes      Yes                                 +---------+---------------+---------+-----------+----------+--------------+ SFJ  Full           Yes      Yes                                 +---------+---------------+---------+-----------+----------+--------------+ FV Prox  Full           Yes      Yes                                 +---------+---------------+---------+-----------+----------+--------------+ FV Mid   Full           Yes      Yes                                 +---------+---------------+---------+-----------+----------+--------------+ FV DistalFull           Yes      Yes                                 +---------+---------------+---------+-----------+----------+--------------+ PFV      Full                                                        +---------+---------------+---------+-----------+----------+--------------+ POP      Full           Yes      Yes                                 +---------+---------------+---------+-----------+----------+--------------+ PERO     Full           Yes      Yes                                 +---------+---------------+---------+-----------+----------+--------------+ Gastroc  Full                                                        +---------+---------------+---------+-----------+----------+--------------+ GSV      Full           Yes      Yes                                 +---------+---------------+---------+-----------+----------+--------------+   +---------+---------------+---------+-----------+----------+--------------+ LEFT      CompressibilityPhasicitySpontaneityPropertiesThrombus Aging +---------+---------------+---------+-----------+----------+--------------+ CFV      Full           Yes      Yes                                 +---------+---------------+---------+-----------+----------+--------------+ SFJ      Full           Yes      Yes                                 +---------+---------------+---------+-----------+----------+--------------+  FV Prox  Full           Yes      Yes                                 +---------+---------------+---------+-----------+----------+--------------+ FV Mid   Full           Yes      Yes                                 +---------+---------------+---------+-----------+----------+--------------+ FV DistalFull           Yes      Yes                                 +---------+---------------+---------+-----------+----------+--------------+ PFV      Full                                                        +---------+---------------+---------+-----------+----------+--------------+ POP      Full           Yes      Yes                                 +---------+---------------+---------+-----------+----------+--------------+ PERO     Full           Yes      Yes                                 +---------+---------------+---------+-----------+----------+--------------+ Gastroc  Full                                                        +---------+---------------+---------+-----------+----------+--------------+ GSV      Full           Yes      Yes                                 +---------+---------------+---------+-----------+----------+--------------+    Summary: RIGHT: - No evidence of deep vein thrombosis in the lower extremity. No indirect evidence of obstruction proximal to the inguinal ligament. - No cystic structure found in the popliteal fossa.  LEFT: - No evidence of deep vein thrombosis in the lower extremity. No indirect  evidence of obstruction proximal to the inguinal ligament. - No cystic structure found in the popliteal fossa.  *See table(s) above for measurements and observations. Electronically signed by Larae Grooms MD on 10/26/2019 at 10:42:09 PM.    Final     Assessment & Plan:   [name] [name] was seen today for annual exam and hypertension.  Diagnoses and all orders for this visit:  Essential hypertension- Her blood pressure is adequately well controlled. -     CBC with Differential/Platelet; Future -     TSH; Future -  Hepatic function panel; Future -     VITAMIN D 25 Hydroxy (Vit-D Deficiency, Fractures); Future -     Thyroid Panel With TSH; Future -     EKG 12-Lead -     Thyroid Panel With TSH -     VITAMIN D 25 Hydroxy (Vit-D Deficiency, Fractures) -     Hepatic function panel -     TSH -     CBC with Differential/Platelet -     indapamide (LOZOL) 1.25 MG tablet; TAKE 1 TABLET(1.25 MG) BY MOUTH DAILY  Hyperglycemia- Her A1c is up to 6.5%. -     Basic metabolic panel; Future -     Hemoglobin A1c; Future -     Hemoglobin A1c -     Basic metabolic panel  Routine health maintenance- Exam completed, labs reviewed, vaccines reviewed and updated, cancer screenings are up-to-date, patient education was given. -     Lipid panel; Future -     HIV Antibody (routine testing w rflx); Future -     HIV Antibody (routine testing w rflx) -     Lipid panel  Vitamin D deficiency disease- Her D is normal now. -     VITAMIN D 25 Hydroxy (Vit-D Deficiency, Fractures); Future -     VITAMIN D 25 Hydroxy (Vit-D Deficiency, Fractures)  Mild episode of recurrent major depressive disorder ([place])- Labs are negative for secondary causes.  Will continue duloxetine. -     TSH; Future -     Thyroid Panel With TSH; Future -     Thyroid Panel With TSH -     TSH  DOE (dyspnea on exertion)- Her exam, EKG, and labs are reassuring.  I think her symptoms are related to anemia, obesity, and poor conditioning. -      Troponin I (High Sensitivity); Future -     Brain natriuretic peptide; Future -     EKG 12-Lead -     Brain natriuretic peptide -     Troponin I (High Sensitivity)  Obesity (BMI 30.0-34.9)- Labs are negative for secondary causes.  She has developed DM2. -     Thyroid Panel With TSH; Future -     Ambulatory referral to General Surgery -     Thyroid Panel With TSH  Deficiency anemia- I will screen her for vitamin deficiencies. -     Vitamin B12; Future -     Iron; Future -     Folate; Future -     Ferritin; Future -     Vitamin B1; Future -     Vitamin B1 -     Ferritin -     Folate -     Iron -     Vitamin B12  Cough- Will check a chest x-ray to see if she has any postinflammatory changes. -     DG Chest 2 View; Future  Iron deficiency anemia, unspecified iron deficiency anemia type- Will treat with an oral iron supplement. -     Ferric Maltol (ACCRUFER) 30 MG CAPS; Take 1 capsule by mouth in the morning and at bedtime.  Vitamin B12 deficiency anemia due to intrinsic factor deficiency- I have asked her to start parenteral B12 replacement therapy.  Type II diabetes mellitus with manifestations ([place])- Her A1c is at 6.5%.  Medical therapy is not yet indicated.  Hyperlipidemia with target LDL less than 130- I recommended she take a statin for cardiovascular risk reduction. -     Pitavastatin Calcium 2 MG  TABS; Take 1 tablet (2 mg total) by mouth daily.  Bilateral leg edema- This is responded well to indapamide. -     indapamide (LOZOL) 1.25 MG tablet; TAKE 1 TABLET(1.25 MG) BY MOUTH DAILY  I have discontinued [name] [name]'s estradiol and predniSONE. I am also having her start on ACCRUFeR and Pitavastatin Calcium. Additionally, I am having her maintain her DULoxetine, metoprolol tartrate, buPROPion, estrogens-methylTEST, and indapamide.  Meds ordered this encounter  Medications   Ferric Maltol (ACCRUFER) 30 MG CAPS    Sig: Take 1 capsule by mouth in the morning and at  bedtime.    Dispense:  180 capsule    Refill:  1   Pitavastatin Calcium 2 MG TABS    Sig: Take 1 tablet (2 mg total) by mouth daily.    Dispense:  90 tablet    Refill:  1   indapamide (LOZOL) 1.25 MG tablet    Sig: TAKE 1 TABLET(1.25 MG) BY MOUTH DAILY    Dispense:  90 tablet    Refill:  0   In addition to time spent on CPE, I spent 50 minutes in preparing to see the patient by review of recent labs, obtaining and reviewing separately obtained history, communicating with the patient, ordering medications, an EKG, and CXR, and documenting clinical information in the EHR including the differential Dx, treatment, and any further evaluation and other management of 1. Essential hypertension 2. Hyperglycemia 3. Vitamin D deficiency disease 4. Mild episode of recurrent major depressive disorder ([place]) 5. DOE (dyspnea on exertion) 6. Obesity (BMI 30.0-34.9) 7. Deficiency anemia 8. Cough 9. Iron deficiency anemia, unspecified iron deficiency anemia type 10. Vitamin B12 deficiency anemia due to intrinsic factor deficiency 11. Type II diabetes mellitus with manifestations ([place]) 12. Hyperlipidemia with target LDL less than 130 13. Bilateral leg edema       Follow-up: Return in about 3 months (around 12/20/2020).  Scarlette Calico, MD

## 2020-09-21 ENCOUNTER — Other Ambulatory Visit: Payer: Self-pay | Admitting: Internal Medicine

## 2020-09-21 DIAGNOSIS — I10 Essential (primary) hypertension: Secondary | ICD-10-CM

## 2020-09-21 DIAGNOSIS — R600 Localized edema: Secondary | ICD-10-CM

## 2020-09-21 MED ORDER — INDAPAMIDE 1.25 MG PO TABS: ORAL_TABLET | ORAL | 0 refills | Status: DC

## 2020-09-22 LAB — HIV ANTIBODY (ROUTINE TESTING W REFLEX): HIV 1&2 Ab, 4th Generation: NONREACTIVE

## 2020-09-22 LAB — THYROID PANEL WITH TSH
Free Thyroxine Index: 1.6 (ref 1.4–3.8)
T3 Uptake: 26 % (ref 22–35)
T4, Total: 6.3 ug/dL (ref 5.1–11.9)
TSH: 3.54 mIU/L (ref 0.40–4.50)

## 2020-09-22 LAB — VITAMIN B1: Vitamin B1 (Thiamine): 10 nmol/L (ref 8–30)

## 2020-09-25 ENCOUNTER — Telehealth: Payer: Self-pay

## 2020-09-25 ENCOUNTER — Other Ambulatory Visit: Payer: Self-pay

## 2020-09-25 ENCOUNTER — Ambulatory Visit (INDEPENDENT_AMBULATORY_CARE_PROVIDER_SITE_OTHER): Payer: Commercial Managed Care - PPO

## 2020-09-25 ENCOUNTER — Other Ambulatory Visit: Payer: Self-pay | Admitting: Internal Medicine

## 2020-09-25 DIAGNOSIS — E785 Hyperlipidemia, unspecified: Secondary | ICD-10-CM

## 2020-09-25 DIAGNOSIS — R059 Cough, unspecified: Secondary | ICD-10-CM

## 2020-09-25 DIAGNOSIS — D510 Vitamin B12 deficiency anemia due to intrinsic factor deficiency: Secondary | ICD-10-CM

## 2020-09-25 MED ORDER — CYANOCOBALAMIN 1000 MCG/ML IJ SOLN
1000.0000 ug | INTRAMUSCULAR | Status: DC
  Administered 2020-09-25: 1000 ug via INTRAMUSCULAR

## 2020-09-25 MED ORDER — ATORVASTATIN CALCIUM 10 MG PO TABS: 10.0000 mg | ORAL_TABLET | Freq: Every day | ORAL | 1 refills | Status: DC

## 2020-09-25 NOTE — Telephone Encounter (Signed)
B12 on 09/19/20 was 189. Per Dr Ronnald Ramp, b12 injections weekly for 3 weeks, then monthly thereafter.

## 2020-09-25 NOTE — Progress Notes (Signed)
Pt given B12 injection w/o any complications.  Pt is to come back for 2nd weekly B12 injection 10/02/2020 @ 4pm.

## 2020-09-26 ENCOUNTER — Telehealth: Payer: Self-pay | Admitting: Internal Medicine

## 2020-09-26 NOTE — Telephone Encounter (Signed)
   Patient is requesting a call back in regards to a referral to hematology.

## 2020-09-27 ENCOUNTER — Encounter: Payer: Self-pay | Admitting: Internal Medicine

## 2020-09-27 ENCOUNTER — Other Ambulatory Visit: Payer: Self-pay | Admitting: Internal Medicine

## 2020-09-27 DIAGNOSIS — D509 Iron deficiency anemia, unspecified: Secondary | ICD-10-CM

## 2020-09-27 NOTE — Telephone Encounter (Signed)
Please advise. Per the last OV note from 7/5 a hematology referral was not discussed.

## 2020-09-27 NOTE — Telephone Encounter (Signed)
   Patient called again. She can be reached at [phone]

## 2020-09-28 ENCOUNTER — Telehealth: Payer: Self-pay | Admitting: Physician Assistant

## 2020-09-28 NOTE — Telephone Encounter (Signed)
Received a new hem referral from Dr. Ronnald Ramp for IDA. [name] [name] has been cld and scheduled to see [name] on 7/20 at 11am. Pt aware to arrive 20 minutes early.

## 2020-10-02 ENCOUNTER — Ambulatory Visit (INDEPENDENT_AMBULATORY_CARE_PROVIDER_SITE_OTHER): Payer: Commercial Managed Care - PPO

## 2020-10-02 ENCOUNTER — Other Ambulatory Visit: Payer: Self-pay | Admitting: Internal Medicine

## 2020-10-02 ENCOUNTER — Other Ambulatory Visit: Payer: Self-pay

## 2020-10-02 ENCOUNTER — Telehealth: Payer: Self-pay

## 2020-10-02 DIAGNOSIS — D510 Vitamin B12 deficiency anemia due to intrinsic factor deficiency: Secondary | ICD-10-CM

## 2020-10-02 DIAGNOSIS — F330 Major depressive disorder, recurrent, mild: Secondary | ICD-10-CM

## 2020-10-02 MED ORDER — CYANOCOBALAMIN 1000 MCG/ML IJ SOLN
1000.0000 ug | INTRAMUSCULAR | Status: AC
  Administered 2020-10-02 – 2020-10-09 (×2): 1000 ug via INTRAMUSCULAR

## 2020-10-02 MED ORDER — DULOXETINE HCL 60 MG PO CPEP: 60.0000 mg | ORAL_CAPSULE | Freq: Every day | ORAL | 1 refills | Status: DC

## 2020-10-02 NOTE — Progress Notes (Signed)
Pt here for weekly B12 injection #2 of 3 per Dr Ronnald Ramp.  B12 1057mcg given IM in right deltoid and pt tolerated injection well.  Next B12 injection scheduled for 10/09/20.

## 2020-10-02 NOTE — Telephone Encounter (Signed)
Pt is here for b12 injection & states she is extremely fatigued & is having body aches.  She is requesting decreased work hours to work 20-30hrs/week due to fatigue.  Pt has hematology appt on Weds & will hold request until after that assessment.

## 2020-10-04 ENCOUNTER — Inpatient Hospital Stay: Payer: Commercial Managed Care - PPO | Attending: Physician Assistant | Admitting: Physician Assistant

## 2020-10-04 ENCOUNTER — Other Ambulatory Visit: Payer: Self-pay

## 2020-10-04 ENCOUNTER — Inpatient Hospital Stay: Payer: Commercial Managed Care - PPO

## 2020-10-04 ENCOUNTER — Encounter: Payer: Self-pay | Admitting: Physician Assistant

## 2020-10-04 VITALS — BP 135/85 | HR 83 | Temp 98.6°F | Resp 18 | Ht 66.0 in | Wt 213.4 lb

## 2020-10-04 DIAGNOSIS — D508 Other iron deficiency anemias: Secondary | ICD-10-CM

## 2020-10-04 DIAGNOSIS — D509 Iron deficiency anemia, unspecified: Secondary | ICD-10-CM | POA: Diagnosis present

## 2020-10-04 DIAGNOSIS — Z803 Family history of malignant neoplasm of breast: Secondary | ICD-10-CM

## 2020-10-04 DIAGNOSIS — E538 Deficiency of other specified B group vitamins: Secondary | ICD-10-CM | POA: Insufficient documentation

## 2020-10-04 LAB — CBC WITH DIFFERENTIAL (CANCER CENTER ONLY)
Abs Immature Granulocytes: 0.02 10*3/uL (ref 0.00–0.07)
Basophils Absolute: 0.1 10*3/uL (ref 0.0–0.1)
Basophils Relative: 1 %
Eosinophils Absolute: 0.2 10*3/uL (ref 0.0–0.5)
Eosinophils Relative: 2 %
HCT: 38.4 % (ref 36.0–46.0)
Hemoglobin: 11 g/dL — ABNORMAL LOW (ref 12.0–15.0)
Immature Granulocytes: 0 %
Lymphocytes Relative: 27 %
Lymphs Abs: 2.3 10*3/uL (ref 0.7–4.0)
MCH: 19.6 pg — ABNORMAL LOW (ref 26.0–34.0)
MCHC: 28.6 g/dL — ABNORMAL LOW (ref 30.0–36.0)
MCV: 68.4 fL — ABNORMAL LOW (ref 80.0–100.0)
Monocytes Absolute: 1.1 10*3/uL — ABNORMAL HIGH (ref 0.1–1.0)
Monocytes Relative: 13 %
Neutro Abs: 4.8 10*3/uL (ref 1.7–7.7)
Neutrophils Relative %: 57 %
Platelet Count: 539 10*3/uL — ABNORMAL HIGH (ref 150–400)
RBC: 5.61 MIL/uL — ABNORMAL HIGH (ref 3.87–5.11)
RDW: 19.6 % — ABNORMAL HIGH (ref 11.5–15.5)
WBC Count: 8.5 10*3/uL (ref 4.0–10.5)
nRBC: 0 % (ref 0.0–0.2)

## 2020-10-04 LAB — IRON AND TIBC
Iron: 21 ug/dL — ABNORMAL LOW (ref 41–142)
Saturation Ratios: 4 % — ABNORMAL LOW (ref 21–57)
TIBC: 469 ug/dL — ABNORMAL HIGH (ref 236–444)
UIBC: 448 ug/dL — ABNORMAL HIGH (ref 120–384)

## 2020-10-04 LAB — RETIC PANEL
Immature Retic Fract: 34 % — ABNORMAL HIGH (ref 2.3–15.9)
RBC.: 5.51 MIL/uL — ABNORMAL HIGH (ref 3.87–5.11)
Retic Count, Absolute: 93.1 10*3/uL (ref 19.0–186.0)
Retic Ct Pct: 1.7 % (ref 0.4–3.1)
Reticulocyte Hemoglobin: 19.1 pg — ABNORMAL LOW (ref >27.9)

## 2020-10-04 LAB — FERRITIN: Ferritin: 7 ng/mL — ABNORMAL LOW (ref 11–307)

## 2020-10-04 MED ORDER — FERROUS SULFATE 325 (65 FE) MG PO TBEC: 325.0000 mg | DELAYED_RELEASE_TABLET | Freq: Every day | ORAL | 3 refills | Status: DC

## 2020-10-04 NOTE — Progress Notes (Signed)
[place] Telephone:(336) [phone]   Fax:([phone]) [place] NOTE  Patient Care Team: Janith Lima, MD as PCP - General (Internal Medicine) Deboraha Sprang, MD (Cardiology) Chucky May, MD (Psychiatry)  Hematological/Oncological History 1) Labs from PCP, Dr. Scarlette Calico -09/21/2020: WBC 7.8, Hgb 10.5 (L), MCV 64.5 (L), Plt 438 (H), Iron 19 (L), Ferritin 6 (L), Vitamin B12 186 (L), Folate 12.9.   2) 09/25/2020: Initiated weekly vitamin B12 injections with PCP.  3) 10/04/2020: Establish care with Dede Query PA-C  CHIEF COMPLAINTS/PURPOSE OF CONSULTATION:  "Iron deficiency anemia "  HISTORY OF PRESENTING ILLNESS:  [name] [name] 60 y.o. female with medical history significant for IBS, hypertension, hyperlipidemia, bigeminy, anxiety and depression. Patient is unaccompanied for this visit.  On exam today, [name] [name] reports chronic and progressive fatigue. She reports that although she can do her daily activities on her own, she needs to rest frequently. She has a good appetite without any dietary restrictions. She craves ice chips and eats it daily. She has occasional episodes of nausea without vomiting episodes. She denies abdominal pain. Patient has occasional episodes of constipation that resolves with stool softeners. She denies easy bruising or signs of bleeding. She has noticed increased urinary frequency without burning with urination, hematuria or foul smelling urine. Patient reports progressive shortness of breath with exertion and now at rest. She has intermittent episodes of heart palpitations without chest pain. She has intermittent episodes of dizziness throughout the day without syncopal episodes. Patient notes she has some memory loss recently. She denies any fevers, chills, night sweats, cough, rash or peripheral edema. She has no other complaints. Rest of the 10 point ROS is below.   MEDICAL HISTORY:  Past Medical History:  Diagnosis Date    Anxiety    Bigeminy    Cognitive changes    Depression    Dysrhythmia    bigeminy   Elevated liver enzymes 2017   undiagnosed and levels recently were more normal per pt   Erythromelalgia ([employer])    Hyperlipidemia    Hypertension    IBS (irritable bowel syndrome)    Migraine, unspecified, without mention of intractable migraine without mention of status migrainosus    PONV (postoperative nausea and vomiting)    Tachycardia, unspecified    on BBloc    SURGICAL HISTORY: Past Surgical History:  Procedure Laterality Date   Bilateral myofascial release for recurrent plantar faciitis     BLADDER SUSPENSION N/A 08/09/2015   Procedure: TRANSVAGINAL TAPE (TVT) PROCEDURE;  Surgeon: Bobbye Charleston, MD;  Location: [facility];  Service: Gynecology;  Laterality: N/A;   CARPAL TUNNEL RELEASE     Colonoscopy with polypectomy  2016   Repeat 2021   CYSTOCELE REPAIR  08/09/2015   Procedure: ANTERIOR REPAIR (CYSTOCELE);  Surgeon: Bobbye Charleston, MD;  Location: [facility];  Service: Gynecology;;   CYSTOSCOPY N/A 08/09/2015   Procedure: Consuela Mimes;  Surgeon: Bobbye Charleston, MD;  Location: [facility];  Service: Gynecology;  Laterality: N/A;   ROBOTIC ASSISTED TOTAL HYSTERECTOMY WITH BILATERAL SALPINGO OOPHERECTOMY Bilateral 08/09/2015   Procedure: ROBOTIC ASSISTED TOTAL HYSTERECTOMY WITH BILATERAL SALPINGO OOPHORECTOMY;  Surgeon: Bobbye Charleston, MD;  Location: [facility];  Service: Gynecology;  Laterality: Bilateral;   TUBAL LIGATION      SOCIAL HISTORY: Social History   Socioeconomic History   Marital status: Married    Spouse name: Not on file   Number of children: 2   Years of education: 12   Highest education level: High school  graduate  Occupational History   Occupation: customer service/sales  Tobacco Use   Smoking status: Never   Smokeless tobacco: Never  Vaping Use   Vaping Use: Never used  Substance and Sexual Activity   Alcohol use: Yes    Comment: occasional wine   Drug use: No   Sexual  activity: Yes    Partners: Male  Other Topics Concern   Not on file  Social History Narrative   HSG. 1 year [employer]. Married '86-divorced after 2yrs; remarried  '96. No children, 2 step children. 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 Marriage is stable.      Lives at home with husband.   Right-handed.   One Diet Coke per day, 2 cups coffee per day.   Social Determinants of Health   Financial Resource Strain: Not on file  Food Insecurity: Not on file  Transportation Needs: Not on file  Physical Activity: Not on file  Stress: Not on file  Social Connections: Not on file  Intimate Partner Violence: Not on file    FAMILY HISTORY: Family History  Problem Relation Age of Onset   Cancer Mother        breast cancer-left mastectomy   Asthma Mother    Alzheimer's disease Mother    Transient ischemic attack Mother    Other Mother        ITP   Hypertension Father    Hyperlipidemia Father    Diabetes Father    Heart attack Father 33       CABG   Heart failure Father    Pancreatitis Father    Chronic fatigue Sister    Alzheimer's disease Maternal Grandmother    Breast cancer Maternal Grandmother     ALLERGIES:  is allergic to enalapril, codeine, and macrobid [nitrofurantoin].  MEDICATIONS:  Current Outpatient Medications  Medication Sig Dispense Refill   atorvastatin (LIPITOR) 10 MG tablet Take 1 tablet (10 mg total) by mouth daily. 90 tablet 1   buPROPion (WELLBUTRIN XL) 150 MG 24 hr tablet TAKE 1 TABLET(150 MG) BY MOUTH DAILY 90 tablet 1   DULoxetine (CYMBALTA) 60 MG capsule Take 1 capsule (60 mg total) by mouth daily. 90 capsule 1   estrogens-methylTEST (ESTRATEST) 1.25-2.5 MG tablet Take 1 tablet by mouth daily.     ferrous sulfate 325 (65 FE) MG EC tablet Take 1 tablet (325 mg total) by mouth daily with breakfast. 30 tablet 3   indapamide (LOZOL) 1.25 MG tablet TAKE 1 TABLET(1.25 MG) BY MOUTH DAILY 90 tablet 0    metoprolol tartrate (LOPRESSOR) 25 MG tablet TAKE 1 TABLET(25 MG) BY MOUTH TWICE DAILY 180 tablet 0   Current Facility-Administered Medications  Medication Dose Route Frequency Provider Last Rate Last Admin   cyanocobalamin ((VITAMIN B-12)) injection 1,000 mcg  1,000 mcg Intramuscular Weekly Janith Lima, MD   1,000 mcg at 10/02/20 1616    REVIEW OF SYSTEMS:   Constitutional: ( - ) fevers, ( - )  chills , ( - ) night sweats Eyes: ( - ) blurriness of vision, ( - ) double vision, ( - ) watery eyes Ears, nose, mouth, throat, and face: ( - ) mucositis, ( - ) sore throat Respiratory: ( - ) cough, ( +) dyspnea, ( - ) wheezes Cardiovascular: ( + ) palpitation, ( - ) chest discomfort, ( - ) lower extremity swelling Gastrointestinal:  ( + ) nausea, ( - ) heartburn, ( - ) change in  bowel habits Skin: ( - ) abnormal skin rashes Lymphatics: ( - ) new lymphadenopathy, ( - ) easy bruising Neurological: ( - ) numbness, ( - ) tingling, ( - ) new weaknesses Behavioral/Psych: ( - ) mood change, ( - ) new changes  All other systems were reviewed with the patient and are negative.  PHYSICAL EXAMINATION: ECOG PERFORMANCE STATUS: 1 - Symptomatic but completely ambulatory  Vitals:   10/04/20 1100  BP: 135/85  Pulse: 83  Resp: 18  Temp: 98.6 F (37 C)  SpO2: 99%   Filed Weights   10/04/20 1100  Weight: 213 lb 6.4 oz (96.8 kg)    GENERAL: well appearing female in NAD  SKIN: skin color, texture, turgor are normal, no rashes or significant lesions EYES: conjunctiva are pink and non-injected, sclera clear OROPHARYNX: no exudate, no erythema; lips, buccal mucosa, and tongue normal  NECK: supple, non-tender LYMPH:  no palpable lymphadenopathy in the cervical, axillary or supraclavicular lymph nodes.  LUNGS: clear to auscultation and percussion with normal breathing effort HEART: regular rate & rhythm and no murmurs and no lower extremity edema ABDOMEN: soft, non-tender, non-distended, normal bowel  sounds Musculoskeletal: no cyanosis of digits and no clubbing  PSYCH: alert & oriented x 3, fluent speech NEURO: no focal motor/sensory deficits  LABORATORY DATA:  I have reviewed the data as listed CBC Latest Ref Rng & Units 10/04/2020 09/19/2020 12/22/2018  WBC 4.0 - 10.5 K/uL 8.5 7.8 9.7  Hemoglobin 12.0 - 15.0 g/dL 11.0(L) 10.5(L) 12.3  Hematocrit 36.0 - 46.0 % 38.4 34.0(L) 36.9  Platelets 150 - 400 K/uL 539(H) 438.0(H) 396.0    CMP Latest Ref Rng & Units 09/19/2020 07/08/2019 12/29/2018  Glucose 70 - 99 mg/dL 140(H) 114(H) 118(H)  BUN 6 - 23 mg/dL 14 16 17   Creatinine 0.40 - 1.20 mg/dL 0.80 0.92 1.04  Sodium 135 - 145 mEq/L 139 139 137  Potassium 3.5 - 5.1 mEq/L 3.8 3.7 3.5  Chloride 96 - 112 mEq/L 101 100 101  CO2 19 - 32 mEq/L 32 33(H) 29  Calcium 8.4 - 10.5 mg/dL 9.7 9.8 9.3  Total Protein 6.0 - 8.3 g/dL 7.1 - -  Total Bilirubin 0.2 - 1.2 mg/dL 0.4 - -  Alkaline Phos 39 - 117 U/L 56 - -  AST 0 - 37 U/L 24 - -  ALT 0 - 35 U/L 29 - -     ASSESSMENT & PLAN [name] [name] is a female who presents to the clinic for iron deficiency anemia. Labs from 09/19/2020 shows hemoglobin of 10.5, MCV 64.5, ferritin 6.0 and vitamin B12 of 186. Patient has started weekly B12 injections with her PCP, Dr. Scarlette Calico.   Due to progressive fatigue and shortness of breath, I recommend initiating oral ferrous sulfate 325 mg once daily while we arrange for IV venofer x 5 doses. Patient will have repeat labs today to check CBC, Iron and TIBC, Retic Panel and Ferritin levels.   #Iron deficiency anemia: --Labs from 09/19/2020 revealed Hemoglobin 10.5, MCV 64.5 and Ferritin 6.0 --Uncertain etiology as patient denies any signs of bleeding.  --Patient underwent hysterectomy in 2017.  --Patient underwent endoscopy and colonoscopy on 07/14/2020 with Dr. Juanita Craver. Colonoscopy revealed small sessile polyp in the mid-sigmoid colon. EGD was unremarkable. --Recommend a follow up with Dr. Collene Mares in GI to evaluate  for malabsorptive conditions and/or rule out GI bleed with capsular endoscopy.  --Recommend to start oral ferrous sulfate 325 mg once daily while we await scheduling IV venofer  x 5 doses. Advised patient to take oral iron with a source of vitamin C and not to take it concurrently with antiacids/calcium.  --Provided iron rich foods to incorporate into diet.  --Labs today to check CBC, iron and TIBC, retic panel and ferritin levels.  --RTC 4 weeks after completing IV venofer with repeat labs.    #Vitamin B12 deficiency: --Currently receiving B12 injections with PCP. --Will recheck levels at next office visit.    Orders Placed This Encounter  Procedures   CBC with Differential ([facility] Only)    Standing Status:   Future    Number of Occurrences:   1    Standing Expiration Date:   10/04/2021   Iron and TIBC    Standing Status:   Future    Number of Occurrences:   1    Standing Expiration Date:   10/04/2021   Retic Panel    Standing Status:   Future    Number of Occurrences:   1    Standing Expiration Date:   10/04/2021   Ferritin    Standing Status:   Future    Number of Occurrences:   1    Standing Expiration Date:   10/04/2021    All questions were answered. The patient knows to call the clinic with any problems, questions or concerns.  I have spent a total of 60 minutes minutes of face-to-face and non-face-to-face time, preparing to see the patient, obtaining and/or reviewing separately obtained history, performing a medically appropriate examination, counseling and educating the patient, ordering medications/tests, referring and communicating with other health care professionals, documenting clinical information in the electronic health record, and care coordination.   Dede Query, PA-C Department of Hematology/Oncology [place] [facility] Phone: [phone]  Patient was seen with Dr. Lorenso Courier.   I have read the above note and personally  examined the patient. I agree with the assessment and plan as noted above.  Briefly [name] [name] is a 60 year old female with medical history significant for newly diagnosed vitamin B12 deficiency and iron deficiency.  She is currently started vitamin B12 shots with her primary care provider.  At this time would recommend that we pursue IV Venofer 200 mg q. 7 days x 5 doses in order to help bolster her iron levels.  The patient has established with GI who is currently working up her iron deficiency anemia.  He has completed EGD and colonoscopy both of which were unremarkable.  Given the deficiency in both of these nutritional labs there is concern for either malnutrition or poor absorption.  We will plan to see her back approximately 4 weeks after her last dose of IV iron therapy.   Ledell Peoples, MD Department of Hematology/Oncology [place] [facility] Phone: [phone] Pager: [phone] Email: [email or web address]

## 2020-10-05 ENCOUNTER — Encounter: Payer: Self-pay | Admitting: Physician Assistant

## 2020-10-05 ENCOUNTER — Other Ambulatory Visit: Payer: Self-pay | Admitting: Internal Medicine

## 2020-10-05 DIAGNOSIS — F330 Major depressive disorder, recurrent, mild: Secondary | ICD-10-CM

## 2020-10-08 ENCOUNTER — Encounter: Payer: Self-pay | Admitting: Physician Assistant

## 2020-10-09 ENCOUNTER — Telehealth: Payer: Self-pay

## 2020-10-09 ENCOUNTER — Other Ambulatory Visit: Payer: Self-pay

## 2020-10-09 ENCOUNTER — Ambulatory Visit (INDEPENDENT_AMBULATORY_CARE_PROVIDER_SITE_OTHER): Payer: Commercial Managed Care - PPO

## 2020-10-09 DIAGNOSIS — D510 Vitamin B12 deficiency anemia due to intrinsic factor deficiency: Secondary | ICD-10-CM

## 2020-10-09 NOTE — Progress Notes (Signed)
Pt here for weekly B12 injection #3 of 3 per Dr Ronnald Ramp.  B12 1053mg given IM in right deltoid and pt tolerated injection well.  Next B12 injection scheduled for 11/09/20.

## 2020-10-09 NOTE — Telephone Encounter (Signed)
Today I was directed to the voicemail for Dr. Lorie Apley nurse, Butch Penny. I left the patient details and information from Elouise Munroe message below in the voicemail and requested that they reach out to the patient to follow up. I am currently waiting to hear back from [facility] with [facility] to confirm that they will follow up with the patient.

## 2020-10-09 NOTE — Telephone Encounter (Signed)
-----   Message from Lincoln Brigham, PA-C sent at 10/05/2020 11:24 AM EDT ----- Regarding: F/U with Dr. Collene Mares in [place],  [name] [name] underwent EGD and colonoscopy in April 2022 with Dr. Juanita Craver from [facility]. She has iron deficiency anemia based on labs from today. we need to have Ms. Horlacher follow up with Dr. Collene Mares to evaluate for any malabsorptive issues versus GI bleed with capsular endoscopy.   Would you please reach out to Dr. Lorie Apley team and ask them to follow up?  Thanks, [name]

## 2020-10-09 NOTE — Telephone Encounter (Signed)
I reached back out to [facility] to speak with Dr. Lorie Apley nurse, Butch Penny. Fortunately, Butch Penny was available to take my call. I informed Butch Penny that Dede Query, PA would like Dr. Collene Mares to evaluate the patient for any malabsorptive issues versus GI bleed with capsular endoscopy. Butch Penny confirmed that her team would be reaching out to the patient for a follow up and requested the most recent OV notes along with labs. OV notes and labs faxed to [phone] with receipt of confirmation.

## 2020-10-10 ENCOUNTER — Encounter: Payer: Self-pay | Admitting: *Deleted

## 2020-10-10 ENCOUNTER — Encounter: Payer: Self-pay | Admitting: Internal Medicine

## 2020-10-12 ENCOUNTER — Telehealth: Payer: Self-pay

## 2020-10-12 NOTE — Telephone Encounter (Signed)
Spoke with [name] [name] regarding FMLA leave and her specific needs. She stated that she is unable to work at this time and needs continuous leave through 10/30/20.She states that she is experiencing extreme fatigue, weakness, dizziness, nausea,and shortness of breath with exertion and at rest. She reports occasional episodes of heart palpitations. She is awaiting authorization for Venofer before weekly treatments can begin. FMLA paperwork completed and forwarded to Provider for review.

## 2020-10-16 ENCOUNTER — Telehealth: Payer: Self-pay

## 2020-10-16 NOTE — Telephone Encounter (Signed)
FMLA paperwork returned via email to Patient and via fax to Employer as requested. Fax transmission confirmation received.

## 2020-10-17 ENCOUNTER — Telehealth: Payer: Self-pay | Admitting: Physician Assistant

## 2020-10-17 NOTE — Telephone Encounter (Signed)
Scheduled appts per 8/1 sch msg. Called pt, no answer. Left msg with appts dates and times.

## 2020-10-24 ENCOUNTER — Encounter (HOSPITAL_COMMUNITY)
Admission: RE | Disposition: A | Discharge status: Home / Self Care | Payer: Self-pay | Source: Home / Self Care | Attending: Gastroenterology

## 2020-10-24 ENCOUNTER — Ambulatory Visit (HOSPITAL_COMMUNITY)
Admission: RE | Admit: 2020-10-24 | Discharge: 2020-10-24 | Disposition: A | Discharge status: Home / Self Care | Payer: Commercial Managed Care - PPO | Attending: Gastroenterology | Admitting: Gastroenterology

## 2020-10-24 DIAGNOSIS — D509 Iron deficiency anemia, unspecified: Secondary | ICD-10-CM | POA: Diagnosis present

## 2020-10-24 HISTORY — PX: GIVENS CAPSULE STUDY: SHX5432

## 2020-10-24 SURGERY — IMAGING PROCEDURE, GI TRACT, INTRALUMINAL, VIA CAPSULE

## 2020-10-24 SURGICAL SUPPLY — 1 items: TOWEL COTTON PACK 4EA (MISCELLANEOUS) ×4 IMPLANT

## 2020-10-24 NOTE — Progress Notes (Signed)
RN reviewed capsule study procedure with patient who stated understanding.  RN applied capsule belt in appropriate location.  RN activated capsule study device with pill camera.  RN monitored as patient swallowed capsule.  RN reviewed diet instructions for the next 8 hours.  Patient stated understanding.

## 2020-10-25 ENCOUNTER — Encounter: Payer: Self-pay | Admitting: Internal Medicine

## 2020-10-25 ENCOUNTER — Other Ambulatory Visit: Payer: Self-pay | Admitting: Internal Medicine

## 2020-10-25 ENCOUNTER — Encounter (HOSPITAL_COMMUNITY): Payer: Self-pay | Admitting: Gastroenterology

## 2020-10-25 DIAGNOSIS — D510 Vitamin B12 deficiency anemia due to intrinsic factor deficiency: Secondary | ICD-10-CM

## 2020-10-25 MED ORDER — CYANOCOBALAMIN 500 MCG/0.1ML NA SOLN: 500.0000 ug | NASAL | 1 refills | Status: DC

## 2020-10-26 ENCOUNTER — Encounter: Payer: Self-pay | Admitting: Physician Assistant

## 2020-10-27 ENCOUNTER — Other Ambulatory Visit: Payer: Self-pay

## 2020-10-27 ENCOUNTER — Inpatient Hospital Stay: Payer: Commercial Managed Care - PPO | Attending: Physician Assistant

## 2020-10-27 VITALS — BP 114/73 | HR 70 | Temp 98.5°F | Resp 16

## 2020-10-27 DIAGNOSIS — D509 Iron deficiency anemia, unspecified: Secondary | ICD-10-CM | POA: Diagnosis present

## 2020-10-27 DIAGNOSIS — D508 Other iron deficiency anemias: Secondary | ICD-10-CM

## 2020-10-27 MED ORDER — SODIUM CHLORIDE 0.9 % IV SOLN
Freq: Once | INTRAVENOUS | Status: AC
  Filled 2020-10-27: qty 250

## 2020-10-27 MED ORDER — SODIUM CHLORIDE 0.9 % IV SOLN
200.0000 mg | Freq: Once | INTRAVENOUS | Status: AC
  Administered 2020-10-27: 200 mg via INTRAVENOUS
  Filled 2020-10-27: qty 200

## 2020-10-27 NOTE — Patient Instructions (Signed)

## 2020-11-03 ENCOUNTER — Inpatient Hospital Stay: Payer: Commercial Managed Care - PPO

## 2020-11-03 ENCOUNTER — Other Ambulatory Visit: Payer: Self-pay

## 2020-11-03 VITALS — BP 123/78 | HR 56 | Temp 98.7°F | Resp 18

## 2020-11-03 DIAGNOSIS — D509 Iron deficiency anemia, unspecified: Secondary | ICD-10-CM | POA: Diagnosis not present

## 2020-11-03 DIAGNOSIS — D508 Other iron deficiency anemias: Secondary | ICD-10-CM

## 2020-11-03 MED ORDER — SODIUM CHLORIDE 0.9 % IV SOLN
200.0000 mg | Freq: Once | INTRAVENOUS | Status: AC
  Administered 2020-11-03: 200 mg via INTRAVENOUS
  Filled 2020-11-03: qty 200

## 2020-11-03 MED ORDER — SODIUM CHLORIDE 0.9 % IV SOLN: Freq: Once | INTRAVENOUS | Status: AC

## 2020-11-03 NOTE — Patient Instructions (Signed)

## 2020-11-09 ENCOUNTER — Ambulatory Visit: Payer: Commercial Managed Care - PPO

## 2020-11-10 ENCOUNTER — Inpatient Hospital Stay: Payer: Commercial Managed Care - PPO

## 2020-11-10 ENCOUNTER — Ambulatory Visit (INDEPENDENT_AMBULATORY_CARE_PROVIDER_SITE_OTHER): Payer: Commercial Managed Care - PPO

## 2020-11-10 ENCOUNTER — Other Ambulatory Visit: Payer: Self-pay

## 2020-11-10 VITALS — BP 124/74 | HR 55 | Temp 98.2°F | Resp 16

## 2020-11-10 DIAGNOSIS — Z23 Encounter for immunization: Secondary | ICD-10-CM | POA: Diagnosis not present

## 2020-11-10 DIAGNOSIS — D510 Vitamin B12 deficiency anemia due to intrinsic factor deficiency: Secondary | ICD-10-CM

## 2020-11-10 DIAGNOSIS — D508 Other iron deficiency anemias: Secondary | ICD-10-CM

## 2020-11-10 DIAGNOSIS — D509 Iron deficiency anemia, unspecified: Secondary | ICD-10-CM | POA: Diagnosis not present

## 2020-11-10 MED ORDER — SODIUM CHLORIDE 0.9 % IV SOLN: Freq: Once | INTRAVENOUS | Status: AC

## 2020-11-10 MED ORDER — CYANOCOBALAMIN 1000 MCG/ML IJ SOLN
1000.0000 ug | Freq: Once | INTRAMUSCULAR | Status: AC
  Administered 2020-11-10: 1000 ug via INTRAMUSCULAR

## 2020-11-10 MED ORDER — SODIUM CHLORIDE 0.9 % IV SOLN
200.0000 mg | Freq: Once | INTRAVENOUS | Status: AC
  Administered 2020-11-10: 200 mg via INTRAVENOUS
  Filled 2020-11-10: qty 200

## 2020-11-10 NOTE — Progress Notes (Signed)
Patient was observed for 30 minutes post infusion with no adverse reaction. Vitals stable and patient in no distress upon leaving infusion clinic.

## 2020-11-10 NOTE — Progress Notes (Signed)
Pt here for monthly B12 injection per Dr. Jones  B12 1000mcg given IM, and pt tolerated injection well.   

## 2020-11-17 ENCOUNTER — Inpatient Hospital Stay: Payer: Commercial Managed Care - PPO | Attending: Physician Assistant

## 2020-11-17 ENCOUNTER — Other Ambulatory Visit: Payer: Self-pay

## 2020-11-17 VITALS — BP 127/74 | HR 62 | Temp 98.0°F | Resp 18

## 2020-11-17 DIAGNOSIS — D509 Iron deficiency anemia, unspecified: Secondary | ICD-10-CM | POA: Insufficient documentation

## 2020-11-17 DIAGNOSIS — D508 Other iron deficiency anemias: Secondary | ICD-10-CM

## 2020-11-17 MED ORDER — SODIUM CHLORIDE 0.9 % IV SOLN: Freq: Once | INTRAVENOUS | Status: AC

## 2020-11-17 MED ORDER — SODIUM CHLORIDE 0.9 % IV SOLN
200.0000 mg | Freq: Once | INTRAVENOUS | Status: AC
  Administered 2020-11-17: 200 mg via INTRAVENOUS
  Filled 2020-11-17: qty 200

## 2020-11-17 NOTE — Patient Instructions (Signed)

## 2020-11-24 ENCOUNTER — Inpatient Hospital Stay: Payer: Commercial Managed Care - PPO

## 2020-11-24 ENCOUNTER — Other Ambulatory Visit: Payer: Self-pay

## 2020-11-24 VITALS — BP 140/71 | HR 58 | Temp 98.5°F | Resp 18

## 2020-11-24 DIAGNOSIS — D509 Iron deficiency anemia, unspecified: Secondary | ICD-10-CM | POA: Diagnosis not present

## 2020-11-24 DIAGNOSIS — D508 Other iron deficiency anemias: Secondary | ICD-10-CM

## 2020-11-24 MED ORDER — SODIUM CHLORIDE 0.9 % IV SOLN
200.0000 mg | Freq: Once | INTRAVENOUS | Status: AC
  Administered 2020-11-24: 200 mg via INTRAVENOUS
  Filled 2020-11-24: qty 200

## 2020-11-24 MED ORDER — SODIUM CHLORIDE 0.9 % IV SOLN: Freq: Once | INTRAVENOUS | Status: AC

## 2020-11-24 NOTE — Patient Instructions (Signed)

## 2020-11-30 LAB — HM DIABETES EYE EXAM

## 2020-12-14 ENCOUNTER — Other Ambulatory Visit: Payer: Self-pay | Admitting: Physician Assistant

## 2020-12-14 DIAGNOSIS — D508 Other iron deficiency anemias: Secondary | ICD-10-CM

## 2020-12-15 ENCOUNTER — Inpatient Hospital Stay: Payer: Commercial Managed Care - PPO

## 2020-12-15 ENCOUNTER — Inpatient Hospital Stay: Payer: Commercial Managed Care - PPO | Admitting: Physician Assistant

## 2020-12-20 ENCOUNTER — Other Ambulatory Visit: Payer: Self-pay | Admitting: Internal Medicine

## 2020-12-20 DIAGNOSIS — R600 Localized edema: Secondary | ICD-10-CM

## 2020-12-20 DIAGNOSIS — I10 Essential (primary) hypertension: Secondary | ICD-10-CM

## 2020-12-22 ENCOUNTER — Ambulatory Visit: Payer: Commercial Managed Care - PPO | Admitting: Physician Assistant

## 2020-12-22 ENCOUNTER — Other Ambulatory Visit: Payer: Commercial Managed Care - PPO

## 2020-12-27 ENCOUNTER — Other Ambulatory Visit: Payer: Self-pay

## 2020-12-27 ENCOUNTER — Inpatient Hospital Stay: Payer: Commercial Managed Care - PPO | Attending: Physician Assistant

## 2020-12-27 ENCOUNTER — Inpatient Hospital Stay (HOSPITAL_BASED_OUTPATIENT_CLINIC_OR_DEPARTMENT_OTHER): Payer: Commercial Managed Care - PPO | Admitting: Physician Assistant

## 2020-12-27 VITALS — BP 135/78 | HR 67 | Temp 99.1°F | Resp 17 | Wt 218.6 lb

## 2020-12-27 DIAGNOSIS — D508 Other iron deficiency anemias: Secondary | ICD-10-CM

## 2020-12-27 DIAGNOSIS — Z90710 Acquired absence of both cervix and uterus: Secondary | ICD-10-CM | POA: Insufficient documentation

## 2020-12-27 DIAGNOSIS — E538 Deficiency of other specified B group vitamins: Secondary | ICD-10-CM | POA: Diagnosis not present

## 2020-12-27 DIAGNOSIS — Z803 Family history of malignant neoplasm of breast: Secondary | ICD-10-CM | POA: Diagnosis not present

## 2020-12-27 DIAGNOSIS — D509 Iron deficiency anemia, unspecified: Secondary | ICD-10-CM | POA: Diagnosis present

## 2020-12-27 LAB — CBC WITH DIFFERENTIAL (CANCER CENTER ONLY)
Abs Immature Granulocytes: 0.02 10*3/uL (ref 0.00–0.07)
Basophils Absolute: 0.1 10*3/uL (ref 0.0–0.1)
Basophils Relative: 1 %
Eosinophils Absolute: 0.2 10*3/uL (ref 0.0–0.5)
Eosinophils Relative: 2 %
HCT: 51 % — ABNORMAL HIGH (ref 36.0–46.0)
Hemoglobin: 16.3 g/dL — ABNORMAL HIGH (ref 12.0–15.0)
Immature Granulocytes: 0 %
Lymphocytes Relative: 27 %
Lymphs Abs: 2 10*3/uL (ref 0.7–4.0)
MCH: 26.4 pg (ref 26.0–34.0)
MCHC: 32 g/dL (ref 30.0–36.0)
MCV: 82.7 fL (ref 80.0–100.0)
Monocytes Absolute: 0.6 10*3/uL (ref 0.1–1.0)
Monocytes Relative: 8 %
Neutro Abs: 4.6 10*3/uL (ref 1.7–7.7)
Neutrophils Relative %: 62 %
Platelet Count: 322 10*3/uL (ref 150–400)
RBC: 6.17 MIL/uL — ABNORMAL HIGH (ref 3.87–5.11)
RDW: 19.8 % — ABNORMAL HIGH (ref 11.5–15.5)
WBC Count: 7.5 10*3/uL (ref 4.0–10.5)
nRBC: 0 % (ref 0.0–0.2)

## 2020-12-27 LAB — IRON AND TIBC
Iron: 143 ug/dL (ref 28–170)
Saturation Ratios: 40 % — ABNORMAL HIGH (ref 10.4–31.8)
TIBC: 357 ug/dL (ref 250–450)
UIBC: 214 ug/dL

## 2020-12-27 LAB — RETIC PANEL
Immature Retic Fract: 6 % (ref 2.3–15.9)
RBC.: 6.08 MIL/uL — ABNORMAL HIGH (ref 3.87–5.11)
Retic Count, Absolute: 57.8 10*3/uL (ref 19.0–186.0)
Retic Ct Pct: 1 % (ref 0.4–3.1)
Reticulocyte Hemoglobin: 32.1 pg (ref >27.9)

## 2020-12-27 LAB — FERRITIN: Ferritin: 57 ng/mL (ref 11–307)

## 2020-12-27 NOTE — Progress Notes (Signed)
[place] Telephone:(336) [phone]   Fax:(336) [phone]  PROGRESS NOTE  Patient Care Team: Janith Lima, MD as PCP - General (Internal Medicine) Deboraha Sprang, MD (Cardiology) Chucky May, MD (Psychiatry)  Hematological/Oncological History 1) Labs from PCP, Dr. Scarlette Calico -09/21/2020: WBC 7.8, Hgb 10.5 (L), MCV 64.5 (L), Plt 438 (H), Iron 19 (L), Ferritin 6 (L), Vitamin B12 186 (L), Folate 12.9.   2) 09/25/2020: Initiated weekly vitamin B12 injections with PCP.  3) 10/04/2020: Establish care with Dede Query PA-C  4) 10/27/2020-11/24/2020: Received IV venofer x 5 doses  CHIEF COMPLAINTS/PURPOSE OF CONSULTATION:  "Iron deficiency anemia "  HISTORY OF PRESENTING ILLNESS:  [name] [name] 60 y.o. female returns today for a follow up for iron deficiency anemia. Patient is unaccompanied for this visit.   On exam today, [name] [name] reports improvement of energy levels since receiving IV iron. She has a good appetite without any dietary restrictions. She is able to complete her daily activities on her own. She denies any nausea, vomiting or abdominal pain. She has occasional episodes of constipation that resolves with stool softeners. She denies easy bruising or signs of bleeding. She continues to have some brain fog that still persists throughout the day.  She denies any fevers, chills, night sweats, shortness of breath, chest pain, cough, rash or peripheral edema. She has no other complaints. Rest of the 10 point ROS is below.   MEDICAL HISTORY:  Past Medical History:  Diagnosis Date   Anxiety    Bigeminy    Cognitive changes    Depression    Dysrhythmia    bigeminy   Elevated liver enzymes 2017   undiagnosed and levels recently were more normal per pt   Erythromelalgia ([place])    Hyperlipidemia    Hypertension    IBS (irritable bowel syndrome)    Migraine, unspecified, without mention of intractable migraine without mention of status migrainosus    PONV  (postoperative nausea and vomiting)    Tachycardia, unspecified    on BBloc    SURGICAL HISTORY: Past Surgical History:  Procedure Laterality Date   Bilateral myofascial release for recurrent plantar faciitis     BLADDER SUSPENSION N/A 08/09/2015   Procedure: TRANSVAGINAL TAPE (TVT) PROCEDURE;  Surgeon: Bobbye Charleston, MD;  Location: [facility];  Service: Gynecology;  Laterality: N/A;   CARPAL TUNNEL RELEASE     Colonoscopy with polypectomy  2016   Repeat 2021   CYSTOCELE REPAIR  08/09/2015   Procedure: ANTERIOR REPAIR (CYSTOCELE);  Surgeon: Bobbye Charleston, MD;  Location: [facility];  Service: Gynecology;;   CYSTOSCOPY N/A 08/09/2015   Procedure: Consuela Mimes;  Surgeon: Bobbye Charleston, MD;  Location: [facility];  Service: Gynecology;  Laterality: N/A;   GIVENS CAPSULE STUDY N/A 10/24/2020   Procedure: GIVENS CAPSULE STUDY;  Surgeon: Juanita Craver, MD;  Location: [facility];  Service: Endoscopy;  Laterality: N/A;   ROBOTIC ASSISTED TOTAL HYSTERECTOMY WITH BILATERAL SALPINGO OOPHERECTOMY Bilateral 08/09/2015   Procedure: ROBOTIC ASSISTED TOTAL HYSTERECTOMY WITH BILATERAL SALPINGO OOPHORECTOMY;  Surgeon: Bobbye Charleston, MD;  Location: [facility];  Service: Gynecology;  Laterality: Bilateral;   TUBAL LIGATION      SOCIAL HISTORY: Social History   Socioeconomic History   Marital status: Married    Spouse name: Not on file   Number of children: 2   Years of education: 12   Highest education level: High school graduate  Occupational History   Occupation: customer service/sales  Tobacco Use   Smoking status: Never  Smokeless tobacco: Never  Vaping Use   Vaping Use: Never used  Substance and Sexual Activity   Alcohol use: Yes    Comment: occasional wine   Drug use: No   Sexual activity: Yes    Partners: Male  Other Topics Concern   Not on file  Social History Narrative   HSG. 1 year [employer]. Married '86-divorced after 78yrs; remarried  '96. No children, 2 step children.  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 Marriage is stable.      Lives at home with husband.   Right-handed.   One Diet Coke per day, 2 cups coffee per day.   Social Determinants of Health   Financial Resource Strain: Not on file  Food Insecurity: Not on file  Transportation Needs: Not on file  Physical Activity: Not on file  Stress: Not on file  Social Connections: Not on file  Intimate Partner Violence: Not on file    FAMILY HISTORY: Family History  Problem Relation Age of Onset   Cancer Mother        breast cancer-left mastectomy   Asthma Mother    Alzheimer's disease Mother    Transient ischemic attack Mother    Other Mother        ITP   Hypertension Father    Hyperlipidemia Father    Diabetes Father    Heart attack Father 62       CABG   Heart failure Father    Pancreatitis Father    Chronic fatigue Sister    Alzheimer's disease Maternal Grandmother    Breast cancer Maternal Grandmother     ALLERGIES:  is allergic to enalapril, codeine, and macrobid [nitrofurantoin].  MEDICATIONS:  Current Outpatient Medications  Medication Sig Dispense Refill   atorvastatin (LIPITOR) 10 MG tablet Take 1 tablet (10 mg total) by mouth daily. 90 tablet 1   buPROPion (WELLBUTRIN XL) 150 MG 24 hr tablet TAKE 1 TABLET(150 MG) BY MOUTH DAILY 90 tablet 1   Carboxymethylcellulose Sodium (EYE DROPS OP) Place 1 drop into both eyes daily. replens     DULoxetine (CYMBALTA) 60 MG capsule TAKE 1 CAPSULE DAILY 90 capsule 1   estrogens-methylTEST (ESTRATEST) 1.25-2.5 MG tablet Take 1 tablet by mouth daily.     ferrous sulfate 325 (65 FE) MG EC tablet Take 1 tablet (325 mg total) by mouth daily with breakfast. 30 tablet 3   indapamide (LOZOL) 1.25 MG tablet TAKE 1 TABLET DAILY 90 tablet 0   metoprolol tartrate (LOPRESSOR) 25 MG tablet TAKE 1 TABLET(25 MG) BY MOUTH TWICE DAILY 180 tablet 0   Naproxen Sodium (ALEVE PO) Take 2 tablets by mouth as needed.      omeprazole (PRILOSEC) 20 MG capsule Take 20 mg by mouth daily.     Cyanocobalamin 500 MCG/0.1ML SOLN Place 0.1 mLs (500 mcg total) into the nose once a week. (Patient not taking: Reported on 12/27/2020) 12 each 1   No current facility-administered medications for this visit.    REVIEW OF SYSTEMS:   Constitutional: ( - ) fevers, ( - )  chills , ( - ) night sweats Eyes: ( - ) blurriness of vision, ( - ) double vision, ( - ) watery eyes Ears, nose, mouth, throat, and face: ( - ) mucositis, ( - ) sore throat Respiratory: ( - ) cough, ( _) dyspnea, ( - ) wheezes Cardiovascular: ( _ ) palpitation, ( - ) chest discomfort, ( - ) lower  extremity swelling Gastrointestinal:  ( _ ) nausea, ( - ) heartburn, ( - ) change in bowel habits Skin: ( - ) abnormal skin rashes Lymphatics: ( - ) new lymphadenopathy, ( - ) easy bruising Neurological: ( - ) numbness, ( - ) tingling, ( - ) new weaknesses Behavioral/Psych: ( - ) mood change, ( - ) new changes  All other systems were reviewed with the patient and are negative.  PHYSICAL EXAMINATION: ECOG PERFORMANCE STATUS: 1 - Symptomatic but completely ambulatory  Vitals:   12/27/20 1457  BP: 135/78  Pulse: 67  Resp: 17  Temp: 99.1 F (37.3 C)  SpO2: 96%   Filed Weights   12/27/20 1457  Weight: 218 lb 9.6 oz (99.2 kg)    GENERAL: well appearing female in NAD  SKIN: skin color, texture, turgor are normal, no rashes or significant lesions EYES: conjunctiva are pink and non-injected, sclera clear OROPHARYNX: no exudate, no erythema; lips, buccal mucosa, and tongue normal  LUNGS: clear to auscultation and percussion with normal breathing effort HEART: regular rate & rhythm and no murmurs and no lower extremity edema ABDOMEN: soft, non-tender, non-distended, normal bowel sounds Musculoskeletal: no cyanosis of digits and no clubbing  PSYCH: alert & oriented x 3, fluent speech NEURO: no focal motor/sensory deficits  LABORATORY DATA:  I have  reviewed the data as listed CBC Latest Ref Rng & Units 12/27/2020 10/04/2020 09/19/2020  WBC 4.0 - 10.5 K/uL 7.5 8.5 7.8  Hemoglobin 12.0 - 15.0 g/dL 16.3(H) 11.0(L) 10.5(L)  Hematocrit 36.0 - 46.0 % 51.0(H) 38.4 34.0(L)  Platelets 150 - 400 K/uL 322 539(H) 438.0(H)    CMP Latest Ref Rng & Units 09/19/2020 07/08/2019 12/29/2018  Glucose 70 - 99 mg/dL 140(H) 114(H) 118(H)  BUN 6 - 23 mg/dL 14 16 17   Creatinine 0.40 - 1.20 mg/dL 0.80 0.92 1.04  Sodium 135 - 145 mEq/L 139 139 137  Potassium 3.5 - 5.1 mEq/L 3.8 3.7 3.5  Chloride 96 - 112 mEq/L 101 100 101  CO2 19 - 32 mEq/L 32 33(H) 29  Calcium 8.4 - 10.5 mg/dL 9.7 9.8 9.3  Total Protein 6.0 - 8.3 g/dL 7.1 - -  Total Bilirubin 0.2 - 1.2 mg/dL 0.4 - -  Alkaline Phos 39 - 117 U/L 56 - -  AST 0 - 37 U/L 24 - -  ALT 0 - 35 U/L 29 - -     ASSESSMENT & PLAN [name] [name] is a female who returns for a follow up for iron deficiency anemia.   #Iron deficiency anemia: --Uncertain etiology as patient denies any signs of bleeding.  --Patient underwent hysterectomy in 2017.  --Patient underwent endoscopy and colonoscopy on 07/14/2020 with Dr. Juanita Craver. Colonoscopy revealed small sessile polyp in the mid-sigmoid colon. EGD was unremarkable. --Underwent capsular endoscopy on 10/24/2020 which showed normal appearing small bowel without any source of bleeding.  --Received IV venofer x 5 doses from 10/27/2020-11/24/2020 --Labs today show iron deficiency anemia has resolved.  Ferritin improved from 7 to 57, iron saturation improved from 4% to 40%, Hgb improved from 11.0 to 16.3.   --Currently on ferrous sulfate 325 mg once. Okay to hold.  --Need to follow up with Dr. Collene Mares in GI if patient becomes anemia again.  --RTC 3 months with labs.   #Vitamin B12 deficiency: --Received B12 injections with PCP and transitioning to oral supplementation.  --Will recheck levels at next office visit.    No orders of the defined types were placed in this  encounter.  All questions were answered. The patient knows to call the clinic with any problems, questions or concerns.  I have spent a total of 25 minutes minutes of face-to-face and non-face-to-face time, preparing to see the patient, performing a medically appropriate examination, counseling and educating the patient, ordering tests, documenting clinical information in the electronic health record, and care coordination.   Dede Query, PA-C Department of Hematology/Oncology [place] [facility] Phone: [phone]

## 2021-03-04 ENCOUNTER — Other Ambulatory Visit: Payer: Self-pay | Admitting: Internal Medicine

## 2021-03-04 DIAGNOSIS — F330 Major depressive disorder, recurrent, mild: Secondary | ICD-10-CM

## 2021-03-06 ENCOUNTER — Other Ambulatory Visit: Payer: Self-pay | Admitting: Internal Medicine

## 2021-03-06 DIAGNOSIS — E785 Hyperlipidemia, unspecified: Secondary | ICD-10-CM

## 2021-03-21 ENCOUNTER — Encounter: Payer: Self-pay | Admitting: Physician Assistant

## 2021-03-22 ENCOUNTER — Other Ambulatory Visit: Payer: Self-pay | Admitting: Internal Medicine

## 2021-03-22 DIAGNOSIS — R600 Localized edema: Secondary | ICD-10-CM

## 2021-03-22 DIAGNOSIS — I10 Essential (primary) hypertension: Secondary | ICD-10-CM

## 2021-03-28 ENCOUNTER — Other Ambulatory Visit: Payer: Self-pay

## 2021-03-28 ENCOUNTER — Inpatient Hospital Stay (HOSPITAL_BASED_OUTPATIENT_CLINIC_OR_DEPARTMENT_OTHER): Payer: BC Managed Care – PPO | Admitting: Physician Assistant

## 2021-03-28 ENCOUNTER — Inpatient Hospital Stay: Payer: BC Managed Care – PPO | Attending: Physician Assistant

## 2021-03-28 ENCOUNTER — Encounter: Payer: Self-pay | Admitting: Physician Assistant

## 2021-03-28 VITALS — BP 141/82 | HR 73 | Temp 97.6°F | Resp 17 | Wt 223.9 lb

## 2021-03-28 DIAGNOSIS — D509 Iron deficiency anemia, unspecified: Secondary | ICD-10-CM | POA: Insufficient documentation

## 2021-03-28 DIAGNOSIS — E538 Deficiency of other specified B group vitamins: Secondary | ICD-10-CM | POA: Diagnosis not present

## 2021-03-28 DIAGNOSIS — D508 Other iron deficiency anemias: Secondary | ICD-10-CM

## 2021-03-28 DIAGNOSIS — D751 Secondary polycythemia: Secondary | ICD-10-CM | POA: Diagnosis not present

## 2021-03-28 LAB — CBC WITH DIFFERENTIAL (CANCER CENTER ONLY)
Abs Immature Granulocytes: 0.02 10*3/uL (ref 0.00–0.07)
Basophils Absolute: 0.1 10*3/uL (ref 0.0–0.1)
Basophils Relative: 1 %
Eosinophils Absolute: 0.2 10*3/uL (ref 0.0–0.5)
Eosinophils Relative: 2 %
HCT: 52.8 % — ABNORMAL HIGH (ref 36.0–46.0)
Hemoglobin: 17.7 g/dL — ABNORMAL HIGH (ref 12.0–15.0)
Immature Granulocytes: 0 %
Lymphocytes Relative: 24 %
Lymphs Abs: 2.1 10*3/uL (ref 0.7–4.0)
MCH: 28.8 pg (ref 26.0–34.0)
MCHC: 33.5 g/dL (ref 30.0–36.0)
MCV: 85.9 fL (ref 80.0–100.0)
Monocytes Absolute: 0.8 10*3/uL (ref 0.1–1.0)
Monocytes Relative: 9 %
Neutro Abs: 5.6 10*3/uL (ref 1.7–7.7)
Neutrophils Relative %: 64 %
Platelet Count: 302 10*3/uL (ref 150–400)
RBC: 6.15 MIL/uL — ABNORMAL HIGH (ref 3.87–5.11)
RDW: 14.7 % (ref 11.5–15.5)
WBC Count: 8.8 10*3/uL (ref 4.0–10.5)
nRBC: 0 % (ref 0.0–0.2)

## 2021-03-28 LAB — FERRITIN: Ferritin: 72 ng/mL (ref 11–307)

## 2021-03-29 ENCOUNTER — Encounter: Payer: Self-pay | Admitting: Physician Assistant

## 2021-03-29 LAB — IRON AND IRON BINDING CAPACITY (CC-WL,HP ONLY)
Iron: 92 ug/dL (ref 28–170)
Saturation Ratios: 26 % (ref 10.4–31.8)
TIBC: 361 ug/dL (ref 250–450)
UIBC: 269 ug/dL (ref 148–442)

## 2021-03-30 ENCOUNTER — Other Ambulatory Visit: Payer: Self-pay | Admitting: Internal Medicine

## 2021-03-30 DIAGNOSIS — F330 Major depressive disorder, recurrent, mild: Secondary | ICD-10-CM

## 2021-04-01 ENCOUNTER — Encounter: Payer: Self-pay | Admitting: Physician Assistant

## 2021-04-01 DIAGNOSIS — D751 Secondary polycythemia: Secondary | ICD-10-CM | POA: Insufficient documentation

## 2021-04-01 NOTE — Progress Notes (Signed)
[place] Telephone:(336) [phone]   Fax:(336) [phone]  PROGRESS NOTE  Patient Care Team: Janith Lima, MD as PCP - General (Internal Medicine) Deboraha Sprang, MD (Cardiology) Chucky May, MD (Psychiatry)  Hematological/Oncological History 1) Labs from PCP, Dr. Scarlette Calico -09/21/2020: WBC 7.8, Hgb 10.5 (L), MCV 64.5 (L), Plt 438 (H), Iron 19 (L), Ferritin 6 (L), Vitamin B12 186 (L), Folate 12.9.   2) 09/25/2020: Initiated weekly vitamin B12 injections with PCP.  3) 10/04/2020: Establish care with Dede Query PA-C  4) 10/27/2020-11/24/2020: Received IV venofer x 5 doses  CHIEF COMPLAINTS/PURPOSE OF CONSULTATION:  "Iron deficiency anemia "  HISTORY OF PRESENTING ILLNESS:  [name] [name] 61 y.o. female returns today for a follow up for iron deficiency anemia. Patient is unaccompanied for this visit.   On exam today, Ms. Delao that she has noticed her energy levels have started to decline in the past month. It is still much improved compared to before receiving IV iron infusion. She continues to complete all her daily activities on her own. She denies any dietary restrictions or weight changes. She denies nausea, vomiting or abdominal pain. Her bowel habits are unchanged with occasional episodes of constipation. She denies easy bruising or signs of bleeding. She reports some dizziness. She denies any fevers, chills, night sweats, shortness of breath, chest pain, cough, rash or peripheral edema. She has no other complaints. Rest of the 10 point ROS is below.   MEDICAL HISTORY:  Past Medical History:  Diagnosis Date   Anxiety    Bigeminy    Cognitive changes    Depression    Dysrhythmia    bigeminy   Elevated liver enzymes 2017   undiagnosed and levels recently were more normal per pt   Erythromelalgia ([place])    Hyperlipidemia    Hypertension    IBS (irritable bowel syndrome)    Migraine, unspecified, without mention of intractable migraine without mention  of status migrainosus    PONV (postoperative nausea and vomiting)    Tachycardia, unspecified    on BBloc    SURGICAL HISTORY: Past Surgical History:  Procedure Laterality Date   Bilateral myofascial release for recurrent plantar faciitis     BLADDER SUSPENSION N/A 08/09/2015   Procedure: TRANSVAGINAL TAPE (TVT) PROCEDURE;  Surgeon: Bobbye Charleston, MD;  Location: [facility];  Service: Gynecology;  Laterality: N/A;   CARPAL TUNNEL RELEASE     Colonoscopy with polypectomy  2016   Repeat 2021   CYSTOCELE REPAIR  08/09/2015   Procedure: ANTERIOR REPAIR (CYSTOCELE);  Surgeon: Bobbye Charleston, MD;  Location: [facility];  Service: Gynecology;;   CYSTOSCOPY N/A 08/09/2015   Procedure: Consuela Mimes;  Surgeon: Bobbye Charleston, MD;  Location: [facility];  Service: Gynecology;  Laterality: N/A;   GIVENS CAPSULE STUDY N/A 10/24/2020   Procedure: GIVENS CAPSULE STUDY;  Surgeon: Juanita Craver, MD;  Location: [facility] ENDOSCOPY;  Service: Endoscopy;  Laterality: N/A;   ROBOTIC ASSISTED TOTAL HYSTERECTOMY WITH BILATERAL SALPINGO OOPHERECTOMY Bilateral 08/09/2015   Procedure: ROBOTIC ASSISTED TOTAL HYSTERECTOMY WITH BILATERAL SALPINGO OOPHORECTOMY;  Surgeon: Bobbye Charleston, MD;  Location: [facility];  Service: Gynecology;  Laterality: Bilateral;   TUBAL LIGATION      SOCIAL HISTORY: Social History   Socioeconomic History   Marital status: Married    Spouse name: Not on file   Number of children: 2   Years of education: 12   Highest education level: High school graduate  Occupational History   Occupation: customer service/sales  Tobacco Use  Smoking status: Never   Smokeless tobacco: Never  Vaping Use   Vaping Use: Never used  Substance and Sexual Activity   Alcohol use: Yes    Comment: occasional wine   Drug use: No   Sexual activity: Yes    Partners: Male  Other Topics Concern   Not on file  Social History Narrative   HSG. 1 year [employer]. Married '86-divorced after 52yrs; remarried  '96. No  children, 2 step children. 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 Marriage is stable.      Lives at home with husband.   Right-handed.   One Diet Coke per day, 2 cups coffee per day.   Social Determinants of Health   Financial Resource Strain: Not on file  Food Insecurity: Not on file  Transportation Needs: Not on file  Physical Activity: Not on file  Stress: Not on file  Social Connections: Not on file  Intimate Partner Violence: Not on file    FAMILY HISTORY: Family History  Problem Relation Age of Onset   Cancer Mother        breast cancer-left mastectomy   Asthma Mother    Alzheimer's disease Mother    Transient ischemic attack Mother    Other Mother        ITP   Hypertension Father    Hyperlipidemia Father    Diabetes Father    Heart attack Father 11       CABG   Heart failure Father    Pancreatitis Father    Chronic fatigue Sister    Alzheimer's disease Maternal Grandmother    Breast cancer Maternal Grandmother     ALLERGIES:  is allergic to enalapril, codeine, and macrobid [nitrofurantoin].  MEDICATIONS:  Current Outpatient Medications  Medication Sig Dispense Refill   atorvastatin (LIPITOR) 10 MG tablet TAKE 1 TABLET DAILY 90 tablet 0   buPROPion (WELLBUTRIN XL) 150 MG 24 hr tablet TAKE 1 TABLET(150 MG) BY MOUTH DAILY 90 tablet 1   Carboxymethylcellulose Sodium (EYE DROPS OP) Place 1 drop into both eyes daily. replens     estrogens-methylTEST (ESTRATEST) 1.25-2.5 MG tablet Take 1 tablet by mouth daily.     indapamide (LOZOL) 1.25 MG tablet TAKE 1 TABLET DAILY 90 tablet 0   metoprolol tartrate (LOPRESSOR) 25 MG tablet TAKE 1 TABLET(25 MG) BY MOUTH TWICE DAILY 180 tablet 0   Naproxen Sodium (ALEVE PO) Take 2 tablets by mouth as needed.     omeprazole (PRILOSEC) 20 MG capsule Take 20 mg by mouth daily.     DULoxetine (CYMBALTA) 60 MG capsule TAKE 1 CAPSULE DAILY 90 capsule 0   No current facility-administered  medications for this visit.    REVIEW OF SYSTEMS:   Constitutional: ( - ) fevers, ( - )  chills , ( - ) night sweats Eyes: ( - ) blurriness of vision, ( - ) double vision, ( - ) watery eyes Ears, nose, mouth, throat, and face: ( - ) mucositis, ( - ) sore throat Respiratory: ( - ) cough, ( -) dyspnea, ( - ) wheezes Cardiovascular: ( -) palpitation, ( - ) chest discomfort, ( - ) lower extremity swelling Gastrointestinal:  ( - ) nausea, ( - ) heartburn, ( - ) change in bowel habits Skin: ( - ) abnormal skin rashes Lymphatics: ( - ) new lymphadenopathy, ( - ) easy bruising Neurological: ( - ) numbness, ( - ) tingling, ( - ) new weaknesses Behavioral/Psych: ( - )  mood change, ( - ) new changes  All other systems were reviewed with the patient and are negative.  PHYSICAL EXAMINATION: ECOG PERFORMANCE STATUS: 1 - Symptomatic but completely ambulatory  Vitals:   03/28/21 1452  BP: (!) 141/82  Pulse: 73  Resp: 17  Temp: 97.6 F (36.4 C)  SpO2: 99%   Filed Weights   03/28/21 1452  Weight: 223 lb 14.4 oz (101.6 kg)    GENERAL: well appearing female in NAD  SKIN: skin color, texture, turgor are normal, no rashes or significant lesions EYES: conjunctiva are pink and non-injected, sclera clear OROPHARYNX: no exudate, no erythema; lips, buccal mucosa, and tongue normal  LUNGS: clear to auscultation and percussion with normal breathing effort HEART: regular rate & rhythm and no murmurs and no lower extremity edema ABDOMEN: soft, non-tender, non-distended, normal bowel sounds Musculoskeletal: no cyanosis of digits and no clubbing  PSYCH: alert & oriented x 3, fluent speech NEURO: no focal motor/sensory deficits  LABORATORY DATA:  I have reviewed the data as listed CBC Latest Ref Rng & Units 03/28/2021 12/27/2020 10/04/2020  WBC 4.0 - 10.5 K/uL 8.8 7.5 8.5  Hemoglobin 12.0 - 15.0 g/dL 17.7(H) 16.3(H) 11.0(L)  Hematocrit 36.0 - 46.0 % 52.8(H) 51.0(H) 38.4  Platelets 150 - 400 K/uL 302  322 539(H)    CMP Latest Ref Rng & Units 09/19/2020 07/08/2019 12/29/2018  Glucose 70 - 99 mg/dL 140(H) 114(H) 118(H)  BUN 6 - 23 mg/dL 14 16 17   Creatinine 0.40 - 1.20 mg/dL 0.80 0.92 1.04  Sodium 135 - 145 mEq/L 139 139 137  Potassium 3.5 - 5.1 mEq/L 3.8 3.7 3.5  Chloride 96 - 112 mEq/L 101 100 101  CO2 19 - 32 mEq/L 32 33(H) 29  Calcium 8.4 - 10.5 mg/dL 9.7 9.8 9.3  Total Protein 6.0 - 8.3 g/dL 7.1 - -  Total Bilirubin 0.2 - 1.2 mg/dL 0.4 - -  Alkaline Phos 39 - 117 U/L 56 - -  AST 0 - 37 U/L 24 - -  ALT 0 - 35 U/L 29 - -     ASSESSMENT & PLAN AALIAYAH MIAO is a female who returns for a follow up for iron deficiency anemia.   #Iron deficiency anemia: --Uncertain etiology as patient denies any signs of bleeding.  --Patient underwent hysterectomy in 2017.  --Patient underwent endoscopy and colonoscopy on 07/14/2020 with Dr. Juanita Craver. Colonoscopy revealed small sessile polyp in the mid-sigmoid colon. EGD was unremarkable. --Underwent capsular endoscopy on 10/24/2020 which showed normal appearing small bowel without any source of bleeding.  --Received IV venofer x 5 doses from 10/27/2020-11/24/2020 --Labs today show no evidence of anemia at this time. Serum iron is 92, saturation is 26%, ferritin 72.  -No additional IV or oral iron needed at this time.   #Vitamin B12 deficiency: --Currently on oral supplementation.   #Polycythemia: --Hgb continues to rise after receiving IV iron back in August/September 2022. Patient is not taking oral iron at this time. She denies smoking, OSA or dehydration.  --Recommend for patient to return for additional labs to further evaluation (erythropoietin, BCR/ABL FISH, MPN panel)   Orders Placed This Encounter  Procedures   CBC with Differential ([facility] Only)    Standing Status:   Future    Number of Occurrences:   1    Standing Expiration Date:   03/28/2022   Ferritin    Standing Status:   Future    Number of Occurrences:   1     Standing  Expiration Date:   03/28/2022   Iron and Iron Binding Capacity ([facility] only)    Standing Status:   Future    Number of Occurrences:   1    Standing Expiration Date:   03/28/2022     All questions were answered. The patient knows to call the clinic with any problems, questions or concerns.  I have spent a total of 25 minutes minutes of face-to-face and non-face-to-face time, preparing to see the patient, performing a medically appropriate examination, counseling and educating the patient, ordering tests, documenting clinical information in the electronic health record, and care coordination.   Dede Query, PA-C Department of Hematology/Oncology [place] [facility] Phone: [phone]

## 2021-04-02 ENCOUNTER — Other Ambulatory Visit: Payer: Self-pay | Admitting: Internal Medicine

## 2021-04-02 ENCOUNTER — Telehealth: Payer: Self-pay | Admitting: Physician Assistant

## 2021-04-02 ENCOUNTER — Telehealth: Payer: Self-pay | Admitting: Internal Medicine

## 2021-04-02 DIAGNOSIS — R600 Localized edema: Secondary | ICD-10-CM

## 2021-04-02 DIAGNOSIS — I10 Essential (primary) hypertension: Secondary | ICD-10-CM

## 2021-04-02 MED ORDER — INDAPAMIDE 1.25 MG PO TABS: ORAL_TABLET | ORAL | 0 refills | Status: DC

## 2021-04-02 NOTE — Telephone Encounter (Signed)
1.Medication Requested: indapamide (LOZOL) 1.25 MG tablet  2. Pharmacy (Name, Street, [place]): [id number] - [place], [place]  3. On Med List: yes   4. Last Visit with PCP: 09-19-2020  5. Next visit date with PCP: 05-08-2021    Patient requesting a short supply until next appt   Patient states she is out of medication and has not taken it in 2 days

## 2021-04-02 NOTE — Telephone Encounter (Signed)
Called patient per Irene's request for lab work sometime this week. No answer .left message for patient to call back to schedule appt.

## 2021-04-05 ENCOUNTER — Inpatient Hospital Stay: Payer: BC Managed Care – PPO

## 2021-04-05 ENCOUNTER — Other Ambulatory Visit: Payer: Self-pay

## 2021-04-05 DIAGNOSIS — D751 Secondary polycythemia: Secondary | ICD-10-CM

## 2021-04-05 DIAGNOSIS — D509 Iron deficiency anemia, unspecified: Secondary | ICD-10-CM | POA: Diagnosis not present

## 2021-04-05 DIAGNOSIS — E538 Deficiency of other specified B group vitamins: Secondary | ICD-10-CM | POA: Diagnosis not present

## 2021-04-05 LAB — CBC WITH DIFFERENTIAL (CANCER CENTER ONLY)
Abs Immature Granulocytes: 0.02 10*3/uL (ref 0.00–0.07)
Basophils Absolute: 0.1 10*3/uL (ref 0.0–0.1)
Basophils Relative: 1 %
Eosinophils Absolute: 0.2 10*3/uL (ref 0.0–0.5)
Eosinophils Relative: 3 %
HCT: 50.5 % — ABNORMAL HIGH (ref 36.0–46.0)
Hemoglobin: 16.8 g/dL — ABNORMAL HIGH (ref 12.0–15.0)
Immature Granulocytes: 0 %
Lymphocytes Relative: 25 %
Lymphs Abs: 2 10*3/uL (ref 0.7–4.0)
MCH: 29 pg (ref 26.0–34.0)
MCHC: 33.3 g/dL (ref 30.0–36.0)
MCV: 87.1 fL (ref 80.0–100.0)
Monocytes Absolute: 1 10*3/uL (ref 0.1–1.0)
Monocytes Relative: 12 %
Neutro Abs: 4.7 10*3/uL (ref 1.7–7.7)
Neutrophils Relative %: 59 %
Platelet Count: 306 10*3/uL (ref 150–400)
RBC: 5.8 MIL/uL — ABNORMAL HIGH (ref 3.87–5.11)
RDW: 14.4 % (ref 11.5–15.5)
WBC Count: 8 10*3/uL (ref 4.0–10.5)
nRBC: 0 % (ref 0.0–0.2)

## 2021-04-05 LAB — VITAMIN B12: Vitamin B-12: 702 pg/mL (ref 180–914)

## 2021-04-05 NOTE — Telephone Encounter (Signed)
Patient requesting a call back to discuss short supply request

## 2021-04-05 NOTE — Telephone Encounter (Signed)
Pt has been informed that Rx was sent to [facility] on 1/16.

## 2021-04-06 LAB — ERYTHROPOIETIN: Erythropoietin: 11.4 m[IU]/mL (ref 2.6–18.5)

## 2021-04-16 ENCOUNTER — Telehealth: Payer: Self-pay | Admitting: Physician Assistant

## 2021-04-16 DIAGNOSIS — D751 Secondary polycythemia: Secondary | ICD-10-CM

## 2021-04-16 LAB — JAK2 (INCLUDING V617F AND EXON 12), MPL,& CALR W/RFL MPN PANEL (NGS)

## 2021-04-16 LAB — BCR ABL1 FISH (GENPATH)

## 2021-04-16 NOTE — Telephone Encounter (Signed)
I called Ms. Eicher to review the lab results from 04/05/2021. CBC shows improvement of polycythemia with Hgb decreased from 17.7 to 16.8. There is no evidence of myeloproliferative neoplams or BCR/ABL rearrangement. Remaining labs were unremarkable. Recommend to monitor Hgb levels closely. Continue to hold iron tablets since iron panel shows no deficiency.Take ASA 81 mg once daily and continue with adequate hydration. Patient will return in 8 weeks with repeat labs.

## 2021-04-17 ENCOUNTER — Telehealth: Payer: Self-pay | Admitting: Physician Assistant

## 2021-04-17 NOTE — Telephone Encounter (Signed)
Scheduled per 1/31 secure chat, pt has been called and confirmed

## 2021-05-08 ENCOUNTER — Other Ambulatory Visit: Payer: Self-pay

## 2021-05-08 ENCOUNTER — Encounter: Payer: Self-pay | Admitting: Internal Medicine

## 2021-05-08 ENCOUNTER — Ambulatory Visit (INDEPENDENT_AMBULATORY_CARE_PROVIDER_SITE_OTHER): Payer: BC Managed Care – PPO | Admitting: Internal Medicine

## 2021-05-08 VITALS — BP 142/82 | HR 63 | Temp 98.2°F | Resp 16 | Ht 66.0 in | Wt 223.0 lb

## 2021-05-08 DIAGNOSIS — E785 Hyperlipidemia, unspecified: Secondary | ICD-10-CM | POA: Diagnosis not present

## 2021-05-08 DIAGNOSIS — E118 Type 2 diabetes mellitus with unspecified complications: Secondary | ICD-10-CM | POA: Diagnosis not present

## 2021-05-08 DIAGNOSIS — F330 Major depressive disorder, recurrent, mild: Secondary | ICD-10-CM | POA: Diagnosis not present

## 2021-05-08 DIAGNOSIS — R768 Other specified abnormal immunological findings in serum: Secondary | ICD-10-CM

## 2021-05-08 DIAGNOSIS — I10 Essential (primary) hypertension: Secondary | ICD-10-CM

## 2021-05-08 DIAGNOSIS — M2550 Pain in unspecified joint: Secondary | ICD-10-CM | POA: Diagnosis not present

## 2021-05-08 DIAGNOSIS — I7381 Erythromelalgia: Secondary | ICD-10-CM | POA: Diagnosis not present

## 2021-05-08 LAB — URINALYSIS, ROUTINE W REFLEX MICROSCOPIC
Bilirubin Urine: NEGATIVE
Hgb urine dipstick: NEGATIVE
Ketones, ur: NEGATIVE
Leukocytes,Ua: NEGATIVE
Nitrite: NEGATIVE
Specific Gravity, Urine: 1.01 (ref 1.000–1.030)
Total Protein, Urine: NEGATIVE
Urine Glucose: NEGATIVE
Urobilinogen, UA: 0.2 (ref 0.0–1.0)
pH: 7 (ref 5.0–8.0)

## 2021-05-08 LAB — BASIC METABOLIC PANEL
BUN: 16 mg/dL (ref 6–23)
CO2: 38 mEq/L — ABNORMAL HIGH (ref 19–32)
Calcium: 10.3 mg/dL (ref 8.4–10.5)
Chloride: 97 mEq/L (ref 96–112)
Creatinine, Ser: 0.85 mg/dL (ref 0.40–1.20)
GFR: 74.48 mL/min (ref >60.00)
Glucose, Bld: 95 mg/dL (ref 70–99)
Potassium: 3.6 mEq/L (ref 3.5–5.1)
Sodium: 138 mEq/L (ref 135–145)

## 2021-05-08 LAB — HEMOGLOBIN A1C: Hgb A1c MFr Bld: 5.8 % (ref 4.6–6.5)

## 2021-05-08 LAB — MICROALBUMIN / CREATININE URINE RATIO
Creatinine,U: 40.6 mg/dL
Microalb Creat Ratio: 1.7 mg/g (ref 0.0–30.0)
Microalb, Ur: <0.7 mg/dL (ref 0.0–1.9)

## 2021-05-08 LAB — C-REACTIVE PROTEIN: CRP: <1 mg/dL (ref 0.5–20.0)

## 2021-05-08 MED ORDER — DULOXETINE HCL 60 MG PO CPEP: 60.0000 mg | ORAL_CAPSULE | Freq: Every day | ORAL | 1 refills | Status: DC

## 2021-05-08 MED ORDER — BUPROPION HCL ER (XL) 150 MG PO TB24: ORAL_TABLET | ORAL | 1 refills | Status: DC

## 2021-05-08 MED ORDER — ATORVASTATIN CALCIUM 10 MG PO TABS: 10.0000 mg | ORAL_TABLET | Freq: Every day | ORAL | 1 refills | Status: DC

## 2021-05-08 MED ORDER — INDAPAMIDE 1.25 MG PO TABS: ORAL_TABLET | ORAL | 0 refills | Status: DC

## 2021-05-08 MED ORDER — METOPROLOL TARTRATE 25 MG PO TABS: ORAL_TABLET | ORAL | 0 refills | Status: DC

## 2021-05-08 NOTE — Progress Notes (Signed)
Subjective:  Patient ID: [name] [name], female    DOB: Dec 15, 1960  Age: 61 y.o. MRN: [id number]  CC: Hypertension and Diabetes  This visit occurred during the SARS-CoV-2 public health emergency.  Safety protocols were in place, including screening questions prior to the visit, additional usage of staff PPE, and extensive cleaning of exam room while observing appropriate contact time as indicated for disinfecting solutions.    HPI GABRELLA STROH presents for f/up -   She complains of CP for >3 months - she describes it as a "stabbing" sensation under her sternum. The pains occurs at rest and is not exacerbated by walking. She does not exercise.  Outpatient Medications Prior to Visit  Medication Sig Dispense Refill   aspirin EC 81 MG tablet Take 81 mg by mouth daily. Swallow whole.     Carboxymethylcellulose Sodium (EYE DROPS OP) Place 1 drop into both eyes daily. replens     estradiol (ESTRACE) 1 MG tablet Take 1 mg by mouth daily.     Naproxen Sodium (ALEVE PO) Take 2 tablets by mouth as needed.     omeprazole (PRILOSEC) 20 MG capsule Take 20 mg by mouth daily.     atorvastatin (LIPITOR) 10 MG tablet TAKE 1 TABLET DAILY 90 tablet 0   buPROPion (WELLBUTRIN XL) 150 MG 24 hr tablet TAKE 1 TABLET(150 MG) BY MOUTH DAILY 90 tablet 1   DULoxetine (CYMBALTA) 60 MG capsule TAKE 1 CAPSULE DAILY 90 capsule 0   indapamide (LOZOL) 1.25 MG tablet TAKE 1 TABLET DAILY 90 tablet 0   metoprolol tartrate (LOPRESSOR) 25 MG tablet TAKE 1 TABLET(25 MG) BY MOUTH TWICE DAILY 180 tablet 0   estrogens-methylTEST (ESTRATEST) 1.25-2.5 MG tablet Take 1 tablet by mouth daily.     No facility-administered medications prior to visit.    ROS Review of Systems  Constitutional:  Positive for fatigue and unexpected weight change (wt gain). Negative for chills, diaphoresis and fever.  HENT: Negative.    Eyes: Negative.   Respiratory:  Negative for cough, chest tightness, shortness of breath and wheezing.    Cardiovascular:  Negative for chest pain and palpitations.  Gastrointestinal:  Negative for abdominal pain, constipation, diarrhea, nausea and vomiting.  Endocrine: Negative.   Genitourinary: Negative.  Negative for difficulty urinating and hematuria.  Musculoskeletal:  Positive for arthralgias. Negative for myalgias.  Skin:  Positive for rash. Negative for color change.  Neurological:  Negative for dizziness, weakness, light-headedness, numbness and headaches.  Hematological:  Negative for adenopathy. Does not bruise/bleed easily.  Psychiatric/Behavioral: Negative.     Objective:  BP (!) 142/82 (BP Location: Left Arm, Patient Position: Sitting, Cuff Size: Large)    Pulse 63    Temp 98.2 F (36.8 C) (Oral)    Resp 16    Ht 5\' 6"  (1.676 m)    Wt 223 lb (101.2 kg)    LMP  (LMP Unknown)    SpO2 98%    BMI 35.99 kg/m   BP Readings from Last 3 Encounters:  05/08/21 (!) 142/82  03/28/21 (!) 141/82  12/27/20 135/78    Wt Readings from Last 3 Encounters:  05/08/21 223 lb (101.2 kg)  03/28/21 223 lb 14.4 oz (101.6 kg)  12/27/20 218 lb 9.6 oz (99.2 kg)    Physical Exam Vitals reviewed.  Constitutional:      Appearance: She is obese.  HENT:     Nose: Nose normal.     Mouth/Throat:     Mouth: Mucous membranes are moist.  Eyes:     General: No scleral icterus.    Conjunctiva/sclera: Conjunctivae normal.  Cardiovascular:     Rate and Rhythm: Normal rate and regular rhythm.     Heart sounds: Normal heart sounds, S1 normal and S2 normal.    No friction rub. No gallop.     Comments: EKG- SB, 58 bpm Otherwise normal EKG Pulmonary:     Effort: Pulmonary effort is normal.     Breath sounds: No stridor. No wheezing, rhonchi or rales.  Abdominal:     General: Abdomen is flat.     Palpations: There is no mass.     Tenderness: There is no abdominal tenderness. There is no guarding.     Hernia: No hernia is present.  Musculoskeletal:     Cervical back: Neck supple.     Right lower  leg: No edema.     Left lower leg: No edema.  Lymphadenopathy:     Cervical: No cervical adenopathy.  Skin:    General: Skin is warm and dry.     Findings: No rash.  Neurological:     General: No focal deficit present.     Mental Status: She is alert. Mental status is at baseline.  Psychiatric:        Mood and Affect: Mood normal.        Behavior: Behavior normal.        Thought Content: Thought content normal.        Judgment: Judgment normal.    Lab Results  Component Value Date   WBC 8.0 04/05/2021   HGB 16.8 (H) 04/05/2021   HCT 50.5 (H) 04/05/2021   PLT 306 04/05/2021   GLUCOSE 95 05/08/2021   CHOL 175 09/19/2020   TRIG 111.0 09/19/2020   HDL 29.60 (L) 09/19/2020   LDLCALC 123 (H) 09/19/2020   ALT 29 09/19/2020   AST 24 09/19/2020   NA 138 05/08/2021   K 3.6 05/08/2021   CL 97 05/08/2021   CREATININE 0.85 05/08/2021   BUN 16 05/08/2021   CO2 38 (H) 05/08/2021   TSH 3.54 09/19/2020   TSH 2.98 09/19/2020   HGBA1C 5.8 05/08/2021   MICROALBUR <0.7 05/08/2021    No results found.  Assessment & Plan:   Eleaner was seen today for hypertension and diabetes.  Diagnoses and all orders for this visit:  Essential hypertension- Her blood pressure is not adequately well controlled.  She agrees to improve her lifestyle modifications. -     Urinalysis, Routine w reflex microscopic; Future -     Cancel: CBC with Differential/Platelet; Future -     Basic metabolic panel; Future -     indapamide (LOZOL) 1.25 MG tablet; TAKE 1 TABLET DAILY -     metoprolol tartrate (LOPRESSOR) 25 MG tablet; TAKE 1 TABLET(25 MG) BY MOUTH TWICE DAILY -     EKG 12-Lead -     Basic metabolic panel -     Urinalysis, Routine w reflex microscopic  Type II diabetes mellitus with manifestations ([place])- Her blood sugar is well controlled. -     Hemoglobin A1c; Future -     Urinalysis, Routine w reflex microscopic; Future -     Microalbumin / creatinine urine ratio; Future -     Basic metabolic  panel; Future -     Basic metabolic panel -     Microalbumin / creatinine urine ratio -     Urinalysis, Routine w reflex microscopic -     Hemoglobin A1c  Hyperlipidemia with target LDL less than 130- LDL goal achieved. Doing well on the statin  -     atorvastatin (LIPITOR) 10 MG tablet; Take 1 tablet (10 mg total) by mouth daily.  Mild episode of recurrent major depressive disorder (HCC) -     Discontinue: buPROPion (WELLBUTRIN XL) 150 MG 24 hr tablet; TAKE 1 TABLET(150 MG) BY MOUTH DAILY Strength: 150 mg -     DULoxetine (CYMBALTA) 60 MG capsule; Take 1 capsule (60 mg total) by mouth daily. -     buPROPion (WELLBUTRIN XL) 150 MG 24 hr tablet; TAKE 1 TABLET(150 MG) BY MOUTH DAILY  Erythromelalgia (HCC) -     C-reactive protein; Future -     Cyclic citrul peptide antibody, IgG; Future -     ANA; Future -     ANA -     Cyclic citrul peptide antibody, IgG -     C-reactive protein -     Ambulatory referral to Rheumatology  Arthralgia, unspecified joint -     C-reactive protein; Future -     Cyclic citrul peptide antibody, IgG; Future -     ANA; Future -     ANA -     Cyclic citrul peptide antibody, IgG -     C-reactive protein -     Ambulatory referral to Rheumatology  ANA positive -     Ambulatory referral to Rheumatology  Other orders -     Anti-nuclear ab-titer (ANA titer)   I have discontinued Aryia H. Kruzel's estrogens-methylTEST and buPROPion. I have also changed her atorvastatin, DULoxetine, and buPROPion. Additionally, I am having her maintain her omeprazole, Carboxymethylcellulose Sodium (EYE DROPS OP), Naproxen Sodium (ALEVE PO), aspirin EC, indapamide, metoprolol tartrate, and estradiol.  Meds ordered this encounter  Medications   atorvastatin (LIPITOR) 10 MG tablet    Sig: Take 1 tablet (10 mg total) by mouth daily.    Dispense:  90 tablet    Refill:  1   DISCONTD: buPROPion (WELLBUTRIN XL) 150 MG 24 hr tablet    Sig: TAKE 1 TABLET(150 MG) BY MOUTH  DAILY Strength: 150 mg    Dispense:  90 tablet    Refill:  1   DULoxetine (CYMBALTA) 60 MG capsule    Sig: Take 1 capsule (60 mg total) by mouth daily.    Dispense:  90 capsule    Refill:  1   indapamide (LOZOL) 1.25 MG tablet    Sig: TAKE 1 TABLET DAILY    Dispense:  90 tablet    Refill:  0   metoprolol tartrate (LOPRESSOR) 25 MG tablet    Sig: TAKE 1 TABLET(25 MG) BY MOUTH TWICE DAILY    Dispense:  180 tablet    Refill:  0   buPROPion (WELLBUTRIN XL) 150 MG 24 hr tablet    Sig: TAKE 1 TABLET(150 MG) BY MOUTH DAILY    Dispense:  90 tablet    Refill:  1     Follow-up: Return in about 3 months (around 08/05/2021).  Scarlette Calico, MD

## 2021-05-08 NOTE — Patient Instructions (Signed)

## 2021-05-09 DIAGNOSIS — M25561 Pain in right knee: Secondary | ICD-10-CM | POA: Diagnosis not present

## 2021-05-10 LAB — ANA: Anti Nuclear Antibody (ANA): POSITIVE — AB

## 2021-05-10 LAB — CYCLIC CITRUL PEPTIDE ANTIBODY, IGG: Cyclic Citrullin Peptide Ab: <16 UNITS

## 2021-05-10 LAB — ANTI-NUCLEAR AB-TITER (ANA TITER): ANA Titer 1: 1:80 {titer} — ABNORMAL HIGH

## 2021-05-11 DIAGNOSIS — R768 Other specified abnormal immunological findings in serum: Secondary | ICD-10-CM | POA: Insufficient documentation

## 2021-06-04 ENCOUNTER — Other Ambulatory Visit: Payer: Self-pay | Admitting: Internal Medicine

## 2021-06-04 DIAGNOSIS — E785 Hyperlipidemia, unspecified: Secondary | ICD-10-CM

## 2021-06-12 ENCOUNTER — Inpatient Hospital Stay: Payer: BC Managed Care – PPO | Attending: Physician Assistant

## 2021-06-12 ENCOUNTER — Inpatient Hospital Stay (HOSPITAL_BASED_OUTPATIENT_CLINIC_OR_DEPARTMENT_OTHER): Payer: BC Managed Care – PPO | Admitting: Physician Assistant

## 2021-06-12 ENCOUNTER — Other Ambulatory Visit: Payer: Self-pay

## 2021-06-12 VITALS — BP 129/80 | HR 65 | Temp 97.5°F | Resp 20 | Wt 224.8 lb

## 2021-06-12 DIAGNOSIS — D509 Iron deficiency anemia, unspecified: Secondary | ICD-10-CM | POA: Insufficient documentation

## 2021-06-12 DIAGNOSIS — D751 Secondary polycythemia: Secondary | ICD-10-CM

## 2021-06-12 DIAGNOSIS — Z90710 Acquired absence of both cervix and uterus: Secondary | ICD-10-CM | POA: Diagnosis not present

## 2021-06-12 DIAGNOSIS — Z803 Family history of malignant neoplasm of breast: Secondary | ICD-10-CM | POA: Diagnosis not present

## 2021-06-12 DIAGNOSIS — E538 Deficiency of other specified B group vitamins: Secondary | ICD-10-CM | POA: Insufficient documentation

## 2021-06-12 LAB — CBC WITH DIFFERENTIAL (CANCER CENTER ONLY)
Abs Immature Granulocytes: 0.02 10*3/uL (ref 0.00–0.07)
Basophils Absolute: 0.1 10*3/uL (ref 0.0–0.1)
Basophils Relative: 1 %
Eosinophils Absolute: 0.3 10*3/uL (ref 0.0–0.5)
Eosinophils Relative: 4 %
HCT: 50.9 % — ABNORMAL HIGH (ref 36.0–46.0)
Hemoglobin: 17 g/dL — ABNORMAL HIGH (ref 12.0–15.0)
Immature Granulocytes: 0 %
Lymphocytes Relative: 26 %
Lymphs Abs: 2.1 10*3/uL (ref 0.7–4.0)
MCH: 28.9 pg (ref 26.0–34.0)
MCHC: 33.4 g/dL (ref 30.0–36.0)
MCV: 86.6 fL (ref 80.0–100.0)
Monocytes Absolute: 0.9 10*3/uL (ref 0.1–1.0)
Monocytes Relative: 10 %
Neutro Abs: 5 10*3/uL (ref 1.7–7.7)
Neutrophils Relative %: 59 %
Platelet Count: 274 10*3/uL (ref 150–400)
RBC: 5.88 MIL/uL — ABNORMAL HIGH (ref 3.87–5.11)
RDW: 13.2 % (ref 11.5–15.5)
WBC Count: 8.4 10*3/uL (ref 4.0–10.5)
nRBC: 0 % (ref 0.0–0.2)

## 2021-06-12 LAB — IRON AND IRON BINDING CAPACITY (CC-WL,HP ONLY)
Iron: 95 ug/dL (ref 28–170)
Saturation Ratios: 28 % (ref 10.4–31.8)
TIBC: 343 ug/dL (ref 250–450)
UIBC: 248 ug/dL (ref 148–442)

## 2021-06-13 LAB — FERRITIN: Ferritin: 103 ng/mL (ref 11–307)

## 2021-06-14 ENCOUNTER — Encounter: Payer: Self-pay | Admitting: Physician Assistant

## 2021-06-14 NOTE — Progress Notes (Signed)
?[name] ?[phone]   [phone] ? ?PROGRESS NOTE ? ?Patient Care Team: ?Janith Lima, MD as PCP - General (Internal Medicine) ?Deboraha Sprang, MD (Cardiology) ?Chucky May, MD (Psychiatry) ?Monna Fam, MD as Consulting Physician (Ophthalmology) ? ?Hematological/Oncological History ?1) Labs from PCP, Dr. Scarlette Calico ?-09/21/2020: WBC 7.8, Hgb 10.5 (L), MCV 64.5 (L), Plt 438 (H), Iron 19 (L), Ferritin 6 (L), Vitamin B12 186 (L), Folate 12.9.  ? ?2) 09/25/2020: Initiated weekly vitamin B12 injections with PCP. ? ?3) 10/04/2020: Establish care with Dede Query PA-C ? ?4) 10/27/2020-11/24/2020: Received IV venofer x 5 doses ? ?CHIEF COMPLAINTS/PURPOSE OF CONSULTATION:  ?"Iron deficiency anemia " ? ?HISTORY OF PRESENTING ILLNESS:  ?[name] [name] 61 y.o. female returns today for a follow up for history of iron deficiency anemia s/p IV iron infusion. The last visit was on 03/28/2021 without any changes to her health. She is unaccompanied for this visit.  ? ?At today's visit, Ms. Pressnell reports that she has noticed her energy levels have declined since the last visit.  She continues to complete all her daily activities on her own.  Patient has a good appetite but is trying to eat healthier and by implementing a Mediterranean diet.  She denies nausea, vomiting or abdominal pain.  She reports chronic constipation with a bowel movement every 2 days.  She does take stool softeners and laxatives as needed with relief of symptoms.  Patient denies easy bruising or signs of active bleeding.  She reports persistent diffuse joint pain that is present on a daily basis.  She needs to take Aleve daily to help take the edge off.  She is scheduled for a rheumatology consultation next month.  Patient denies fevers, chills, night sweats, shortness of breath, chest pain or cough.  She has no other complaints.  Rest of the 10 point ROS is below. ? ?MEDICAL HISTORY:  ?Past Medical History:   ?Diagnosis Date  ? Anxiety   ? Bigeminy   ? Cognitive changes   ? Depression   ? Dysrhythmia   ? bigeminy  ? Elevated liver enzymes 2017  ? undiagnosed and levels recently were more normal per pt  ? Erythromelalgia ([place])   ? Hyperlipidemia   ? Hypertension   ? IBS (irritable bowel syndrome)   ? Migraine, unspecified, without mention of intractable migraine without mention of status migrainosus   ? PONV (postoperative nausea and vomiting)   ? Tachycardia, unspecified   ? on BBloc  ? ? ?SURGICAL HISTORY: ?Past Surgical History:  ?Procedure Laterality Date  ? Bilateral myofascial release for recurrent plantar faciitis    ? BLADDER SUSPENSION N/A 08/09/2015  ? Procedure: TRANSVAGINAL TAPE (TVT) PROCEDURE;  Surgeon: Bobbye Charleston, MD;  Location: [facility];  Service: Gynecology;  Laterality: N/A;  ? CARPAL TUNNEL RELEASE    ? Colonoscopy with polypectomy  2016  ? Repeat 2021  ? CYSTOCELE REPAIR  08/09/2015  ? Procedure: ANTERIOR REPAIR (CYSTOCELE);  Surgeon: Bobbye Charleston, MD;  Location: [facility];  Service: Gynecology;;  ? CYSTOSCOPY N/A 08/09/2015  ? Procedure: CYSTOSCOPY;  Surgeon: Bobbye Charleston, MD;  Location: [facility];  Service: Gynecology;  Laterality: N/A;  ? GIVENS CAPSULE STUDY N/A 10/24/2020  ? Procedure: GIVENS CAPSULE STUDY;  Surgeon: Juanita Craver, MD;  Location: [facility]OSCOPY;  Service: Endoscopy;  Laterality: N/A;  ? ROBOTIC ASSISTED TOTAL HYSTERECTOMY WITH BILATERAL SALPINGO OOPHERECTOMY Bilateral 08/09/2015  ? Procedure: ROBOTIC ASSISTED TOTAL HYSTERECTOMY WITH BILATERAL SALPINGO OOPHORECTOMY;  Surgeon: Bobbye Charleston, MD;  Location: [facility];  Service: Gynecology;  Laterality: Bilateral;  ? TUBAL LIGATION    ? ? ?SOCIAL HISTORY: ?Social History  ? ?Socioeconomic History  ? Marital status: Married  ?  Spouse name: Not on file  ? Number of children: 2  ? Years of education: 40  ? Highest education level: High school graduate  ?Occupational History  ? Occupation: customer service/sales  ?Tobacco Use  ? Smoking  status: Never  ? Smokeless tobacco: Never  ?Vaping Use  ? Vaping Use: Never used  ?Substance and Sexual Activity  ? Alcohol use: Yes  ?  Comment: occasional wine  ? Drug use: No  ? Sexual activity: Yes  ?  Partners: Male  ?Other Topics Concern  ? Not on file  ?Social History Narrative  ? HS[employer]. 1 year [employer]. Married '86-divorced after 6yr; remarried  '96. No children, 2 step children. 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 Marriage is stable.  ?   ? Lives at home with husband.  ? Right-handed.  ? One Diet Coke per day, 2 cups coffee per day.  ? ?Social Determinants of Health  ? ?Financial Resource Strain: Not on file  ?Food Insecurity: Not on file  ?Transportation Needs: Not on file  ?Physical Activity: Not on file  ?Stress: Not on file  ?Social Connections: Not on file  ?Intimate Partner Violence: Not on file  ? ? ?FAMILY HISTORY: ?Family History  ?Problem Relation Age of Onset  ? Cancer Mother   ?     breast cancer-left mastectomy  ? Asthma Mother   ? Alzheimer's disease Mother   ? Transient ischemic attack Mother   ? Other Mother   ?     ITP  ? Hypertension Father   ? Hyperlipidemia Father   ? Diabetes Father   ? Heart attack Father 651 ?     CABG  ? Heart failure Father   ? Pancreatitis Father   ? Chronic fatigue Sister   ? Alzheimer's disease Maternal Grandmother   ? Breast cancer Maternal Grandmother   ? ? ?ALLERGIES:  is allergic to enalapril, codeine, and macrobid [nitrofurantoin]. ? ?MEDICATIONS:  ?Current Outpatient Medications  ?Medication Sig Dispense Refill  ? aspirin EC 81 MG tablet Take 81 mg by mouth daily. Swallow whole.    ? atorvastatin (LIPITOR) 10 MG tablet TAKE 1 TABLET DAILY 90 tablet 0  ? buPROPion (WELLBUTRIN XL) 150 MG 24 hr tablet TAKE 1 TABLET(150 MG) BY MOUTH DAILY 90 tablet 1  ? Carboxymethylcellulose Sodium (EYE DROPS OP) Place 1 drop into both eyes daily. replens    ? DULoxetine (CYMBALTA) 60 MG capsule Take 1 capsule (60 mg  total) by mouth daily. 90 capsule 1  ? estradiol (ESTRACE) 1 MG tablet Take 1 mg by mouth daily.    ? indapamide (LOZOL) 1.25 MG tablet TAKE 1 TABLET DAILY 90 tablet 0  ? metoprolol tartrate (LOPRESSOR) 25 MG tablet TAKE 1 TABLET(25 MG) BY MOUTH TWICE DAILY 180 tablet 0  ? Naproxen Sodium (ALEVE PO) Take 2 tablets by mouth as needed.    ? omeprazole (PRILOSEC) 20 MG capsule Take 20 mg by mouth daily.    ? ?No current facility-administered medications for this visit.  ? ? ?REVIEW OF SYSTEMS:   ?Constitutional: ( - ) fevers, ( - )  chills , ( - ) night sweats ?Eyes: ( - ) blurriness of vision, ( - ) double vision, ( - )  watery eyes ?Ears, nose, mouth, throat, and face: ( - ) mucositis, ( - ) sore throat ?Respiratory: ( - ) cough, ( -) dyspnea, ( - ) wheezes ?Cardiovascular: ( -) palpitation, ( - ) chest discomfort, ( - ) lower extremity swelling ?Gastrointestinal:  ( - ) nausea, ( - ) heartburn, ( - ) change in bowel habits ?Skin: ( - ) abnormal skin rashes ?Lymphatics: ( - ) new lymphadenopathy, ( - ) easy bruising ?Neurological: ( - ) numbness, ( - ) tingling, ( - ) new weaknesses ?Behavioral/Psych: ( - ) mood change, ( - ) new changes  ?All other systems were reviewed with the patient and are negative. ? ?PHYSICAL EXAMINATION: ?ECOG PERFORMANCE STATUS: 1 - Symptomatic but completely ambulatory ? ?Vitals:  ? 06/12/21 1515  ?BP: 129/80  ?Pulse: 65  ?Resp: 20  ?Temp: (!) 97.5 ?F (36.4 ?C)  ?SpO2: 97%  ? ?Filed Weights  ? 06/12/21 1515  ?Weight: 224 lb 12.8 oz (102 kg)  ? ? ?GENERAL: well appearing female in NAD  ?SKIN: skin color, texture, turgor are normal, no rashes or significant lesions ?EYES: conjunctiva are pink and non-injected, sclera clear ?OROPHARYNX: no exudate, no erythema; lips, buccal mucosa, and tongue normal  ?LUNGS: clear to auscultation and percussion with normal breathing effort ?HEART: regular rate & rhythm and no murmurs and no lower extremity edema ?Musculoskeletal: no cyanosis of digits and no  clubbing  ?PSYCH: alert & oriented x 3, fluent speech ?NEURO: no focal motor/sensory deficits ? ?LABORATORY DATA:  ?I have reviewed the data as listed ? ?  Latest Ref Rng & Units 06/12/2021  ?  3:09 PM 04/05/2021

## 2021-06-15 ENCOUNTER — Telehealth: Payer: Self-pay | Admitting: Hematology and Oncology

## 2021-06-15 NOTE — Telephone Encounter (Signed)
Scheduled per 3/29 los, pt has been called and confirmed  ?

## 2021-06-20 DIAGNOSIS — M170 Bilateral primary osteoarthritis of knee: Secondary | ICD-10-CM | POA: Diagnosis not present

## 2021-06-20 DIAGNOSIS — M25561 Pain in right knee: Secondary | ICD-10-CM | POA: Diagnosis not present

## 2021-06-29 DIAGNOSIS — Z124 Encounter for screening for malignant neoplasm of cervix: Secondary | ICD-10-CM | POA: Diagnosis not present

## 2021-06-29 DIAGNOSIS — Z1231 Encounter for screening mammogram for malignant neoplasm of breast: Secondary | ICD-10-CM | POA: Diagnosis not present

## 2021-06-29 DIAGNOSIS — Z01419 Encounter for gynecological examination (general) (routine) without abnormal findings: Secondary | ICD-10-CM | POA: Diagnosis not present

## 2021-06-29 DIAGNOSIS — Z1272 Encounter for screening for malignant neoplasm of vagina: Secondary | ICD-10-CM | POA: Diagnosis not present

## 2021-07-10 LAB — HM MAMMOGRAPHY: HM Mammogram: NORMAL (ref 0–4)

## 2021-07-12 NOTE — Progress Notes (Signed)
? ?Office Visit Note ? ?Patient: [name] [name]             ?Date of Birth: Sep 28, 1960           ?MRN: [id number]             ?PCP: Janith Lima, MD ?Referring: Janith Lima, MD ?Visit Date: 07/13/2021 ? ? ?Subjective:  ?New Patient (Initial Visit) (Abnormal labs) ? ? ?History of Present Illness: [name] [name] is a 61 y.o. female here for evaluation of positive ANA with erythromelalgia and joint pains. She has a history of polycythemia followed in hematology clinic previously found of iron deficiency anemia and then was on IV replacement.  A lot of her symptoms are ongoing for the past few years but he says been worse for the last 2 to 2-1/2 years in particular. Fatigue is persistent and also notices trouble concentrating and sometime word searching difficulty. She lost one job due to difficulty with these. She noticed physical changes with hair thinning on top of her scalp towards the front also diffuse skin dryness and erythema on arm extensor surfaces and on inside half of legs. Redness coming and going on her face and neck most often in the mornings. Sometimes macules or bumps develop on her face not particularly painful or itchy.  She maintains some erythematous or violaceous discoloration on some of her toes but also has episodes with increased swelling redness of the entire foot with sweating and burning and stinging pain.  She has had multiple issues with recurrent swelling in her hand with trigger fingers previously had multiple injections for these. ? ?Labs reviewed ?ANA 1:80 homogenous ?CCP neg ?CRP <1 ?CBC Hgb 17.0 ?BMP CO2 38 ?UA wnl ? ?Activities of Daily Living:  ?Patient reports morning stiffness for 24 hours.   ?Patient Reports nocturnal pain.  ?Difficulty dressing/grooming: Reports ?Difficulty climbing stairs: Reports ?Difficulty getting out of chair: Reports ?Difficulty using hands for taps, buttons, cutlery, and/or writing: Reports ? ?Review of Systems  ?Constitutional:  Positive for  fatigue.  ?HENT:  Positive for mouth dryness.   ?Eyes:  Positive for dryness.  ?Respiratory:  Positive for shortness of breath.   ?Cardiovascular:  Positive for swelling in legs/feet.  ?Gastrointestinal:  Positive for constipation.  ?Endocrine: Positive for heat intolerance and excessive thirst.  ?Genitourinary:  Negative for difficulty urinating.  ?Musculoskeletal:  Positive for joint pain, joint pain, joint swelling, muscle weakness, morning stiffness and muscle tenderness.  ?Skin:  Positive for rash.  ?Allergic/Immunologic: Negative for susceptible to infections.  ?Neurological:  Positive for dizziness and weakness.  ?Hematological:  Negative for bruising/bleeding tendency.  ?Psychiatric/Behavioral:  Negative for sleep disturbance.   ? ?PMFS History:  ?Patient Active Problem List  ? Diagnosis Date Noted  ? ANA positive 05/11/2021  ? Arthralgia 05/08/2021  ? Polycythemia 04/01/2021  ? Obesity (BMI 30.0-34.9) 09/19/2020  ? Iron deficiency anemia 09/19/2020  ? Vitamin B12 deficiency anemia due to intrinsic factor deficiency 09/19/2020  ? Type II diabetes mellitus with manifestations ([place]) 09/19/2020  ? Hyperlipidemia with target LDL less than 130 09/19/2020  ? Erythromelalgia ([place]) 10/24/2019  ? Visit for screening mammogram 12/22/2018  ? Mild episode of recurrent major depressive disorder ([place]) 02/26/2018  ? Vitamin D deficiency disease 01/28/2018  ? GERD with esophagitis 10/29/2017  ? Diuretic-induced hypokalemia 07/03/2015  ? Constipation 07/03/2015  ? Overweight(278.02)   ? Routine health maintenance 12/26/2010  ? MIGRAINE HEADACHE 04/28/2007  ? Essential hypertension 04/28/2007  ? IRRITABLE BOWEL SYNDROME 04/28/2007  ?  SPONDYLOSIS, CERVICAL, WITH RADICULOPATHY 04/28/2007  ?  ?Past Medical History:  ?Diagnosis Date  ? Anemia   ? Anxiety   ? Bigeminy   ? Cognitive changes   ? Depression   ? Dysrhythmia   ? bigeminy  ? Elevated liver enzymes 2017  ? undiagnosed and levels recently were more normal per pt  ?  Erythromelalgia ([place])   ? Hyperlipidemia   ? Hypertension   ? IBS (irritable bowel syndrome)   ? Migraine, unspecified, without mention of intractable migraine without mention of status migrainosus   ? PONV (postoperative nausea and vomiting)   ? Tachycardia, unspecified   ? on BBloc  ?  ?Family History  ?Problem Relation Age of Onset  ? Cancer Mother   ?     breast cancer-left mastectomy  ? Asthma Mother   ? Alzheimer's disease Mother   ? Transient ischemic attack Mother   ? Other Mother   ?     ITP  ? Hypertension Father   ? Hyperlipidemia Father   ? Diabetes Father   ? Heart attack Father 98  ?     CABG  ? Heart failure Father   ? Pancreatitis Father   ? Chronic fatigue Sister   ? Fibromyalgia Sister   ? Alzheimer's disease Maternal Grandmother   ? Breast cancer Maternal Grandmother   ? ?Past Surgical History:  ?Procedure Laterality Date  ? Bilateral myofascial release for recurrent plantar faciitis    ? BLADDER SUSPENSION N/A 08/09/2015  ? Procedure: TRANSVAGINAL TAPE (TVT) PROCEDURE;  Surgeon: Bobbye Charleston, MD;  Location: [facility];  Service: Gynecology;  Laterality: N/A;  ? CARPAL TUNNEL RELEASE    ? Colonoscopy with polypectomy  03/18/2014  ? Repeat 2021  ? CYSTOCELE REPAIR  08/09/2015  ? Procedure: ANTERIOR REPAIR (CYSTOCELE);  Surgeon: Bobbye Charleston, MD;  Location: [facility];  Service: Gynecology;;  ? CYSTOSCOPY N/A 08/09/2015  ? Procedure: CYSTOSCOPY;  Surgeon: Bobbye Charleston, MD;  Location: [facility];  Service: Gynecology;  Laterality: N/A;  ? GIVENS CAPSULE STUDY N/A 10/24/2020  ? Procedure: GIVENS CAPSULE STUDY;  Surgeon: Juanita Craver, MD;  Location: [facility]COPY;  Service: Endoscopy;  Laterality: N/A;  ? ROBOTIC ASSISTED TOTAL HYSTERECTOMY WITH BILATERAL SALPINGO OOPHERECTOMY Bilateral 08/09/2015  ? Procedure: ROBOTIC ASSISTED TOTAL HYSTERECTOMY WITH BILATERAL SALPINGO OOPHORECTOMY;  Surgeon: Bobbye Charleston, MD;  Location: [facility];  Service: Gynecology;  Laterality: Bilateral;  ? TRIGGER FINGER  RELEASE    ? TUBAL LIGATION    ? ?Social History  ? ?Social History Narrative  ? HSG. 1 year [employer]. Married '86-divorced after 67yr; remarried  '96. No children, 2 step children. 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 Marriage is stable.  ?   ? Lives at home with husband.  ? Right-handed.  ? One Diet Coke per day, 2 cups coffee per day.  ? ?Immunization History  ?Administered Date(s) Administered  ? H1N1 02/25/2008  ? Hep A / Hep B 08/31/2015, 01/29/2016  ? Hepatitis B, adult 11/21/2015  ? Influenza Whole 12/17/2007, 12/13/2008, 12/15/2009  ? Influenza,inj,Quad PF,6+ Mos 01/05/2015, 01/29/2016, 12/19/2016, 01/28/2018, 12/22/2018, 11/10/2020  ? Influenza-Unspecified 12/19/2014, 12/17/2015  ? Janssen (J&J) SARS-COV-2 Vaccination 06/22/2019  ? Td 12/17/2007, 12/13/2008  ? Tdap 07/08/2019  ? Zoster Recombinat (Shingrix) 12/22/2018, 04/23/2019  ?  ? ?Objective: ?Vital Signs: BP (!) 149/87 (BP Location: Right Arm, Patient Position: Sitting, Cuff Size: Normal)   Pulse 68   Resp 16   Ht  $'5\' 5"'q$  (1.651 m)   Wt 226 lb (102.5 kg)   LMP  (LMP Unknown)   BMI 37.61 kg/m?   ? ?Physical Exam ?HENT:  ?   Right Ear: External ear normal.  ?   Left Ear: External ear normal.  ?   Mouth/Throat:  ?   Mouth: Mucous membranes are moist.  ?   Pharynx: Oropharynx is clear.  ?Eyes:  ?   Conjunctiva/sclera: Conjunctivae normal.  ?Cardiovascular:  ?   Rate and Rhythm: Normal rate and regular rhythm.  ?Pulmonary:  ?   Effort: Pulmonary effort is normal.  ?   Breath sounds: Normal breath sounds.  ?Musculoskeletal:  ?   Right lower leg: No edema.  ?   Left lower leg: No edema.  ?Skin: ?   General: Skin is warm and dry.  ?   Comments: Dry skin throughout ?Redness and violaceous discoloration in toes of both feet, bruising on dorsum of right foot proximal to 3rd-5th toes  ?Neurological:  ?   Mental Status: She is alert.  ?Psychiatric:     ?   Mood and Affect: Mood normal.  ?   ? ?Musculoskeletal Exam:  ?Shoulders full ROM no tenderness or swelling ?Elbows full ROM no tenderness or swelling ?Wrists full ROM no tenderness or swelling ?Fingers mild heberdon's nodes present, decreased flexion ROM active or passive

## 2021-07-13 ENCOUNTER — Ambulatory Visit (INDEPENDENT_AMBULATORY_CARE_PROVIDER_SITE_OTHER): Payer: BC Managed Care – PPO | Admitting: Internal Medicine

## 2021-07-13 ENCOUNTER — Encounter: Payer: Self-pay | Admitting: Internal Medicine

## 2021-07-13 VITALS — BP 149/87 | HR 68 | Resp 16 | Ht 65.0 in | Wt 226.0 lb

## 2021-07-13 DIAGNOSIS — D751 Secondary polycythemia: Secondary | ICD-10-CM | POA: Diagnosis not present

## 2021-07-13 DIAGNOSIS — E118 Type 2 diabetes mellitus with unspecified complications: Secondary | ICD-10-CM

## 2021-07-13 DIAGNOSIS — I7381 Erythromelalgia: Secondary | ICD-10-CM

## 2021-07-13 DIAGNOSIS — R768 Other specified abnormal immunological findings in serum: Secondary | ICD-10-CM

## 2021-07-16 LAB — SJOGRENS SYNDROME-A EXTRACTABLE NUCLEAR ANTIBODY: SSA (Ro) (ENA) Antibody, IgG: <1 AI

## 2021-07-16 LAB — C3 AND C4
C3 Complement: 147 mg/dL (ref 83–193)
C4 Complement: 24 mg/dL (ref 15–57)

## 2021-07-16 LAB — SJOGRENS SYNDROME-B EXTRACTABLE NUCLEAR ANTIBODY: SSB (La) (ENA) Antibody, IgG: <1 AI

## 2021-07-16 LAB — SEDIMENTATION RATE: Sed Rate: 2 mm/h (ref 0–30)

## 2021-07-16 LAB — ANTI-DNA ANTIBODY, DOUBLE-STRANDED: ds DNA Ab: <1 IU/mL

## 2021-07-16 LAB — ANTI-SMITH ANTIBODY: ENA SM Ab Ser-aCnc: <1 AI

## 2021-07-16 LAB — RNP ANTIBODY: Ribonucleic Protein(ENA) Antibody, IgG: <1 AI

## 2021-07-23 ENCOUNTER — Telehealth: Payer: Self-pay

## 2021-07-23 ENCOUNTER — Encounter (HOSPITAL_COMMUNITY): Payer: Self-pay | Admitting: Emergency Medicine

## 2021-07-23 ENCOUNTER — Emergency Department (HOSPITAL_COMMUNITY)
Admission: EM | Admit: 2021-07-23 | Discharge: 2021-07-23 | Disposition: A | Discharge status: Home / Self Care | Payer: BC Managed Care – PPO | Attending: Emergency Medicine | Admitting: Emergency Medicine

## 2021-07-23 ENCOUNTER — Other Ambulatory Visit: Payer: Self-pay

## 2021-07-23 ENCOUNTER — Emergency Department (HOSPITAL_COMMUNITY): Payer: BC Managed Care – PPO

## 2021-07-23 DIAGNOSIS — Z7982 Long term (current) use of aspirin: Secondary | ICD-10-CM | POA: Insufficient documentation

## 2021-07-23 DIAGNOSIS — R110 Nausea: Secondary | ICD-10-CM | POA: Insufficient documentation

## 2021-07-23 DIAGNOSIS — K760 Fatty (change of) liver, not elsewhere classified: Secondary | ICD-10-CM | POA: Diagnosis not present

## 2021-07-23 DIAGNOSIS — R109 Unspecified abdominal pain: Secondary | ICD-10-CM

## 2021-07-23 DIAGNOSIS — R1011 Right upper quadrant pain: Secondary | ICD-10-CM | POA: Diagnosis not present

## 2021-07-23 LAB — URINALYSIS, ROUTINE W REFLEX MICROSCOPIC
Bilirubin Urine: NEGATIVE
Glucose, UA: NEGATIVE mg/dL
Hgb urine dipstick: NEGATIVE
Ketones, ur: NEGATIVE mg/dL
Leukocytes,Ua: NEGATIVE
Nitrite: NEGATIVE
Protein, ur: NEGATIVE mg/dL
Specific Gravity, Urine: 1.005 (ref 1.005–1.030)
pH: 7 (ref 5.0–8.0)

## 2021-07-23 LAB — COMPREHENSIVE METABOLIC PANEL
ALT: 33 U/L (ref 0–44)
AST: 25 U/L (ref 15–41)
Albumin: 3.9 g/dL (ref 3.5–5.0)
Alkaline Phosphatase: 58 U/L (ref 38–126)
Anion gap: 7 (ref 5–15)
BUN: 12 mg/dL (ref 6–20)
CO2: 26 mmol/L (ref 22–32)
Calcium: 9.7 mg/dL (ref 8.9–10.3)
Chloride: 104 mmol/L (ref 98–111)
Creatinine, Ser: 0.82 mg/dL (ref 0.44–1.00)
GFR, Estimated: >60 mL/min (ref >60)
Glucose, Bld: 105 mg/dL — ABNORMAL HIGH (ref 70–99)
Potassium: 3.8 mmol/L (ref 3.5–5.1)
Sodium: 137 mmol/L (ref 135–145)
Total Bilirubin: 0.6 mg/dL (ref 0.3–1.2)
Total Protein: 6.9 g/dL (ref 6.5–8.1)

## 2021-07-23 LAB — CBC
HCT: 52 % — ABNORMAL HIGH (ref 36.0–46.0)
Hemoglobin: 17.6 g/dL — ABNORMAL HIGH (ref 12.0–15.0)
MCH: 29.9 pg (ref 26.0–34.0)
MCHC: 33.8 g/dL (ref 30.0–36.0)
MCV: 88.3 fL (ref 80.0–100.0)
Platelets: 309 10*3/uL (ref 150–400)
RBC: 5.89 MIL/uL — ABNORMAL HIGH (ref 3.87–5.11)
RDW: 13.2 % (ref 11.5–15.5)
WBC: 8.1 10*3/uL (ref 4.0–10.5)
nRBC: 0 % (ref 0.0–0.2)

## 2021-07-23 LAB — LIPASE, BLOOD: Lipase: 38 U/L (ref 11–51)

## 2021-07-23 MED ORDER — ONDANSETRON 4 MG PO TBDP: 4.0000 mg | ORAL_TABLET | Freq: Three times a day (TID) | ORAL | 0 refills | Status: DC | PRN

## 2021-07-23 MED ORDER — ONDANSETRON 4 MG PO TBDP
4.0000 mg | ORAL_TABLET | Freq: Once | ORAL | Status: AC
  Administered 2021-07-23: 4 mg via ORAL
  Filled 2021-07-23: qty 1

## 2021-07-23 MED ORDER — METHOCARBAMOL 500 MG PO TABS
750.0000 mg | ORAL_TABLET | Freq: Once | ORAL | Status: AC
  Administered 2021-07-23: 750 mg via ORAL
  Filled 2021-07-23: qty 2

## 2021-07-23 MED ORDER — METHOCARBAMOL 500 MG PO TABS: 500.0000 mg | ORAL_TABLET | Freq: Two times a day (BID) | ORAL | 0 refills | Status: DC

## 2021-07-23 MED ORDER — ACETAMINOPHEN 325 MG PO TABS
650.0000 mg | ORAL_TABLET | Freq: Four times a day (QID) | ORAL | Status: DC | PRN
  Administered 2021-07-23: 650 mg via ORAL
  Filled 2021-07-23: qty 2

## 2021-07-23 NOTE — Discharge Instructions (Addendum)
Please continue to follow-up with your primary care doctor and rheumatologist. ?Take your medications as prescribed.  Please continue to take omeprazole.  You may use the muscle relaxer Robaxin I have prescribed you for pain I recommend using Tylenol primarily you may use this with Aleve as needed.  Please monitor your symptoms if you have any new or concerning symptoms such as fever or vomiting please return to the ER for reevaluation.  I have prescribed you Zofran for nausea should you experience any. ?

## 2021-07-23 NOTE — Telephone Encounter (Signed)
Patient called stating she thinks her liver may be swollen.  Patient states she has swelling in her abdomen.  Patient states she is scheduled for follow-up appointment on 07/30/21 and wanted to know if she needs to schedule an earlier appointment.   ?

## 2021-07-23 NOTE — ED Provider Notes (Signed)
?[facility] ?Provider Note ? ? ?CSN: [id number] ?Arrival date & time: 07/23/21  1125 ? ?  ? ?History ? ?Chief Complaint  ?Patient presents with  ? Abdominal Pain  ? ? ?[name] [name] is a 61 y.o. female. ? ? ?Abdominal Pain ? ?Patient is a 61 year old female with past medical history significant for migraine headaches, irritable bowel syndrome, obesity, constipation, depression, arthralgias ? ?Patient explains to me that she has been having some right upper quadrant abdominal pain seems that over the past 3 weeks it has been bothering her however she states that it has been ongoing for at least 8 weeks.  She endorses some occasional episodes of nausea without vomiting.  She states that she has some looser stools but no frank diarrhea.  No blood in her stools no frequency urgency or dysuria with urination. ? ?She explains to me that she has a history of high erythrocyte count that she is concerned about. ? ?  ? ?Home Medications ?Prior to Admission medications   ?Medication Sig Start Date End Date Taking? Authorizing Provider  ?methocarbamol (ROBAXIN) 500 MG tablet Take 1 tablet (500 mg total) by mouth 2 (two) times daily. 07/23/21  Yes Demaryius Imran, Ova Freshwater S, PA  ?ondansetron (ZOFRAN-ODT) 4 MG disintegrating tablet Take 1 tablet (4 mg total) by mouth every 8 (eight) hours as needed for nausea or vomiting. 07/23/21  Yes Pati Gallo S, PA  ?aspirin EC 81 MG tablet Take 81 mg by mouth daily. Swallow whole.    [provider]  ?atorvastatin (LIPITOR) 10 MG tablet TAKE 1 TABLET DAILY 06/04/21   Janith Lima, MD  ?buPROPion (WELLBUTRIN XL) 150 MG 24 hr tablet TAKE 1 TABLET(150 MG) BY MOUTH DAILY 05/08/21   Janith Lima, MD  ?Carboxymethylcellulose Sodium (EYE DROPS OP) Place 1 drop into both eyes daily. replens    [provider]  ?DULoxetine (CYMBALTA) 60 MG capsule Take 1 capsule (60 mg total) by mouth daily. 05/08/21   Janith Lima, MD  ?estradiol (ESTRACE) 1 MG  tablet Take 1 mg by mouth daily.    [provider]  ?indapamide (LOZOL) 1.25 MG tablet TAKE 1 TABLET DAILY 05/08/21   Janith Lima, MD  ?metoprolol tartrate (LOPRESSOR) 25 MG tablet TAKE 1 TABLET(25 MG) BY MOUTH TWICE DAILY 05/08/21   Janith Lima, MD  ?Naproxen Sodium (ALEVE PO) Take 2 tablets by mouth as needed.    [provider]  ?omeprazole (PRILOSEC) 20 MG capsule Take 20 mg by mouth daily.    [provider]  ?   ? ?Allergies    ?Enalapril, Codeine, and Macrobid [nitrofurantoin]   ? ?Review of Systems   ?Review of Systems  ?Gastrointestinal:  Positive for abdominal pain.  ? ?Physical Exam ?Updated Vital Signs ?BP (!) 144/78   Pulse 66   Temp 97.8 ?F (36.6 ?C) (Oral)   Resp 18   Ht '5\' 5"'$  (1.651 m)   Wt 102.5 kg   LMP  (LMP Unknown)   SpO2 95%   BMI 37.60 kg/m?  ?Physical Exam ?Vitals and nursing note reviewed.  ?Constitutional:   ?   General: She is not in acute distress. ?   Appearance: She is obese.  ?   Comments: Pleasant well-appearing 61 year old.  In no acute distress.  Sitting comfortably in bed.  Able answer questions appropriately follow commands. No increased work of breathing. Speaking in full sentences.   ?HENT:  ?   Head: Normocephalic and atraumatic.  ?  Nose: Nose normal.  ?Eyes:  ?   General: No scleral icterus. ?Cardiovascular:  ?   Rate and Rhythm: Normal rate and regular rhythm.  ?   Pulses: Normal pulses.  ?   Heart sounds: Normal heart sounds.  ?Pulmonary:  ?   Effort: Pulmonary effort is normal. No respiratory distress.  ?   Breath sounds: No wheezing.  ?Abdominal:  ?   Palpations: Abdomen is soft.  ?   Tenderness: There is abdominal tenderness in the right upper quadrant.  ?   Comments: Very mild tenderness in the right upper quadrant.  No guarding or rebound.  Negative Murphy sign.  ?Musculoskeletal:  ?   Cervical back: Normal range of motion.  ?   Right lower leg: No edema.  ?   Left lower leg: No edema.  ?Skin: ?   General: Skin is warm and  dry.  ?   Capillary Refill: Capillary refill takes less than 2 seconds.  ?Neurological:  ?   Mental Status: She is alert. Mental status is at baseline.  ?Psychiatric:     ?   Mood and Affect: Mood normal.     ?   Behavior: Behavior normal.  ? ? ?ED Results / Procedures / Treatments   ?Labs ?(all labs ordered are listed, but only abnormal results are displayed) ?Labs Reviewed  ?COMPREHENSIVE METABOLIC PANEL - Abnormal; Notable for the following components:  ?    Result Value  ? Glucose, Bld 105 (*)   ? All other components within normal limits  ?CBC - Abnormal; Notable for the following components:  ? RBC 5.89 (*)   ? Hemoglobin 17.6 (*)   ? HCT 52.0 (*)   ? All other components within normal limits  ?LIPASE, BLOOD  ?URINALYSIS, ROUTINE W REFLEX MICROSCOPIC  ? ? ?EKG ?None ? ?Radiology ?US Abdomen Limited RUQ (LIVER/GB) ? ?Result Date: 07/23/2021 ?CLINICAL DATA:  Right upper quadrant abdominal pain. EXAM: ULTRASOUND ABDOMEN LIMITED RIGHT UPPER QUADRANT COMPARISON:  None Available. FINDINGS: Gallbladder: No gallstones or wall thickening visualized. No sonographic Murphy sign noted by sonographer. Common bile duct: Diameter: 5 mm Liver: The liver demonstrates coarse echotexture and increased echogenicity, likely reflecting diffuse steatosis. No overt cirrhotic contour abnormalities or focal lesions are identified. There is no evidence of intrahepatic biliary ductal dilatation. Portal vein is patent on color Doppler imaging with normal direction of blood flow towards the liver. Other: None. IMPRESSION: Evidence of hepatic steatosis. Electronically Signed   By: Aletta Edouard M.D.   On: 07/23/2021 12:56   ? ?Procedures ?Procedures  ? ? ?Medications Ordered in ED ?Medications  ?acetaminophen (TYLENOL) tablet 650 mg (650 mg Oral Given 07/23/21 1233)  ?ondansetron (ZOFRAN-ODT) disintegrating tablet 4 mg (4 mg Oral Given 07/23/21 1233)  ?methocarbamol (ROBAXIN) tablet 750 mg (750 mg Oral Given 07/23/21 1233)  ? ? ?ED Course/  Medical Decision Making/ A&P ?Clinical Course as of 07/23/21 1335  ?Mon Jul 23, 2021  ?1226 Some RUQ pain for 8 weeks with worsening over the past 3 weeks. Some nausea without vomiting. Some looser stool without diarrhea.  [WF]  ?  ?Clinical Course User Index ?[WF] Tedd Sias, [place]  ? ?                        ?Medical Decision Making ?Amount and/or Complexity of Data Reviewed ?Labs: ordered. ?Radiology: ordered. ? ?Risk ?OTC drugs. ?Prescription drug management. ? ? ?Patient is a 61 year old female with past medical history significant  for migraine headaches, irritable bowel syndrome, obesity, constipation, depression, arthralgias ? ?Patient explains to me that she has been having some right upper quadrant abdominal pain seems that over the past 3 weeks it has been bothering her however she states that it has been ongoing for at least 8 weeks.  She endorses some occasional episodes of nausea without vomiting.  She states that she has some looser stools but no frank diarrhea.  No blood in her stools no frequency urgency or dysuria with urination. ? ?She explains to me that she has a history of high erythrocyte count and that she is concerned about this. It seems she is being worked up for this by rheum.  ?It seems to be quite a thorough work-up as well including evaluation for a JAK2 mutation which was negative. ? ? ?I personally reviewed all laboratory work and imaging. ? ?Metabolic panel without any acute abnormality specifically kidney function within normal limits and no significant electrolyte abnormalities. ?CBC without leukocytosis or significant anemia.  ?Lipase within normal limits urinalysis unremarkable no transaminitis ? ?I personally reviewed all labs and imaging. ? ?I looked at the images from the right upper quadrant ultrasound I do not appreciate any evidence of cholecystitis.  Radiology mentions hepatic steatosis.  ? ?IMPRESSION:  ?Evidence of hepatic steatosis.  ? ?Provided patient with 1 dose  of Robaxin, 1 dose of Tylenol and Zofran for mild nausea she is experiencing ? ?On my reevaluation patient states that she is feeling much improved.  On my reexamination of her she is no longer has any abdominal tendernes

## 2021-07-23 NOTE — Progress Notes (Signed)
Office Visit Note  Patient: [name] [name]             Date of Birth: [birth year]           MRN: [id number]             PCP: Etta Grandchild, MD Referring: Etta Grandchild, MD Visit Date: 07/30/2021   Subjective:  Follow-up (No improvement)   History of Present Illness: [name] [name] is a 61 y.o. female here for follow up for positive ANA with erythromelalgia and joint pains.  Since her last visit she had a hospital visit for abdominal pain and swelling laboratory tests were unremarkable and right upper quadrant ultrasound showed likely diffuse hepatic steatosis otherwise normal and with no gallbladder changes.  Her symptoms remain about the same the biggest issue is the severe fatigue which she feels like is pretty debilitating and limiting her ability to work.  She has a lot of stress concerned about losing her job from her symptoms interfering with productivity and missing work. She continues to have episodic discoloration, burning, and swelling symptoms affecting hands and feet.  Previous HPI 07/13/2021 [name] [name] is a 61 y.o. female here for evaluation of positive ANA with erythromelalgia and joint pains. She has a history of polycythemia followed in hematology clinic previously found of iron deficiency anemia and then was on IV replacement.  A lot of her symptoms are ongoing for the past few years but he says been worse for the last 2 to 2-1/2 years in particular. Fatigue is persistent and also notices trouble concentrating and sometime word searching difficulty. She lost one job due to difficulty with these. She noticed physical changes with hair thinning on top of her scalp towards the front also diffuse skin dryness and erythema on arm extensor surfaces and on inside half of legs. Redness coming and going on her face and neck most often in the mornings. Sometimes macules or bumps develop on her face not particularly painful or itchy.  She maintains some erythematous or violaceous  discoloration on some of her toes but also has episodes with increased swelling redness of the entire foot with sweating and burning and stinging pain.  She has had multiple issues with recurrent swelling in her hand with trigger fingers previously had multiple injections for these.   Labs reviewed ANA 1:80 homogenous CCP neg CRP <1 CBC Hgb 17.0 BMP CO2 38 UA wnl   Review of Systems  Constitutional:  Positive for fatigue.  HENT:  Positive for mouth dryness.   Eyes:  Positive for dryness.  Respiratory:  Positive for cough and shortness of breath.   Cardiovascular:  Positive for swelling in legs/feet.  Gastrointestinal:  Positive for constipation and diarrhea.  Endocrine: Positive for heat intolerance, excessive thirst and increased urination.  Genitourinary:  Negative for difficulty urinating.  Musculoskeletal:  Positive for joint pain, gait problem, joint pain, joint swelling, muscle weakness, morning stiffness and muscle tenderness.  Skin:  Positive for rash.  Allergic/Immunologic: Negative for susceptible to infections.  Neurological:  Positive for weakness.  Hematological:  Negative for bruising/bleeding tendency.  Psychiatric/Behavioral:  Positive for sleep disturbance.    PMFS History:  Patient Active Problem List   Diagnosis Date Noted   ANA positive 05/11/2021   Arthralgia 05/08/2021   Polycythemia 04/01/2021   Obesity (BMI 30.0-34.9) 09/19/2020   Iron deficiency anemia 09/19/2020   Vitamin B12 deficiency anemia due to intrinsic factor deficiency 09/19/2020   Type II diabetes mellitus with  manifestations (HCC) 09/19/2020   Hyperlipidemia with target LDL less than 130 09/19/2020   Erythromelalgia (HCC) 10/24/2019   Visit for screening mammogram 12/22/2018   Mild episode of recurrent major depressive disorder (HCC) 02/26/2018   Vitamin D deficiency disease 01/28/2018   GERD with esophagitis 10/29/2017   Diuretic-induced hypokalemia 07/03/2015   Constipation 07/03/2015    Overweight(278.02)    Routine health maintenance 12/26/2010   MIGRAINE HEADACHE 04/28/2007   Essential hypertension 04/28/2007   IRRITABLE BOWEL SYNDROME 04/28/2007   SPONDYLOSIS, CERVICAL, WITH RADICULOPATHY 04/28/2007    Past Medical History:  Diagnosis Date   Anemia    Anxiety    Bigeminy    Cognitive changes    Depression    Dysrhythmia    bigeminy   Elevated liver enzymes 2017   undiagnosed and levels recently were more normal per pt   Erythromelalgia (HCC)    Hyperlipidemia    Hypertension    IBS (irritable bowel syndrome)    Migraine, unspecified, without mention of intractable migraine without mention of status migrainosus    PONV (postoperative nausea and vomiting)    Tachycardia, unspecified    on BBloc    Family History  Problem Relation Age of Onset   Cancer Mother        breast cancer-left mastectomy   Asthma Mother    Alzheimer's disease Mother    Transient ischemic attack Mother    Other Mother        ITP   Hypertension Father    Hyperlipidemia Father    Diabetes Father    Heart attack Father 61       CABG   Heart failure Father    Pancreatitis Father    Chronic fatigue Sister    Fibromyalgia Sister    Alzheimer's disease Maternal Grandmother    Breast cancer Maternal Grandmother    Past Surgical History:  Procedure Laterality Date   Bilateral myofascial release for recurrent plantar faciitis     BLADDER SUSPENSION N/A 08/09/2015   Procedure: TRANSVAGINAL TAPE (TVT) PROCEDURE;  Surgeon: Carrington Clamp, MD;  Location: [facility];  Service: Gynecology;  Laterality: N/A;   CARPAL TUNNEL RELEASE     Colonoscopy with polypectomy  03/18/2014   Repeat 2021   CYSTOCELE REPAIR  08/09/2015   Procedure: ANTERIOR REPAIR (CYSTOCELE);  Surgeon: Carrington Clamp, MD;  Location: [facility];  Service: Gynecology;;   CYSTOSCOPY N/A 08/09/2015   Procedure: Bluford Kaufmann;  Surgeon: Carrington Clamp, MD;  Location: [facility];  Service: Gynecology;  Laterality: N/A;    GIVENS CAPSULE STUDY N/A 10/24/2020   Procedure: GIVENS CAPSULE STUDY;  Surgeon: Charna Elizabeth, MD;  Location[facility] END[facility];  Service: Endoscopy;  Laterality: N/A;   ROBOTIC ASSISTED TOTAL HYSTERECTOMY WITH BILATERAL SALPINGO OOPHERECTOMY Bilateral 08/09/2015   Procedure: ROBOTIC ASSISTED TOTAL HYSTERECTOMY WITH BILATERAL SALPINGO OOPHORECTOMY;  Surgeon: Carrington Clamp, MD;  Location: [facility];  Service: Gynecology;  Laterality: Bilateral;   TRIGGER FINGER RELEASE     TUBAL LIGATION     Social History   Social History Narrative   HSG. 1 year [employer] [place]. Married '86-divorced after 57yrs; remarried  '96. No children, 2 step children. 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 Marriage is stable.      Lives at home with husband.   Right-handed.   One Diet Coke per day, 2 cups coffee per day.   Immunization History  Administered Date(s) Administered   H1N1 02/25/2008   Hep A / Hep  B 08/31/2015, 01/29/2016   Hepatitis B, adult 11/21/2015   Influenza Whole 12/17/2007, 12/13/2008, 12/15/2009   Influenza,inj,Quad PF,6+ Mos 01/05/2015, 01/29/2016, 12/19/2016, 01/28/2018, 12/22/2018, 11/10/2020   Influenza-Unspecified 12/19/2014, 12/17/2015   Janssen (J&J) SARS-COV-2 Vaccination 06/22/2019   Td 12/17/2007, 12/13/2008   Tdap 07/08/2019   Zoster Recombinat (Shingrix) 12/22/2018, 04/23/2019     Objective: Vital Signs: BP 129/78 (BP Location: Left Arm, Patient Position: Sitting, Cuff Size: Normal)   Pulse 70   Resp 16   Ht 5\' 5"  (1.651 m)   Wt 224 lb (101.6 kg)   LMP  (LMP Unknown)   BMI 37.28 kg/m    Physical Exam Skin:    General: Skin is warm and dry.     Comments: Mild violaceous discoloration in toes of both feet and fingertips  Neurological:     Mental Status: She is alert.  Psychiatric:     Comments: Nearly tearful describing anxiety about symptom related loss of work and limitation on daily activities     Musculoskeletal Exam:   Shoulders full ROM no tenderness or swelling Elbows full ROM no tenderness or swelling Wrists full ROM no tenderness or swelling Fingers with heberdon's nodes present slightly decreased right 3rd digit ROM Knees full ROM no tenderness or swelling Ankles full ROM no tenderness or swelling   Investigation: No additional findings.  Imaging: US Abdomen Limited RUQ (LIVER/GB)  Result Date: 07/23/2021 CLINICAL DATA:  Right upper quadrant abdominal pain. EXAM: ULTRASOUND ABDOMEN LIMITED RIGHT UPPER QUADRANT COMPARISON:  None Available. FINDINGS: Gallbladder: No gallstones or wall thickening visualized. No sonographic Murphy sign noted by sonographer. Common bile duct: Diameter: 5 mm Liver: The liver demonstrates coarse echotexture and increased echogenicity, likely reflecting diffuse steatosis. No overt cirrhotic contour abnormalities or focal lesions are identified. There is no evidence of intrahepatic biliary ductal dilatation. Portal vein is patent on color Doppler imaging with normal direction of blood flow towards the liver. Other: None. IMPRESSION: Evidence of hepatic steatosis. Electronically Signed   By: Irish Lack M.D.   On: 07/23/2021 12:56    Recent Labs: Lab Results  Component Value Date   WBC 8.1 07/23/2021   HGB 17.6 (H) 07/23/2021   PLT 309 07/23/2021   NA 137 07/23/2021   K 3.8 07/23/2021   CL 104 07/23/2021   CO2 26 07/23/2021   GLUCOSE 105 (H) 07/23/2021   BUN 12 07/23/2021   CREATININE 0.82 07/23/2021   BILITOT 0.6 07/23/2021   ALKPHOS 58 07/23/2021   AST 25 07/23/2021   ALT 33 07/23/2021   PROT 6.9 07/23/2021   ALBUMIN 3.9 07/23/2021   CALCIUM 9.7 07/23/2021   GFRAA >60 01/30/2016    Speciality Comments: No specialty comments available.  Procedures:  No procedures performed Allergies: Enalapril, Codeine, and Macrobid [nitrofurantoin]   Assessment / Plan:     Visit Diagnoses: ANA positive - Positive ANA she previously had negative labs and 2019.   Erythromelalgia (HCC)  Lab work-up was unremarkable and nonspecific clinical criteria so I do not believe she has systemic lupus or undifferentiated connective tissue disease also less likely.  With known polycythemia be most suspicious for this as secondary cause of erythromelalgia symptoms.  Recommend trial of increase to full dose aspirin 325 mg daily during the next 2 weeks see if there is improvement in her daily symptoms.  If she is not seeing a benefit to this would recommend a short trial of oral prednisone to assess symptom response.  Type II diabetes mellitus with manifestations (HCC) -  History of type 2 diabetes but does not have very clear peripheral neuropathy that would explain her peripheral changes.  Discussed trial of prednisone would stick with moderate to low-dose for limited timeframe to avoid worsening of health problems.  Polycythemia  I think she needs to be evaluated for possible sleep apnea.  The constellation of elevated hemoglobin, some acidosis on metabolic panel, and her severe daily fatigue and nonrestorative sleep would be suspicious for this.  She does not describe nighttime awakening or history of severe snoring.  We will also message with her hematologist if they had very strong suspicious of primary hematology disorder can recommend waiting to follow-up there first.   Discussion of ongoing symptoms and possible treatment options or diagnostic trial at least 30 minutes spent in face to face encounter time and coordination of care work.   Follow-Up Instructions: Return in about 4 weeks (around 08/27/2021) for erythromelalgia ?CTD f/u 4wks.   Fuller Plan, MD  Note - This record has been created using [employer].  Chart creation errors have been sought, but may not always  have been located. Such creation errors do not reflect on  the standard of medical care.

## 2021-07-23 NOTE — ED Triage Notes (Signed)
Pt reports she has been seeing her PCP and been telling him for years that she was feeling off. Pt found out she was anemic. Pt endorses right side swelling and pain. Pt worried about liver issues.  ?

## 2021-07-23 NOTE — Telephone Encounter (Deleted)
Patient j+ ?

## 2021-07-23 NOTE — ED Notes (Signed)
Patient verbalizes understanding of discharge instructions. Opportunity for questioning and answers were provided. Armband removed by staff, pt discharged from ED. Pt taken to ED waiting room via wheel chair.  

## 2021-07-26 NOTE — Telephone Encounter (Signed)
Our scheduled follow up should be okay. She had a liver ultrasound on Monday that showed probably fatty liver disease but no particular acute problem. Lab tests also looked okay.

## 2021-07-26 NOTE — Telephone Encounter (Signed)
Patient advised our scheduled follow up should be okay. She had a liver ultrasound on Monday that showed probably fatty liver disease but no particular acute problem. Lab tests also looked okay. Patient expressed understanding.  ?

## 2021-07-30 ENCOUNTER — Ambulatory Visit (INDEPENDENT_AMBULATORY_CARE_PROVIDER_SITE_OTHER): Payer: BC Managed Care – PPO | Admitting: Internal Medicine

## 2021-07-30 ENCOUNTER — Encounter: Payer: Self-pay | Admitting: Internal Medicine

## 2021-07-30 VITALS — BP 129/78 | HR 70 | Resp 16 | Ht 65.0 in | Wt 224.0 lb

## 2021-07-30 DIAGNOSIS — E118 Type 2 diabetes mellitus with unspecified complications: Secondary | ICD-10-CM

## 2021-07-30 DIAGNOSIS — D751 Secondary polycythemia: Secondary | ICD-10-CM | POA: Diagnosis not present

## 2021-07-30 DIAGNOSIS — I7381 Erythromelalgia: Secondary | ICD-10-CM | POA: Diagnosis not present

## 2021-07-30 DIAGNOSIS — R768 Other specified abnormal immunological findings in serum: Secondary | ICD-10-CM | POA: Diagnosis not present

## 2021-07-30 NOTE — Patient Instructions (Signed)
I recommend trying increase aspirin to 325 mg daily for next 1-2 weeks and see if there is a difference in symptoms please contact us if not seeing a significant difference. ?

## 2021-07-31 ENCOUNTER — Encounter: Payer: Self-pay | Admitting: Physician Assistant

## 2021-07-31 ENCOUNTER — Other Ambulatory Visit: Payer: Self-pay | Admitting: Internal Medicine

## 2021-07-31 DIAGNOSIS — I10 Essential (primary) hypertension: Secondary | ICD-10-CM

## 2021-08-01 ENCOUNTER — Telehealth: Payer: Self-pay | Admitting: Physician Assistant

## 2021-08-01 NOTE — Telephone Encounter (Signed)
I spoke to [name] by phone. She voiced her frustrations regarding her ongoing symptoms for the last 3 years. She reports having chronic debilitating fatigue, joint pain, hot flashes and night sweats.I reviewed prior labs that show modest but stable increase in hemoglobin levels without any driver mutations (negative MPN panel) and normal EPO levels. She has secondary polycythemia so there is no indication for cytoreductive medications or phlebotomies. Recommended a sleep study to check for obstructive sleep apnea although it doesn't explain most of her ongoing symptoms. She has requested to defer the sleep study at this time. She is scheduled to have a follow up with her PCP, Dr. Ronnald [name], next week. She plans to discuss her ongoing symptoms with him. Dr. Lorenso [name] will see her back in clinic in September 2023.  ?

## 2021-08-03 ENCOUNTER — Other Ambulatory Visit: Payer: Self-pay | Admitting: *Deleted

## 2021-08-03 DIAGNOSIS — R5382 Chronic fatigue, unspecified: Secondary | ICD-10-CM

## 2021-08-06 ENCOUNTER — Ambulatory Visit (INDEPENDENT_AMBULATORY_CARE_PROVIDER_SITE_OTHER): Payer: BC Managed Care – PPO | Admitting: Internal Medicine

## 2021-08-06 ENCOUNTER — Encounter: Payer: Self-pay | Admitting: Internal Medicine

## 2021-08-06 VITALS — BP 134/82 | HR 75 | Temp 97.8°F | Resp 16 | Ht 65.0 in | Wt 222.0 lb

## 2021-08-06 DIAGNOSIS — I10 Essential (primary) hypertension: Secondary | ICD-10-CM | POA: Diagnosis not present

## 2021-08-06 DIAGNOSIS — Z6836 Body mass index (BMI) 36.0-36.9, adult: Secondary | ICD-10-CM

## 2021-08-06 DIAGNOSIS — G478 Other sleep disorders: Secondary | ICD-10-CM | POA: Diagnosis not present

## 2021-08-06 DIAGNOSIS — E785 Hyperlipidemia, unspecified: Secondary | ICD-10-CM

## 2021-08-06 MED ORDER — SEMAGLUTIDE-WEIGHT MANAGEMENT 2.4 MG/0.75ML ~~LOC~~ SOAJ: 2.4000 mg | SUBCUTANEOUS | 0 refills | Status: DC

## 2021-08-06 MED ORDER — SEMAGLUTIDE-WEIGHT MANAGEMENT 0.25 MG/0.5ML ~~LOC~~ SOAJ: 0.2500 mg | SUBCUTANEOUS | 0 refills | Status: AC

## 2021-08-06 MED ORDER — ATORVASTATIN CALCIUM 10 MG PO TABS: 10.0000 mg | ORAL_TABLET | Freq: Every day | ORAL | 1 refills | Status: DC

## 2021-08-06 MED ORDER — SEMAGLUTIDE-WEIGHT MANAGEMENT 1 MG/0.5ML ~~LOC~~ SOAJ: 1.0000 mg | SUBCUTANEOUS | 0 refills | Status: AC

## 2021-08-06 MED ORDER — SEMAGLUTIDE-WEIGHT MANAGEMENT 1.7 MG/0.75ML ~~LOC~~ SOAJ: 1.7000 mg | SUBCUTANEOUS | 0 refills | Status: AC

## 2021-08-06 MED ORDER — SEMAGLUTIDE-WEIGHT MANAGEMENT 0.5 MG/0.5ML ~~LOC~~ SOAJ: 0.5000 mg | SUBCUTANEOUS | 0 refills | Status: AC

## 2021-08-06 NOTE — Patient Instructions (Signed)
Hypertension, Adult High blood pressure (hypertension) is when the force of blood pumping through the arteries is too strong. The arteries are the blood vessels that carry blood from the heart throughout the body. Hypertension forces the heart to work harder to pump blood and may cause arteries to become narrow or stiff. Untreated or uncontrolled hypertension can lead to a heart attack, heart failure, a stroke, kidney disease, and other problems. A blood pressure reading consists of a higher number over a lower number. Ideally, your blood pressure should be below 120/80. The first ("top") number is called the systolic pressure. It is a measure of the pressure in your arteries as your heart beats. The second ("bottom") number is called the diastolic pressure. It is a measure of the pressure in your arteries as the heart relaxes. What are the causes? The exact cause of this condition is not known. There are some conditions that result in high blood pressure. What increases the risk? Certain factors may make you more likely to develop high blood pressure. Some of these risk factors are under your control, including: Smoking. Not getting enough exercise or physical activity. Being overweight. Having too much fat, sugar, calories, or salt (sodium) in your diet. Drinking too much alcohol. Other risk factors include: Having a personal history of heart disease, diabetes, high cholesterol, or kidney disease. Stress. Having a family history of high blood pressure and high cholesterol. Having obstructive sleep apnea. Age. The risk increases with age. What are the signs or symptoms? High blood pressure may not cause symptoms. Very high blood pressure (hypertensive crisis) may cause: Headache. Fast or irregular heartbeats (palpitations). Shortness of breath. Nosebleed. Nausea and vomiting. Vision changes. Severe chest pain, dizziness, and seizures. How is this diagnosed? This condition is diagnosed by  measuring your blood pressure while you are seated, with your arm resting on a flat surface, your legs uncrossed, and your feet flat on the floor. The cuff of the blood pressure monitor will be placed directly against the skin of your upper arm at the level of your heart. Blood pressure should be measured at least twice using the same arm. Certain conditions can cause a difference in blood pressure between your right and left arms. If you have a high blood pressure reading during one visit or you have normal blood pressure with other risk factors, you may be asked to: Return on a different day to have your blood pressure checked again. Monitor your blood pressure at home for 1 week or longer. If you are diagnosed with hypertension, you may have other blood or imaging tests to help your health care provider understand your overall risk for other conditions. How is this treated? This condition is treated by making healthy lifestyle changes, such as eating healthy foods, exercising more, and reducing your alcohol intake. You may be referred for counseling on a healthy diet and physical activity. Your health care provider may prescribe medicine if lifestyle changes are not enough to get your blood pressure under control and if: Your systolic blood pressure is above 130. Your diastolic blood pressure is above 80. Your personal target blood pressure may vary depending on your medical conditions, your age, and other factors. Follow these instructions at home: Eating and drinking  Eat a diet that is high in fiber and potassium, and low in sodium, added sugar, and fat. An example of this eating plan is called the DASH diet. DASH stands for Dietary Approaches to Stop Hypertension. To eat this way: Eat   plenty of fresh fruits and vegetables. Try to fill one half of your plate at each meal with fruits and vegetables. Eat whole grains, such as whole-wheat pasta, brown rice, or whole-grain bread. Fill about one  fourth of your plate with whole grains. Eat or drink low-fat dairy products, such as skim milk or low-fat yogurt. Avoid fatty cuts of meat, processed or cured meats, and poultry with skin. Fill about one fourth of your plate with lean proteins, such as fish, chicken without skin, beans, eggs, or tofu. Avoid pre-made and processed foods. These tend to be higher in sodium, added sugar, and fat. Reduce your daily sodium intake. Many people with hypertension should eat less than 1,500 mg of sodium a day. Do not drink alcohol if: Your health care provider tells you not to drink. You are pregnant, may be pregnant, or are planning to become pregnant. If you drink alcohol: Limit how much you have to: 0-1 drink a day for women. 0-2 drinks a day for men. Know how much alcohol is in your drink. In the [place] one drink equals one 12 oz bottle of beer (355 mL), one 5 oz glass of wine (148 mL), or one 1 oz glass of hard liquor (44 mL). Lifestyle  Work with your health care provider to maintain a healthy body weight or to lose weight. Ask what an ideal weight is for you. Get at least 30 minutes of exercise that causes your heart to beat faster (aerobic exercise) most days of the week. Activities may include walking, swimming, or biking. Include exercise to strengthen your muscles (resistance exercise), such as Pilates or lifting weights, as part of your weekly exercise routine. Try to do these types of exercises for 30 minutes at least 3 days a week. Do not use any products that contain nicotine or tobacco. These products include cigarettes, chewing tobacco, and vaping devices, such as e-cigarettes. If you need help quitting, ask your health care provider. Monitor your blood pressure at home as told by your health care provider. Keep all follow-up visits. This is important. Medicines Take over-the-counter and prescription medicines only as told by your health care provider. Follow directions carefully. Blood  pressure medicines must be taken as prescribed. Do not skip doses of blood pressure medicine. Doing this puts you at risk for problems and can make the medicine less effective. Ask your health care provider about side effects or reactions to medicines that you should watch for. Contact a health care provider if you: Think you are having a reaction to a medicine you are taking. Have headaches that keep coming back (recurring). Feel dizzy. Have swelling in your ankles. Have trouble with your vision. Get help right away if you: Develop a severe headache or confusion. Have unusual weakness or numbness. Feel faint. Have severe pain in your chest or abdomen. Vomit repeatedly. Have trouble breathing. These symptoms may be an emergency. Get help right away. Call 911. Do not wait to see if the symptoms will go away. Do not drive yourself to the hospital. Summary Hypertension is when the force of blood pumping through your arteries is too strong. If this condition is not controlled, it may put you at risk for serious complications. Your personal target blood pressure may vary depending on your medical conditions, your age, and other factors. For most people, a normal blood pressure is less than 120/80. Hypertension is treated with lifestyle changes, medicines, or a combination of both. Lifestyle changes include losing weight, eating a healthy,   low-sodium diet, exercising more, and limiting alcohol. This information is not intended to replace advice given to you by your health care provider. Make sure you discuss any questions you have with your health care provider. Document Revised: 01/09/2021 Document Reviewed: 01/09/2021 Elsevier Patient Education  2023 [employer]  

## 2021-08-06 NOTE — Progress Notes (Signed)
Subjective:  Patient ID: [name] [name], female    DOB: 1961-02-05  Age: 61 y.o. MRN: [id number]  CC: Hypertension and Hyperlipidemia   HPI [name] [name] presents for f/up -  She has erythrocytosis and she has been told to undergo a sleep study.  She is not aware of snoring or sleep apnea but she sleeps alone.  She does complain of nonrestorative sleep, chronic fatigue, and musculoskeletal pain.  Outpatient Medications Prior to Visit  Medication Sig Dispense Refill   aspirin EC 81 MG tablet Take 81 mg by mouth daily. Swallow whole.     buPROPion (WELLBUTRIN XL) 150 MG 24 hr tablet TAKE 1 TABLET(150 MG) BY MOUTH DAILY 90 tablet 1   Carboxymethylcellulose Sodium (EYE DROPS OP) Place 1 drop into both eyes daily. replens     DULoxetine (CYMBALTA) 60 MG capsule Take 1 capsule (60 mg total) by mouth daily. 90 capsule 1   estradiol (ESTRACE) 1 MG tablet Take 1 mg by mouth daily.     indapamide (LOZOL) 1.25 MG tablet TAKE 1 TABLET BY MOUTH EVERY DAY 90 tablet 0   methocarbamol (ROBAXIN) 500 MG tablet Take 1 tablet (500 mg total) by mouth 2 (two) times daily. (Patient taking differently: Take 500 mg by mouth as needed.) 20 tablet 0   metoprolol tartrate (LOPRESSOR) 25 MG tablet TAKE 1 TABLET(25 MG) BY MOUTH TWICE DAILY 180 tablet 0   Naproxen Sodium (ALEVE PO) Take 2 tablets by mouth every 12 (twelve) hours.     omeprazole (PRILOSEC) 20 MG capsule Take 20 mg by mouth daily.     atorvastatin (LIPITOR) 10 MG tablet TAKE 1 TABLET DAILY 90 tablet 0   ondansetron (ZOFRAN-ODT) 4 MG disintegrating tablet Take 1 tablet (4 mg total) by mouth every 8 (eight) hours as needed for nausea or vomiting. 20 tablet 0   No facility-administered medications prior to visit.    ROS Review of Systems  Constitutional:  Positive for fatigue. Negative for appetite change, diaphoresis and unexpected weight change.  HENT: Negative.    Eyes:  Negative for visual disturbance.  Respiratory:  Negative for chest  tightness, shortness of breath and wheezing.   Cardiovascular:  Negative for chest pain, palpitations and leg swelling.  Gastrointestinal:  Negative for abdominal pain, diarrhea, nausea and vomiting.  Endocrine: Negative.   Genitourinary: Negative.  Negative for difficulty urinating.  Musculoskeletal:  Positive for arthralgias and back pain. Negative for myalgias.  Skin: Negative.   Neurological:  Negative for dizziness, weakness, light-headedness and headaches.  Hematological:  Negative for adenopathy. Does not bruise/bleed easily.  Psychiatric/Behavioral: Negative.     Objective:  BP 134/82 (BP Location: Left Arm, Patient Position: Sitting, Cuff Size: Large)   Pulse 75   Temp 97.8 F (36.6 C) (Oral)   Resp 16   Ht '5\' 5"'$  (1.651 m)   Wt 222 lb (100.7 kg)   LMP  (LMP Unknown)   SpO2 91%   BMI 36.94 kg/m   BP Readings from Last 3 Encounters:  08/06/21 134/82  07/30/21 129/78  07/23/21 110/67    Wt Readings from Last 3 Encounters:  08/06/21 222 lb (100.7 kg)  07/30/21 224 lb (101.6 kg)  07/23/21 225 lb 15.5 oz (102.5 kg)    Physical Exam Vitals reviewed.  Constitutional:      Appearance: She is obese. She is not ill-appearing.  HENT:     Nose: Nose normal.     Mouth/Throat:     Mouth: Mucous membranes are moist.  Eyes:     General: No scleral icterus.    Conjunctiva/sclera: Conjunctivae normal.  Cardiovascular:     Rate and Rhythm: Normal rate and regular rhythm.     Heart sounds: No murmur heard. Pulmonary:     Effort: Pulmonary effort is normal.     Breath sounds: No stridor. No wheezing, rhonchi or rales.  Abdominal:     General: Abdomen is flat.     Palpations: There is no mass.     Tenderness: There is no abdominal tenderness. There is no guarding.     Hernia: No hernia is present.  Musculoskeletal:        General: No swelling or deformity.     Cervical back: Neck supple.     Right lower leg: No edema.     Left lower leg: No edema.  Lymphadenopathy:      Cervical: No cervical adenopathy.  Skin:    General: Skin is warm and dry.  Neurological:     General: No focal deficit present.     Mental Status: She is alert.  Psychiatric:        Mood and Affect: Mood normal.        Behavior: Behavior normal.    Lab Results  Component Value Date   WBC 8.1 07/23/2021   HGB 17.6 (H) 07/23/2021   HCT 52.0 (H) 07/23/2021   PLT 309 07/23/2021   GLUCOSE 105 (H) 07/23/2021   CHOL 155 08/06/2021   TRIG 135.0 08/06/2021   HDL 41.10 08/06/2021   LDLCALC 87 08/06/2021   ALT 33 07/23/2021   AST 25 07/23/2021   NA 137 07/23/2021   K 3.8 07/23/2021   CL 104 07/23/2021   CREATININE 0.82 07/23/2021   BUN 12 07/23/2021   CO2 26 07/23/2021   TSH 2.44 08/06/2021   HGBA1C 5.8 05/08/2021   MICROALBUR <0.7 05/08/2021    US Abdomen Limited RUQ (LIVER/GB)  Result Date: 07/23/2021 CLINICAL DATA:  Right upper quadrant abdominal pain. EXAM: ULTRASOUND ABDOMEN LIMITED RIGHT UPPER QUADRANT COMPARISON:  None Available. FINDINGS: Gallbladder: No gallstones or wall thickening visualized. No sonographic Murphy sign noted by sonographer. Common bile duct: Diameter: 5 mm Liver: The liver demonstrates coarse echotexture and increased echogenicity, likely reflecting diffuse steatosis. No overt cirrhotic contour abnormalities or focal lesions are identified. There is no evidence of intrahepatic biliary ductal dilatation. Portal vein is patent on color Doppler imaging with normal direction of blood flow towards the liver. Other: None. IMPRESSION: Evidence of hepatic steatosis. Electronically Signed   By: Aletta Edouard M.D.   On: 07/23/2021 12:56    Assessment & Plan:   [name] [name] was seen today for hypertension and hyperlipidemia.  Diagnoses and all orders for this visit:  Essential hypertension- Her blood pressure is adequately well controlled. -     TSH; Future -     TSH  Hyperlipidemia with target LDL less than 130- LDL goal achieved. Doing well on the statin  -      Lipid panel; Future -     atorvastatin (LIPITOR) 10 MG tablet; Take 1 tablet (10 mg total) by mouth daily. -     TSH; Future -     TSH -     Lipid panel  Class 2 severe obesity due to excess calories with serious comorbidity and body mass index (BMI) of 36.0 to 36.9 in adult [facility]) -     Semaglutide-Weight Management 0.25 MG/0.5ML SOAJ; Inject 0.25 mg into the skin once a week for 28  days. -     Semaglutide-Weight Management 0.5 MG/0.5ML SOAJ; Inject 0.5 mg into the skin once a week for 28 days. -     Semaglutide-Weight Management 1 MG/0.5ML SOAJ; Inject 1 mg into the skin once a week for 28 days. -     Semaglutide-Weight Management 1.7 MG/0.75ML SOAJ; Inject 1.7 mg into the skin once a week for 28 days. -     Semaglutide-Weight Management 2.4 MG/0.75ML SOAJ; Inject 2.4 mg into the skin once a week for 28 days.  Non-restorative sleep -     Ambulatory referral to Sleep Studies   I have discontinued Railyn H. Berquist's ondansetron. I have also changed her atorvastatin. Additionally, I am having her start on Semaglutide-Weight Management, Semaglutide-Weight Management, Semaglutide-Weight Management, Semaglutide-Weight Management, and Semaglutide-Weight Management. Lastly, I am having her maintain her omeprazole, Carboxymethylcellulose Sodium (EYE DROPS OP), Naproxen Sodium (ALEVE PO), aspirin EC, DULoxetine, estradiol, buPROPion, methocarbamol, indapamide, and metoprolol tartrate.  Meds ordered this encounter  Medications   atorvastatin (LIPITOR) 10 MG tablet    Sig: Take 1 tablet (10 mg total) by mouth daily.    Dispense:  90 tablet    Refill:  1   Semaglutide-Weight Management 0.25 MG/0.5ML SOAJ    Sig: Inject 0.25 mg into the skin once a week for 28 days.    Dispense:  2 mL    Refill:  0   Semaglutide-Weight Management 0.5 MG/0.5ML SOAJ    Sig: Inject 0.5 mg into the skin once a week for 28 days.    Dispense:  2 mL    Refill:  0   Semaglutide-Weight Management 1 MG/0.5ML SOAJ    Sig:  Inject 1 mg into the skin once a week for 28 days.    Dispense:  2 mL    Refill:  0   Semaglutide-Weight Management 1.7 MG/0.75ML SOAJ    Sig: Inject 1.7 mg into the skin once a week for 28 days.    Dispense:  3 mL    Refill:  0   Semaglutide-Weight Management 2.4 MG/0.75ML SOAJ    Sig: Inject 2.4 mg into the skin once a week for 28 days.    Dispense:  3 mL    Refill:  0     Follow-up: Return in about 6 months (around 02/06/2022).  Scarlette Calico, MD

## 2021-08-07 ENCOUNTER — Ambulatory Visit: Payer: BC Managed Care – PPO | Admitting: Internal Medicine

## 2021-08-07 DIAGNOSIS — G478 Other sleep disorders: Secondary | ICD-10-CM | POA: Insufficient documentation

## 2021-08-07 LAB — LIPID PANEL
Cholesterol: 155 mg/dL (ref 0–200)
HDL: 41.1 mg/dL (ref >39.00)
LDL Cholesterol: 87 mg/dL (ref 0–99)
NonHDL: 114.27
Total CHOL/HDL Ratio: 4
Triglycerides: 135 mg/dL (ref 0.0–149.0)
VLDL: 27 mg/dL (ref 0.0–40.0)

## 2021-08-07 LAB — TSH: TSH: 2.44 u[IU]/mL (ref 0.35–5.50)

## 2021-08-08 ENCOUNTER — Telehealth: Payer: Self-pay

## 2021-08-08 NOTE — Telephone Encounter (Signed)
Key: [id number]

## 2021-08-09 ENCOUNTER — Encounter: Payer: Self-pay | Admitting: Physician Assistant

## 2021-08-09 NOTE — Telephone Encounter (Signed)
approved from 08/09/2021 to 03/11/2022

## 2021-08-21 ENCOUNTER — Telehealth: Payer: Self-pay | Admitting: Hematology and Oncology

## 2021-08-21 NOTE — Telephone Encounter (Signed)
Per 6/6 reschedule called and spoke to pt about reschedule.  Pt confirmed the new appointment

## 2021-09-04 ENCOUNTER — Ambulatory Visit: Payer: BC Managed Care – PPO | Admitting: Internal Medicine

## 2021-09-13 DIAGNOSIS — M65322 Trigger finger, left index finger: Secondary | ICD-10-CM | POA: Diagnosis not present

## 2021-09-27 ENCOUNTER — Institutional Professional Consult (permissible substitution): Payer: BC Managed Care – PPO | Admitting: Neurology

## 2021-10-01 DIAGNOSIS — B078 Other viral warts: Secondary | ICD-10-CM | POA: Diagnosis not present

## 2021-10-01 DIAGNOSIS — L821 Other seborrheic keratosis: Secondary | ICD-10-CM | POA: Diagnosis not present

## 2021-10-01 DIAGNOSIS — D1801 Hemangioma of skin and subcutaneous tissue: Secondary | ICD-10-CM | POA: Diagnosis not present

## 2021-10-01 DIAGNOSIS — L820 Inflamed seborrheic keratosis: Secondary | ICD-10-CM | POA: Diagnosis not present

## 2021-10-01 DIAGNOSIS — L812 Freckles: Secondary | ICD-10-CM | POA: Diagnosis not present

## 2021-10-01 DIAGNOSIS — L918 Other hypertrophic disorders of the skin: Secondary | ICD-10-CM | POA: Diagnosis not present

## 2021-10-02 ENCOUNTER — Institutional Professional Consult (permissible substitution): Payer: BC Managed Care – PPO | Admitting: Neurology

## 2021-10-24 DIAGNOSIS — M25552 Pain in left hip: Secondary | ICD-10-CM | POA: Diagnosis not present

## 2021-11-01 ENCOUNTER — Other Ambulatory Visit: Payer: Self-pay | Admitting: Internal Medicine

## 2021-11-01 DIAGNOSIS — F330 Major depressive disorder, recurrent, mild: Secondary | ICD-10-CM

## 2021-11-01 DIAGNOSIS — I10 Essential (primary) hypertension: Secondary | ICD-10-CM

## 2021-11-02 ENCOUNTER — Other Ambulatory Visit: Payer: Self-pay | Admitting: Internal Medicine

## 2021-11-02 DIAGNOSIS — I10 Essential (primary) hypertension: Secondary | ICD-10-CM

## 2021-12-03 ENCOUNTER — Other Ambulatory Visit: Payer: Self-pay

## 2021-12-03 ENCOUNTER — Inpatient Hospital Stay: Payer: BC Managed Care – PPO | Attending: Hematology and Oncology

## 2021-12-03 ENCOUNTER — Inpatient Hospital Stay (HOSPITAL_BASED_OUTPATIENT_CLINIC_OR_DEPARTMENT_OTHER): Payer: BC Managed Care – PPO | Admitting: Hematology and Oncology

## 2021-12-03 VITALS — BP 134/85 | HR 63 | Temp 97.8°F | Resp 14 | Wt 220.2 lb

## 2021-12-03 DIAGNOSIS — D508 Other iron deficiency anemias: Secondary | ICD-10-CM

## 2021-12-03 DIAGNOSIS — Z803 Family history of malignant neoplasm of breast: Secondary | ICD-10-CM | POA: Insufficient documentation

## 2021-12-03 DIAGNOSIS — E538 Deficiency of other specified B group vitamins: Secondary | ICD-10-CM | POA: Insufficient documentation

## 2021-12-03 DIAGNOSIS — D751 Secondary polycythemia: Secondary | ICD-10-CM

## 2021-12-03 DIAGNOSIS — D509 Iron deficiency anemia, unspecified: Secondary | ICD-10-CM | POA: Diagnosis not present

## 2021-12-03 LAB — CBC WITH DIFFERENTIAL (CANCER CENTER ONLY)
Abs Immature Granulocytes: 0.03 10*3/uL (ref 0.00–0.07)
Basophils Absolute: 0.1 10*3/uL (ref 0.0–0.1)
Basophils Relative: 1 %
Eosinophils Absolute: 0.2 10*3/uL (ref 0.0–0.5)
Eosinophils Relative: 3 %
HCT: 50.8 % — ABNORMAL HIGH (ref 36.0–46.0)
Hemoglobin: 16.9 g/dL — ABNORMAL HIGH (ref 12.0–15.0)
Immature Granulocytes: 0 %
Lymphocytes Relative: 25 %
Lymphs Abs: 2.3 10*3/uL (ref 0.7–4.0)
MCH: 29 pg (ref 26.0–34.0)
MCHC: 33.3 g/dL (ref 30.0–36.0)
MCV: 87.1 fL (ref 80.0–100.0)
Monocytes Absolute: 0.8 10*3/uL (ref 0.1–1.0)
Monocytes Relative: 9 %
Neutro Abs: 5.8 10*3/uL (ref 1.7–7.7)
Neutrophils Relative %: 62 %
Platelet Count: 328 10*3/uL (ref 150–400)
RBC: 5.83 MIL/uL — ABNORMAL HIGH (ref 3.87–5.11)
RDW: 13.9 % (ref 11.5–15.5)
WBC Count: 9.2 10*3/uL (ref 4.0–10.5)
nRBC: 0 % (ref 0.0–0.2)

## 2021-12-03 LAB — CMP (CANCER CENTER ONLY)
ALT: 29 U/L (ref 0–44)
AST: 22 U/L (ref 15–41)
Albumin: 4.4 g/dL (ref 3.5–5.0)
Alkaline Phosphatase: 64 U/L (ref 38–126)
Anion gap: 8 (ref 5–15)
BUN: 15 mg/dL (ref 6–20)
CO2: 31 mmol/L (ref 22–32)
Calcium: 10 mg/dL (ref 8.9–10.3)
Chloride: 100 mmol/L (ref 98–111)
Creatinine: 0.84 mg/dL (ref 0.44–1.00)
GFR, Estimated: >60 mL/min (ref >60)
Glucose, Bld: 120 mg/dL — ABNORMAL HIGH (ref 70–99)
Potassium: 3 mmol/L — ABNORMAL LOW (ref 3.5–5.1)
Sodium: 139 mmol/L (ref 135–145)
Total Bilirubin: 0.6 mg/dL (ref 0.3–1.2)
Total Protein: 7.3 g/dL (ref 6.5–8.1)

## 2021-12-03 LAB — FERRITIN: Ferritin: 84 ng/mL (ref 11–307)

## 2021-12-03 LAB — IRON AND IRON BINDING CAPACITY (CC-WL,HP ONLY)
Iron: 77 ug/dL (ref 28–170)
Saturation Ratios: 23 % (ref 10.4–31.8)
TIBC: 333 ug/dL (ref 250–450)
UIBC: 256 ug/dL (ref 148–442)

## 2021-12-03 NOTE — Progress Notes (Signed)
[facility] [phone]   [phone]  PROGRESS NOTE  Patient Care Team: [name] Lima, MD as PCP - General (Internal Medicine) [name] Sprang, MD (Cardiology) [name] May, MD (Psychiatry) [name] Fam, MD as Consulting Physician (Ophthalmology)  Hematological/Oncological History 1) Labs from PCP, Dr. Scarlette Calico -09/21/2020: WBC 7.8, Hgb 10.5 (L), MCV 64.5 (L), Plt 438 (H), Iron 19 (L), Ferritin 6 (L), Vitamin B12 186 (L), Folate 12.9.   2) 09/25/2020: Initiated weekly vitamin B12 injections with PCP.  3) 10/04/2020: Establish care with [name] Query PA-C  4) 10/27/2020-11/24/2020: Received IV venofer x 5 doses  CHIEF COMPLAINTS/PURPOSE OF CONSULTATION:  "Iron deficiency anemia "  HISTORY OF PRESENTING ILLNESS:  [name] [name] 61 y.o. [name] returns today for a follow up for history of iron deficiency anemia s/p IV iron infusion. The last visit was on 03/28/2021 without any changes to her health. She is unaccompanied for this visit.   At today's visit, [name] [name] reports that she has noticed her energy levels have declined since the last visit.  She continues to complete all her daily activities on her own.  Patient has a good appetite but is trying to eat healthier and by implementing a Mediterranean diet.  She denies nausea, vomiting or abdominal pain.  She reports chronic constipation with a bowel movement every 2 days.  She does take stool softeners and laxatives as needed with relief of symptoms.  Patient denies easy bruising or signs of active bleeding.  She reports persistent diffuse joint pain that is present on a daily basis.  She needs to take Aleve daily to help take the edge off.  She is scheduled for a rheumatology consultation next month.  Patient denies fevers, chills, night sweats, shortness of breath, chest pain or cough.  She has no other complaints.  Rest of the 10 point ROS is below.  MEDICAL HISTORY:  Past Medical History:   Diagnosis Date   Anemia    Anxiety    Bigeminy    Cognitive changes    Depression    Dysrhythmia    bigeminy   Elevated liver enzymes 2017   undiagnosed and levels recently were more normal per pt   Erythromelalgia ([place])    Hyperlipidemia    Hypertension    IBS (irritable bowel syndrome)    Migraine, unspecified, without mention of intractable migraine without mention of status migrainosus    PONV (postoperative nausea and vomiting)    Tachycardia, unspecified    on BBloc    SURGICAL HISTORY: Past Surgical History:  Procedure Laterality Date   Bilateral myofascial release for recurrent plantar faciitis     BLADDER SUSPENSION N/A 08/09/2015   Procedure: TRANSVAGINAL TAPE (TVT) PROCEDURE;  Surgeon: Bobbye Charleston, MD;  Location: [facility];  Service: Gynecology;  Laterality: N/A;   CARPAL TUNNEL RELEASE     Colonoscopy with polypectomy  03/18/2014   Repeat 2021   CYSTOCELE REPAIR  08/09/2015   Procedure: ANTERIOR REPAIR (CYSTOCELE);  Surgeon: Bobbye Charleston, MD;  Location: [facility];  Service: Gynecology;;   CYSTOSCOPY N/A 08/09/2015   Procedure: Consuela Mimes;  Surgeon: Bobbye Charleston, MD;  Location: [facility];  Service: Gynecology;  Laterality: N/A;   GIVENS CAPSULE STUDY N/A 10/24/2020   Procedure: GIVENS CAPSULE STUDY;  Surgeon: Juanita Craver, MD;  Location: [facility];  Service: Endoscopy;  Laterality: N/A;   ROBOTIC ASSISTED TOTAL HYSTERECTOMY WITH BILATERAL SALPINGO OOPHERECTOMY Bilateral 08/09/2015   Procedure: ROBOTIC ASSISTED TOTAL HYSTERECTOMY WITH BILATERAL SALPINGO OOPHORECTOMY;  Surgeon: Bobbye Charleston, MD;  Location: [facility];  Service: Gynecology;  Laterality: Bilateral;   TRIGGER FINGER RELEASE     TUBAL LIGATION      SOCIAL HISTORY: Social History   Socioeconomic History   Marital status: Married    Spouse name: Not on file   Number of children: 2   Years of education: 12   Highest education level: High school graduate  Occupational History   Occupation:  customer service/sales  Tobacco Use   Smoking status: Never   Smokeless tobacco: Never  Vaping Use   Vaping Use: Never used  Substance and Sexual Activity   Alcohol use: Yes    Comment: occasional wine   Drug use: No   Sexual activity: Yes    Partners: Male  Other Topics Concern   Not on file  Social History Narrative   HSG. 1 year [employer] [place]. Married '86-divorced after 50yr; remarried  '96. No children, 2 step children. Work-sales. After a period of being unemployed she started a [name] job October '12. Marriage is stable.      Lives at home with husband.   Right-handed.   One Diet Coke per day, 2 cups coffee per day.   Social Determinants of Health   Financial Resource Strain: Not on file  Food Insecurity: Not on file  Transportation Needs: Not on file  Physical Activity: Not on file  Stress: Not on file  Social Connections: Not on file  Intimate Partner Violence: Not on file    FAMILY HISTORY: Family History  Problem Relation Age of Onset   Cancer Mother        breast cancer-left mastectomy   Asthma Mother    Alzheimer's disease Mother    Transient ischemic attack Mother    Other Mother        ITP   Hypertension Father    Hyperlipidemia Father    Diabetes Father    Heart attack Father [id number]      CABG   Heart failure Father    Pancreatitis Father    Chronic fatigue Sister    Fibromyalgia Sister    Alzheimer's disease Maternal Grandmother    Breast cancer Maternal Grandmother     ALLERGIES:  is allergic to enalapril, codeine, and macrobid [nitrofurantoin].  MEDICATIONS:  Current Outpatient Medications  Medication Sig Dispense Refill   aspirin EC 81 MG tablet Take 81 mg by mouth daily. Swallow whole.     atorvastatin (LIPITOR) 10 MG tablet Take 1 tablet (10 mg total) by mouth daily. 90 tablet 1   buPROPion (WELLBUTRIN XL) 150 MG 24 hr tablet TAKE 1 TABLET(150 MG) BY MOUTH DAILY 90 tablet 1   Carboxymethylcellulose Sodium (EYE DROPS OP) Place 1 drop  into both eyes daily. replens     DULoxetine (CYMBALTA) 60 MG capsule TAKE 1 CAPSULE BY MOUTH EVERY DAY 90 capsule 1   estradiol (ESTRACE) 1 MG tablet Take 1 mg by mouth daily.     indapamide (LOZOL) 1.25 MG tablet TAKE 1 TABLET BY MOUTH EVERY DAY 90 tablet 0   methocarbamol (ROBAXIN) 500 MG tablet Take 1 tablet (500 mg total) by mouth 2 (two) times daily. (Patient taking differently: Take 500 mg by mouth as needed.) 20 tablet 0   metoprolol tartrate (LOPRESSOR) 25 MG tablet TAKE 1 TABLET(25 MG) BY MOUTH TWICE DAILY 180 tablet 0   Naproxen Sodium (ALEVE PO) Take 2 tablets by mouth every 12 (twelve) hours.     omeprazole (PRILOSEC) 20 MG  capsule Take 20 mg by mouth daily.     Semaglutide-Weight Management 2.4 MG/0.75ML SOAJ Inject 2.4 mg into the skin once a week for 28 days. 3 mL 0   No current facility-administered medications for this visit.    REVIEW OF SYSTEMS:   Constitutional: ( - ) fevers, ( - )  chills , ( - ) night sweats Eyes: ( - ) blurriness of vision, ( - ) double vision, ( - ) watery eyes Ears, nose, mouth, throat, and face: ( - ) mucositis, ( - ) sore throat Respiratory: ( - ) cough, ( -) dyspnea, ( - ) wheezes Cardiovascular: ( -) palpitation, ( - ) chest discomfort, ( - ) lower extremity swelling Gastrointestinal:  ( - ) nausea, ( - ) heartburn, ( - ) change in bowel habits Skin: ( - ) abnormal skin rashes Lymphatics: ( - ) [name] lymphadenopathy, ( - ) easy bruising Neurological: ( - ) numbness, ( - ) tingling, ( - ) [name] weaknesses Behavioral/Psych: ( - ) mood change, ( - ) [name] changes  All other systems were reviewed with the patient and are negative.  PHYSICAL EXAMINATION: ECOG PERFORMANCE STATUS: 1 - Symptomatic but completely ambulatory  Vitals:   12/03/21 1515  BP: 134/85  Pulse: 63  Resp: 14  Temp: 97.8 F (36.6 C)  SpO2: 97%   Filed Weights   12/03/21 1515  Weight: 220 lb 3.2 oz (99.9 kg)    GENERAL: well appearing [name] in NAD  SKIN: skin color,  texture, turgor are normal, no rashes or significant lesions EYES: conjunctiva are pink and non-injected, sclera clear OROPHARYNX: no exudate, no erythema; lips, buccal mucosa, and tongue normal  LUNGS: clear to auscultation and percussion with normal breathing effort HEART: regular rate & rhythm and no murmurs and no lower extremity edema Musculoskeletal: no cyanosis of digits and no clubbing  PSYCH: alert & oriented x 3, fluent speech NEURO: no focal motor/sensory deficits  LABORATORY DATA:  I have reviewed the data as listed    Latest Ref Rng & Units 12/03/2021    2:27 PM 07/23/2021   11:46 AM 06/12/2021    3:09 PM  CBC  WBC 4.0 - 10.5 K/uL 9.2  8.1  8.4   Hemoglobin 12.0 - 15.0 g/dL 16.9  17.6  17.0   Hematocrit 36.0 - 46.0 % 50.8  52.0  50.9   Platelets 150 - 400 K/uL 328  309  274        Latest Ref Rng & Units 12/03/2021    2:27 PM 07/23/2021   11:46 AM 05/08/2021   10:58 AM  CMP  Glucose 70 - 99 mg/dL 120  105  95   BUN 6 - 20 mg/dL '15  12  16   '$ Creatinine 0.44 - 1.00 mg/dL 0.84  0.82  0.85   Sodium 135 - 145 mmol/L 139  137  138   Potassium 3.5 - 5.1 mmol/L 3.0  3.8  3.6   Chloride 98 - 111 mmol/L 100  104  97   CO2 22 - 32 mmol/L 31  26  38   Calcium 8.9 - 10.3 mg/dL 10.0  9.7  10.3   Total Protein 6.5 - 8.1 g/dL 7.3  6.9    Total Bilirubin 0.3 - 1.2 mg/dL 0.6  0.6    Alkaline Phos 38 - 126 U/L 64  58    AST 15 - 41 U/L 22  25    ALT 0 - 44 U/L 29  [name] [name] is a [name] who returns for a follow up for iron deficiency anemia.   #Iron deficiency anemia, resolved: --Uncertain etiology as patient denies any signs of bleeding.  --Patient underwent hysterectomy in 2017.  --Patient underwent endoscopy and colonoscopy on 07/14/2020 with Dr. Juanita Craver. Colonoscopy revealed small sessile polyp in the mid-sigmoid colon. EGD was unremarkable. --Underwent capsular endoscopy on 10/24/2020 which showed normal appearing small bowel without any  source of bleeding.  --Received IV venofer x 5 doses from 10/27/2020-11/24/2020 --Labs today show no evidence of iron deficiency.  --No additional IV or oral iron needed at this time.   #Vitamin B12 deficiency, improved: --Most recent vitamin B12 level from 04/05/2021 was 702.  --Received vitamin B12 injections 1000 mcg x 4 doses. Last dose was on 11/10/2020. --Okay to hold addition B12 injections.   #Polycythemia, secondary: --Hgb was elevated since receiving IV iron back in August/September 2022. Patient is not taking oral iron at this time. She denies smoking, OSA or dehydration.  --Workup included erythropoietin levels, BCR able FISH, MPN panel.  All were unremarkable. --Labs today show hemoglobin level of 17.0, overall stable --Recommend to be evaluated for sleep apnea. --Continue to monitor   Follow up: -6 months with repeat labs.   No orders of the defined types were placed in this encounter.   All questions were answered. The patient knows to call the clinic with any problems, questions or concerns.  I have spent a total of 25 minutes minutes of face-to-face and non-face-to-face time, preparing to see the patient, performing a medically appropriate examination, counseling and educating the patient, ordering tests, documenting clinical information in the electronic health record, and care coordination.   Ledell Peoples, MD Department of Hematology/Oncology [place] at [facility] Phone: [phone] Pager: [phone] Email: [email or web address]

## 2021-12-04 ENCOUNTER — Encounter: Payer: Self-pay | Admitting: Physician Assistant

## 2021-12-08 ENCOUNTER — Other Ambulatory Visit: Payer: Self-pay | Admitting: Internal Medicine

## 2021-12-08 DIAGNOSIS — F330 Major depressive disorder, recurrent, mild: Secondary | ICD-10-CM

## 2021-12-12 ENCOUNTER — Ambulatory Visit: Payer: BC Managed Care – PPO | Admitting: Hematology and Oncology

## 2021-12-12 ENCOUNTER — Other Ambulatory Visit: Payer: BC Managed Care – PPO

## 2022-01-02 ENCOUNTER — Ambulatory Visit (INDEPENDENT_AMBULATORY_CARE_PROVIDER_SITE_OTHER): Payer: BC Managed Care – PPO

## 2022-01-02 ENCOUNTER — Ambulatory Visit (INDEPENDENT_AMBULATORY_CARE_PROVIDER_SITE_OTHER): Payer: BC Managed Care – PPO | Admitting: Internal Medicine

## 2022-01-02 ENCOUNTER — Encounter: Payer: Self-pay | Admitting: Internal Medicine

## 2022-01-02 VITALS — BP 142/80 | HR 61 | Temp 98.2°F | Ht 65.0 in | Wt 223.0 lb

## 2022-01-02 DIAGNOSIS — J069 Acute upper respiratory infection, unspecified: Secondary | ICD-10-CM

## 2022-01-02 DIAGNOSIS — R062 Wheezing: Secondary | ICD-10-CM | POA: Diagnosis not present

## 2022-01-02 DIAGNOSIS — R059 Cough, unspecified: Secondary | ICD-10-CM | POA: Diagnosis not present

## 2022-01-02 LAB — POC INFLUENZA A&B (BINAX/QUICKVUE)
Influenza A, POC: NEGATIVE
Influenza B, POC: NEGATIVE

## 2022-01-02 LAB — POC COVID19 BINAXNOW: SARS Coronavirus 2 Ag: NEGATIVE

## 2022-01-02 MED ORDER — BENZONATATE 200 MG PO CAPS: 200.0000 mg | ORAL_CAPSULE | Freq: Three times a day (TID) | ORAL | 0 refills | Status: DC | PRN

## 2022-01-02 NOTE — Progress Notes (Signed)
Subjective:    Patient ID: [name] [name], female    DOB: 08-May-1960, 61 y.o.   MRN: [id number]      HPI [name] [name] is here for  Chief Complaint  Patient presents with   Cough    Cough, chest pain/tightness; fatigue; had symptoms 2 weeks ago; cleared up an came back and she is worse now with the cough and congestion    She was sick about 4 weeks ago - lasted about 2 weeks and then went away.  Her symptoms restarted last week - mid week, so have been going on for about a week.  Her symptoms have gotten worse in the past 3 days and seem to be progressing.  She states decreased appetite, fatigue,, occasional postnasal drip, sore throat, cough, wheezing with coughing or forced expiration, Chest discomfort with coughing, breathing and now and some lightheadedness.  She has not had any shortness of breath, nasal congestion, sinus pain or headaches.   She has taken mucinex.    No obvious sick contacts.    Medications and allergies reviewed with patient and updated if appropriate.  Current Outpatient Medications on File Prior to Visit  Medication Sig Dispense Refill   aspirin EC 81 MG tablet Take 81 mg by mouth daily. Swallow whole.     atorvastatin (LIPITOR) 10 MG tablet Take 1 tablet (10 mg total) by mouth daily. 90 tablet 1   buPROPion (WELLBUTRIN XL) 150 MG 24 hr tablet TAKE 1 TABLET (150 MG) BY MOUTH DAILY 90 tablet 1   Carboxymethylcellulose Sodium (EYE DROPS OP) Place 1 drop into both eyes daily. replens     DULoxetine (CYMBALTA) 60 MG capsule TAKE 1 CAPSULE BY MOUTH EVERY DAY 90 capsule 1   estradiol (ESTRACE) 1 MG tablet Take 1 mg by mouth daily.     indapamide (LOZOL) 1.25 MG tablet TAKE 1 TABLET BY MOUTH EVERY DAY 90 tablet 0   metoprolol tartrate (LOPRESSOR) 25 MG tablet TAKE 1 TABLET(25 MG) BY MOUTH TWICE DAILY 180 tablet 0   Naproxen Sodium (ALEVE PO) Take 2 tablets by mouth every 12 (twelve) hours.     omeprazole (PRILOSEC) 20 MG capsule Take 20 mg by mouth daily.      triamcinolone acetonide (KENALOG-40) 40 MG/ML injection Inject into the articular space.     No current facility-administered medications on file prior to visit.    Review of Systems  Constitutional:  Positive for appetite change (mild decrease) and fatigue. Negative for chills and fever.  HENT:  Positive for postnasal drip (occ) and sore throat. Negative for congestion, ear pain, sinus pressure and sinus pain.        No change in taste or smell  Respiratory:  Positive for cough and wheezing (occ with cough or forced expiration). Negative for shortness of breath.   Cardiovascular:  Positive for chest pain (soreness with cough, breathing in and out).  Gastrointestinal:  Negative for diarrhea and nausea.  Neurological:  Positive for light-headedness. Negative for dizziness and headaches.       Objective:   Vitals:   01/02/22 0856  BP: (!) 142/80  Pulse: 61  Temp: 98.2 F (36.8 C)  SpO2: 99%   BP Readings from Last 3 Encounters:  01/02/22 (!) 142/80  12/03/21 134/85  08/06/21 134/82   Wt Readings from Last 3 Encounters:  01/02/22 223 lb (101.2 kg)  12/03/21 220 lb 3.2 oz (99.9 kg)  08/06/21 222 lb (100.7 kg)   Body mass index is 37.11 kg/m.  Physical Exam Constitutional:      General: She is not in acute distress.    Appearance: Normal appearance. She is not ill-appearing.  HENT:     Head: Normocephalic and atraumatic.     Right Ear: Tympanic membrane, ear canal and external ear normal.     Left Ear: Tympanic membrane, ear canal and external ear normal.     Mouth/Throat:     Mouth: Mucous membranes are moist.     Pharynx: No oropharyngeal exudate or posterior oropharyngeal erythema.  Eyes:     Conjunctiva/sclera: Conjunctivae normal.  Cardiovascular:     Rate and Rhythm: Normal rate and regular rhythm.  Pulmonary:     Effort: Pulmonary effort is normal. No respiratory distress.     Breath sounds: Normal breath sounds. No wheezing or rales.  Musculoskeletal:      Cervical back: Neck supple. No tenderness.  Lymphadenopathy:     Cervical: No cervical adenopathy.  Skin:    General: Skin is warm and dry.  Neurological:     Mental Status: She is alert.            Assessment & Plan:    URI, viral: Acute Symptoms likely viral in nature COVID and flu tests are negative Continue symptomatic treatment with over-the-counter cold medications, Tylenol/ibuprofen Prescription for Tessalon Perles to use 3 times daily as needed for cough Increase rest and fluids Call if symptoms worsen or do not improve

## 2022-01-02 NOTE — Patient Instructions (Addendum)
Your covid and flu test negative.     Have a chest xray downstairs.      Medications changes include :  tessalon perles for your cough     Return if symptoms worsen or fail to improve.   Upper Respiratory Infection, Adult An upper respiratory infection (URI) is a common viral infection of the nose, throat, and upper air passages that lead to the lungs. The most common type of URI is the common cold. URIs usually get better on their own, without medical treatment. What are the causes? A URI is caused by a virus. You may catch a virus by: Breathing in droplets from an infected person's cough or sneeze. Touching something that has been exposed to the virus (is contaminated) and then touching your mouth, nose, or eyes. What increases the risk? You are more likely to get a URI if: You are very young or very old. You have close contact with others, such as at work, school, or a health care facility. You smoke. You have long-term (chronic) heart or lung disease. You have a weakened disease-fighting system (immune system). You have nasal allergies or asthma. You are experiencing a lot of stress. You have poor nutrition. What are the signs or symptoms? A URI usually involves some of the following symptoms: Runny or stuffy (congested) nose. Cough. Sneezing. Sore throat. Headache. Fatigue. Fever. Loss of appetite. Pain in your forehead, behind your eyes, and over your cheekbones (sinus pain). Muscle aches. Redness or irritation of the eyes. Pressure in the ears or face. How is this diagnosed? This condition may be diagnosed based on your medical history and symptoms, and a physical exam. Your health care provider may use a swab to take a mucus sample from your nose (nasal swab). This sample can be tested to determine what virus is causing the illness. How is this treated? URIs usually get better on their own within 7-10 days. Medicines cannot cure URIs, but your health  care provider may recommend certain medicines to help relieve symptoms, such as: Over-the-counter cold medicines. Cough suppressants. Coughing is a type of defense against infection that helps to clear the respiratory system, so take these medicines only as recommended by your health care provider. Fever-reducing medicines. Follow these instructions at home: Activity Rest as needed. If you have a fever, stay home from work or school until your fever is gone or until your health care provider says your URI cannot spread to other people (is no longer contagious). Your health care provider may have you wear a face mask to prevent your infection from spreading. Relieving symptoms Gargle with a mixture of salt and water 3-4 times a day or as needed. To make salt water, completely dissolve -1 tsp (3-6 g) of salt in 1 cup (237 mL) of warm water. Use a cool-mist humidifier to add moisture to the air. This can help you breathe more easily. Eating and drinking  Drink enough fluid to keep your urine pale yellow. Eat soups and other clear broths. General instructions  Take over-the-counter and prescription medicines only as told by your health care provider. These include cold medicines, fever reducers, and cough suppressants. Do not use any products that contain nicotine or tobacco. These products include cigarettes, chewing tobacco, and vaping devices, such as e-cigarettes. If you need help quitting, ask your health care provider. Stay away from secondhand smoke. Stay up to date on all immunizations, including the yearly (annual) flu vaccine. Keep all follow-up visits. This  is important. How to prevent the spread of infection to others URIs can be contagious. To prevent the infection from spreading: Wash your hands with soap and water for at least 20 seconds. If soap and water are not available, use hand sanitizer. Avoid touching your mouth, face, eyes, or nose. Cough or sneeze into a tissue or your  sleeve or elbow instead of into your hand or into the air.  Contact a health care provider if: You are getting worse instead of better. You have a fever or chills. Your mucus is brown or red. You have yellow or brown discharge coming from your nose. You have pain in your face, especially when you bend forward. You have swollen neck glands. You have pain while swallowing. You have white areas in the back of your throat. Get help right away if: You have shortness of breath that gets worse. You have severe or persistent: Headache. Ear pain. Sinus pain. Chest pain. You have chronic lung disease along with any of the following: Making high-pitched whistling sounds when you breathe, most often when you breathe out (wheezing). Prolonged cough (more than 14 days). Coughing up blood. A change in your usual mucus. You have a stiff neck. You have changes in your: Vision. Hearing. Thinking. Mood. These symptoms may be an emergency. Get help right away. Call 911. Do not wait to see if the symptoms will go away. Do not drive yourself to the hospital. Summary An upper respiratory infection (URI) is a common infection of the nose, throat, and upper air passages that lead to the lungs. A URI is caused by a virus. URIs usually get better on their own within 7-10 days. Medicines cannot cure URIs, but your health care provider may recommend certain medicines to help relieve symptoms. This information is not intended to replace advice given to you by your health care provider. Make sure you discuss any questions you have with your health care provider. Document Revised: 10/04/2020 Document Reviewed: 10/04/2020 [employer] Patient Education  [place].

## 2022-01-03 ENCOUNTER — Ambulatory Visit: Payer: BC Managed Care – PPO | Admitting: Family Medicine

## 2022-01-15 DIAGNOSIS — E119 Type 2 diabetes mellitus without complications: Secondary | ICD-10-CM | POA: Diagnosis not present

## 2022-01-15 DIAGNOSIS — H11153 Pinguecula, bilateral: Secondary | ICD-10-CM | POA: Diagnosis not present

## 2022-01-15 DIAGNOSIS — H04123 Dry eye syndrome of bilateral lacrimal glands: Secondary | ICD-10-CM | POA: Diagnosis not present

## 2022-01-15 DIAGNOSIS — H2513 Age-related nuclear cataract, bilateral: Secondary | ICD-10-CM | POA: Diagnosis not present

## 2022-01-15 DIAGNOSIS — H524 Presbyopia: Secondary | ICD-10-CM | POA: Diagnosis not present

## 2022-01-15 LAB — HM DIABETES EYE EXAM

## 2022-01-17 ENCOUNTER — Ambulatory Visit (INDEPENDENT_AMBULATORY_CARE_PROVIDER_SITE_OTHER): Payer: BC Managed Care – PPO | Admitting: Neurology

## 2022-01-17 ENCOUNTER — Encounter: Payer: Self-pay | Admitting: Neurology

## 2022-01-17 VITALS — BP 142/82 | HR 72 | Ht 66.0 in | Wt 222.0 lb

## 2022-01-17 DIAGNOSIS — G478 Other sleep disorders: Secondary | ICD-10-CM

## 2022-01-17 DIAGNOSIS — R5382 Chronic fatigue, unspecified: Secondary | ICD-10-CM | POA: Diagnosis not present

## 2022-01-17 DIAGNOSIS — R0683 Snoring: Secondary | ICD-10-CM | POA: Diagnosis not present

## 2022-01-17 DIAGNOSIS — D751 Secondary polycythemia: Secondary | ICD-10-CM

## 2022-01-17 DIAGNOSIS — R768 Other specified abnormal immunological findings in serum: Secondary | ICD-10-CM | POA: Diagnosis not present

## 2022-01-17 NOTE — Patient Instructions (Signed)
Polycythemia Vera  Polycythemia vera (PV) is a form of blood cancer called a myeloproliferative neoplasm (MPN) in which the bone marrow makes too many red blood cells. The bone marrow may also make too many clotting cells (platelets) and white blood cells. Bone marrow is the spongy center of bones where blood cells are produced. Sometimes, this causes an overproduction of blood cells in the liver and spleen, causing those organs to become enlarged. Additionally, people who have PV are at a higher risk for stroke or heart attack because their blood may clot more easily. PV is a long-term, or chronic, disease. What are the causes? Almost all people who have PV have an abnormal gene (genetic mutation) that causes changes in the way that the bone marrow makes blood cells. This gene, which is called JAK2, is not hereditary. This means that it is not passed along from parent to child. It is not known what triggers the genetic mutation that causes the body to produce too many red blood cells. What increases the risk? You are more likely to develop this condition if: You are female. You are 61 years of age or older. What are the signs or symptoms? You may not have any symptoms in the early stage of PV. When symptoms develop, they may include: Itchy skin, especially after bathing. Burning sensation in the hands or feet. Abdominal fullness or feeling full soon after eating. Ulcers on fingers or toes. Shortness of breath. Bone or joint pain. Dizziness. Headache. Tiredness. Ringing in the ears. Weight loss. Unusual bleeding, including nosebleeds or bleeding from gums. How is this diagnosed? This condition may be diagnosed during a routine physical exam and a blood test called a complete blood count (CBC). Your health care provider may also suspect PV if you have symptoms. During the physical exam, your health care provider may find that you have an enlarged liver or spleen. You may also have tests to  confirm the diagnosis. These may include: A procedure to remove a sample of bone marrow for testing (bone marrow biopsy). Blood tests to check for: The JAK2 gene. Low levels of a hormone that helps to regulate red blood cell production (erythropoietin). How is this treated? There is no cure for PV, but treatment can help to control the disease. There are several types of treatment. No single treatment works for everyone. You will need to work with a blood cancer specialist (hematologist) to find the treatment that is best for you. This condition may be treated by: Periodically having some blood removed with a needle (phlebotomy) to lower the number of red blood cells. Taking medicine. Your health care provider may recommend: Low-dose aspirin to lower your risk for blood clots. A medicine to control blood counts. A medicine that slows down the effects of JAK2 gene. Other medicines to treat symptoms such as itching. Follow these instructions at home:  Take over-the-counter and prescription medicines only as told by your health care provider. Do regular exercise as told by your health care provider. Check your hands and feet regularly for any sores that do not heal. Do not use any products that contain nicotine or tobacco. These products include cigarettes, chewing tobacco, and vaping devices, such as e-cigarettes. If you need help quitting, ask your health care provider. Return to your normal activities as told by your health care provider. Ask your health care provider what activities are safe for you. Keep all follow-up visits. This is important. Contact a health care provider if: You have  side effects from your medicines. Your symptoms change or get worse at home. You have blood in your stool or you vomit blood. Get help right away if: You have sudden and severe pain in your abdomen. You have chest pain or difficulty breathing. You have any symptoms of a stroke. "BE FAST" is an easy way  to remember the main warning signs of a stroke: B - Balance. Signs are dizziness, sudden trouble walking, or loss of balance. E - Eyes. Signs are trouble seeing or a sudden change in vision. F - Face. Signs are sudden weakness or numbness of the face, or the face or eyelid drooping on one side. A - Arms. Signs are weakness or numbness in an arm. This happens suddenly and usually on one side of the body. S - Speech. Signs are sudden trouble speaking, slurred speech, or trouble understanding what people say. T - Time. Time to call emergency services. Write down what time symptoms started. You have other signs of a stroke, such as: A sudden, severe headache with no known cause. Nausea or vomiting. Seizure. These symptoms may be an emergency. Get help right away. Call 911. Do not wait to see if the symptoms will go away. Do not drive yourself to the hospital. Summary Polycythemia vera is a form of blood cancer in which the bone marrow makes too many red blood cells. People who have polycythemia vera are at a higher risk for stroke or heart attack because their blood may clot more easily. The disease is most often associated with an abnormal gene (genetic mutation) that causes changes in the way that the bone marrow makes blood cells. There is no cure for PV, but treatment can help to control the disease. It is treated by periodically having some blood removed and by taking medicines. This information is not intended to replace advice given to you by your health care provider. Make sure you discuss any questions you have with your health care provider. Document Revised: 02/27/2021 Document Reviewed: 02/27/2021 [employer] Patient Education  2023 [employer]  

## 2022-01-17 NOTE — Progress Notes (Signed)
SLEEP MEDICINE CLINIC    Provider:  Larey Seat, MD  Primary Care Physician:  Janith Lima, MD [place] [address]     Referring Provider: Janith Lima, [place],  [place] [address]          Chief Complaint according to patient   Patient presents with:     New Patient (Initial Visit)     Patient was last seen by Dr Krista Blue- in 2020, for memory troubles-  but in the interval developed anemia , got treated for it, now presenting with polycythemia . Questions about possible sleep apnea, hypoxia.       HISTORY OF PRESENT ILLNESS:  [name] [name] is a 62 - year- old caucasian female patient , who is here seen for a new problem upon referral by PCP on 01/17/2022 .  Chief concern according to patient :  " my doctors want me to get checked for sleep apnea"    I have the pleasure of seeing [name] [name] today, a right-handed White or Caucasian female with a possible sleep disorder.  She   has a past medical history of Anemia, Anxiety, Bigeminy, Cognitive changes, Depression, Dysrhythmia, Elevated liver enzymes (2017), Erythromelalgia ([place]), Hyperlipidemia, Hypertension, IBS (irritable bowel syndrome), Migraine, unspecified, without mention of intractable migraine without mention of status migrainosus, PONV (postoperative nausea and vomiting), and Tachycardia, unspecified.   Sleep relevant medical history: Nocturia 2-3 times at night/ , DDD cervical spine, disc replacement.    Family medical /sleep history: father had cardiac disease, CAD- mother had CVAs.    Social history:  Patient is working as Theme park manager for an [employer] and lives in a household with spouse, grown children  live on there own- no pets.   Tobacco use; never. ETOH use : 3-4 drinks a month,  Caffeine intake in form of Coffee( 1 mug in AM ) Soda(  12 ounces a day) Tea ( /) or energy drinks. Regular exercise: none , she has low exercise tolerance and naps after  gardening, .        Sleep habits are as follows: The patient's dinner time is between 5.30-6.30PM. The patient goes to bed at 9 PM and continues to sleep for 8 hours, wakes for 2 bathroom breaks, the first time at 12-1  AM.   The preferred sleep position is laterally, with the support of 2 pillows.  Dreams are reportedly frequent/vivid- often nightmares .  5.15  AM is the usual rise time.  The patient wakes up spontaneously before the alarm.   She reports not feeling refreshed or restored in AM, with symptoms such as dry mouth, morning headaches, and residual fatigue.  Naps are taken frequently, lasting from 1 to 2 hours  and are not more refreshing than nocturnal sleep.    Review of Systems: Out of a complete 14 system review, the patient complains of only the following symptoms, and all other reviewed systems are negative.:  Mostly Fatigue, sleepiness , snoring, dry mouth, low exercise tolerance, weight gain, anemia.    How likely are you to doze in the following situations: 0 = not likely, 1 = slight chance, 2 = moderate chance, 3 = high chance   Sitting and Reading? Watching Television? Sitting inactive in a public place (theater or meeting)? As a passenger in a car for an hour without a break? Lying down in the afternoon when circumstances permit? Sitting and talking to someone?  Sitting quietly after lunch without alcohol? In a car, while stopped for a few minutes in traffic?   Total = 5/ 24 points   FSS endorsed at 56/ 63 points.   Geriatric Depression score ( GDS) - 7/ 15 - anhedonia,   Social History   Socioeconomic History   Marital status: Married    Spouse name: Not on file   Number of children: 2   Years of education: 12   Highest education level: High school graduate  Occupational History   Occupation: customer service/sales  Tobacco Use   Smoking status: Never   Smokeless tobacco: Never  Vaping Use   Vaping Use: Never used  Substance and Sexual Activity    Alcohol use: Yes    Comment: occasional wine   Drug use: No   Sexual activity: Yes    Partners: Male  Other Topics Concern   Not on file  Social History Narrative   HSG. 1 year [employer]. Married '86-divorced after 3yr; remarried  '96. No children, 2 step children. 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 Marriage is stable.      Lives at home with husband.   Right-handed.   One Diet Coke per day, 2 cups coffee per day.   Social Determinants of HRadio broadcast assistantStrain: Not on file  Food Insecurity: Not on file  Transportation Needs: Not on file  Physical Activity: Not on file  Stress: Not on file  Social Connections: Not on file    Family History  Problem Relation Age of Onset   Cancer Mother        breast cancer-left mastectomy   Asthma Mother    Alzheimer's disease Mother    Transient ischemic attack Mother    Other Mother        ITP   Hypertension Father    Hyperlipidemia Father    Diabetes Father    Heart attack Father 680      CABG   Heart failure Father    Pancreatitis Father    Chronic fatigue Sister    Fibromyalgia Sister    Alzheimer's disease Maternal Grandmother    Breast cancer Maternal Grandmother     Past Medical History:  Diagnosis Date   Anemia    Anxiety    Bigeminy    Cognitive changes    Depression    Dysrhythmia    bigeminy   Elevated liver enzymes 2017   undiagnosed and levels recently were more normal per pt   Erythromelalgia ([place]    Hyperlipidemia    Hypertension    IBS (irritable bowel syndrome)    Migraine, unspecified, without mention of intractable migraine without mention of status migrainosus    PONV (postoperative nausea and vomiting)    Tachycardia, unspecified    on BBloc    Past Surgical History:  Procedure Laterality Date   Bilateral myofascial release for recurrent plantar faciitis     BLADDER SUSPENSION N/A 08/09/2015   Procedure: TRANSVAGINAL  TAPE (TVT) PROCEDURE;  Surgeon: MBobbye Charleston MD;  [facility];  Service: Gynecology;  Laterality: N/A;   CARPAL TUNNEL RELEASE     Colonoscopy with polypectomy  03/18/2014   Repeat 2021   CYSTOCELE REPAIR  08/09/2015   Procedure: ANTERIOR REPAIR (CYSTOCELE);  Surgeon: MBobbye Charleston MD;  [facility];  Service: Gynecology;;   CYSTOSCOPY N/A 08/09/2015   Procedure: CConsuela Mimes  Surgeon: MBobbye Charleston MD;  [facility];  Service: Gynecology;  Laterality: N/A;   GIVENS CAPSULE STUDY N/A 10/24/2020   Procedure: GIVENS CAPSULE STUDY;  Surgeon: Juanita Craver, MD;  Location[facility];  Service: Endoscopy;  Laterality: N/A;   ROBOTIC ASSISTED TOTAL HYSTERECTOMY WITH BILATERAL SALPINGO OOPHERECTOMY Bilateral 08/09/2015   Procedure: ROBOTIC ASSISTED TOTAL HYSTERECTOMY WITH BILATERAL SALPINGO OOPHORECTOMY;  Surgeon: Bobbye Charleston, MD;  Location: [facility];  Service: Gynecology;  Laterality: Bilateral;   TRIGGER FINGER RELEASE     TUBAL LIGATION       Current Outpatient Medications on File Prior to Visit  Medication Sig Dispense Refill   aspirin EC 81 MG tablet Take 81 mg by mouth daily. Swallow whole.     atorvastatin (LIPITOR) 10 MG tablet Take 1 tablet (10 mg total) by mouth daily. 90 tablet 1   buPROPion (WELLBUTRIN XL) 150 MG 24 hr tablet TAKE 1 TABLET (150 MG) BY MOUTH DAILY 90 tablet 1   Carboxymethylcellulose Sodium (EYE DROPS OP) Place 1 drop into both eyes daily. replens     DULoxetine (CYMBALTA) 60 MG capsule TAKE 1 CAPSULE BY MOUTH EVERY DAY 90 capsule 1   estradiol (ESTRACE) 1 MG tablet Take 1 mg by mouth daily.     indapamide (LOZOL) 1.25 MG tablet TAKE 1 TABLET BY MOUTH EVERY DAY 90 tablet 0   metoprolol tartrate (LOPRESSOR) 25 MG tablet TAKE 1 TABLET(25 MG) BY MOUTH TWICE DAILY 180 tablet 0   Naproxen Sodium (ALEVE PO) Take 2 tablets by mouth every 12 (twelve) hours.     omeprazole (PRILOSEC) 20 MG capsule Take 20 mg by mouth daily.     No current  facility-administered medications on file prior to visit.    Allergies  Allergen Reactions   Enalapril Cough   Codeine Nausea And Vomiting    REACTION: causes nausea and vomiting   Macrobid [Nitrofurantoin] Other (See Comments)    Gets elevated liver enzymes, low potassium, headache, nausea and generalized body aches shortly after taking meds.  This is the second time this had occurred.    I reviewed the patient's laboratory results from May of this year her white blood cell count was in normal range her hemoglobin was elevated at 17.6, her hematocrit was elevated at 52, and there was also an elevated glucose level however I am not sure if this was a fasting glucose.  The electrolytes HbA1c was within normal range 5.8 BUN was 12 creatinine 0.8.  AST and ALT were in normal range indicating normal liver function.  From May there is also a upper quadrant abdomen ultrasound noted which shows some evidence of hepatic steatosis this would be called nonalcoholic steatosis of the liver or Nash syndrome.   The patient was prescribed but is not  taking semaglutide-Ozempic for weight management- pre diabetic range hbA1c , she couldn't afford the costs.   Physical exam:  Today's Vitals   01/17/22 1524  BP: (!) 142/82  Pulse: 72  Weight: 222 lb (100.7 kg)  Height: '5\' 6"'$  (1.676 m)   Body mass index is 35.83 kg/m.   Wt Readings from Last 3 Encounters:  01/17/22 222 lb (100.7 kg)  01/02/22 223 lb (101.2 kg)  12/03/21 220 lb 3.2 oz (99.9 kg)     Ht Readings from Last 3 Encounters:  01/17/22 '5\' 6"'$  (1.676 m)  01/02/22 '5\' 5"'$  (1.651 m)  08/06/21 '5\' 5"'$  (1.651 m)      General: The patient is awake, alert and appears not in acute distress. The patient is well groomed. Head:  Normocephalic, atraumatic. Neck is supple. Mallampati 2-3,  neck circumference:15 inches . Nasal airflow  patent.  Retrognathia is not seen.  Dental status: crowded lower jaw Cardiovascular:  Regular rate and cardiac rhythm by  pulse,  without distended neck veins. Respiratory: Lungs are clear to auscultation.  Skin:  Without evidence of ankle edema, or rash. Trunk: The patient's posture is erect.   Neurologic exam : The patient is awake and alert, oriented to place and time.   Memory subjective described as intact.  Attention span & concentration ability appears normal.  Speech is fluent,  without dysarthria or aphasia.  Mood and affect are slightly defeated, depressed.    Cranial nerves: no loss of smell or taste reported  Pupils are equal and briskly reactive to light. Funduscopic exam deferred. .  Extraocular movements in vertical and horizontal planes were intact and without nystagmus.  No Diplopia but blurring . Visual fields by finger perimetry are intact. Hearing was intact to soft voice and finger rubbing.    Facial sensation intact to fine touch.  Facial motor strength is symmetric and tongue and uvula move midline.  Neck ROM : rotation, tilt and flexion extension were normal for age and shoulder shrug was symmetrical.    Motor exam:  Symmetric bulk, tone and ROM.   Normal tone without cog- wheeling, symmetric grip strength .   Sensory:  Fine touch, pinprick and vibration were normal.  Let arm tingling - at night.  Proprioception tested in the upper extremities was normal.   Coordination: Rapid alternating movements in the fingers/hands were of normal speed.  The Finger-to-nose maneuver was intact without evidence of ataxia, dysmetria or tremor.   Gait and station: Patient could rise unassisted from a seated position, walked without assistive device.  Stance is of normal width/ base and the patient turned with 3 steps.  Toe and heel walk were deferred.  Deep tendon reflexes: in the  upper and lower extremities are symmetric and intact.  Babinski response was deferred.       After spending a total time of  4  minutes face to face and additional time for physical and neurologic examination,  review of laboratory studies,  personal review of imaging studies, reports and results of other testing and review of referral information / records as far as provided in visit, I have established the following assessments:  1) nonrestorative sleep, sometimes waking up catching her breath gasping.  There is no restriction in sleep time.  High degree of fatigue and moderate sleepiness, but can be associated with OSA or depression.   2) risk factors for OSA - BMI(!) and airway anatomy. 35. 8 , Mallampati 2-3 , small crowded lower jaw.   3) high hemoglobin, hematocrit. Rule out nocturnal hypoxia. ANA positive . One year ago her ferritin was 7 (!) now its at 84 ng/mL , RBC is 5.83. hct 50.8.   4) left arm dyseasthesias - can be related to a known history cervical spine DDD.    My Plan is to proceed with:  1) PSG/ SPLIT at AHI 10/h . I will order a sleep study for this patient to be sure that we can rule out or rule in and confirm hypoxemia at night sleep apnea with intermittent brief oxygen desaturations as well acidosis with prolonged hypoxia can be an explanation for the development of erythrocytosis.  So the goal is here to see if her sleep is in someway responsible for an elevated hemo- globin and hematocrit and  red blood cell count.  2) in the year 2004 this patient received a artificial cervical disc, she now presents with some left-sided tingling and numbness sometimes painful and sometimes waking her up at night.  I think it would be worthwhile following up with a surgeon in her established group and see if there is a new impingement such as by bone spur etc.  3) fatigue and anhedonia as well as a prolonged sleep time the feeling of nonrestorative sleep even if numerically 8 hours of sleep can be reached is possibly related to a depression.  The patient is on medication for depression she takes Cymbalta as well as Wellbutrin.  I would like to thank Janith Lima, MD and Janith Lima,  [place],  [place] [address] for allowing me to meet with and to take care of this pleasant patient.   In short, [name] [name] is presenting with non -refreshing sleep, long sleep time and fatigue  I plan to follow up either personally or through our NP within 2-4 months.   CC: I will share my notes with PCP.  Electronically signed by: Larey Seat, MD 01/17/2022 3:35 PM  [facility] Board certified by The [employer] of Sleep Medicine and Diplomate of the [employer] of Sleep Medicine. Board certified In Neurology through the [place], Fellow of the [employer] of Neurology. Medical Director of [facility].

## 2022-02-05 ENCOUNTER — Other Ambulatory Visit: Payer: Self-pay | Admitting: Internal Medicine

## 2022-02-05 DIAGNOSIS — I10 Essential (primary) hypertension: Secondary | ICD-10-CM

## 2022-02-20 ENCOUNTER — Telehealth: Payer: Self-pay | Admitting: Internal Medicine

## 2022-02-20 NOTE — Telephone Encounter (Signed)
Patient would like to be prescribed the Ozempic - she has been unable to get the [facility].  [facility] to [facility] [address], [place]  Patient number:  [phone]  Last visit with Dr. Ronnald Ramp - 08/06/2021  Last office visit:  01/02/2022  No appointment scheduled

## 2022-02-25 NOTE — Telephone Encounter (Signed)
Pt has been informed that Ozempic could not be Rx'd due to her not having a Dx of diabetes. She expressed understanding.

## 2022-04-02 ENCOUNTER — Telehealth: Payer: Self-pay | Admitting: Neurology

## 2022-04-02 NOTE — Telephone Encounter (Signed)
HST- BCBS no auth req spoke to [name] ref # [id number] on 03/28/22.Marland Kitchen  Patient is scheduled at [facility] for 04/23/22 at 2:30 pm.  Mailed packet to the patient.

## 2022-04-11 ENCOUNTER — Other Ambulatory Visit: Payer: Self-pay | Admitting: Internal Medicine

## 2022-04-11 DIAGNOSIS — I10 Essential (primary) hypertension: Secondary | ICD-10-CM

## 2022-04-23 ENCOUNTER — Other Ambulatory Visit: Payer: Self-pay | Admitting: Internal Medicine

## 2022-04-23 ENCOUNTER — Ambulatory Visit: Payer: BC Managed Care – PPO | Admitting: Neurology

## 2022-04-23 DIAGNOSIS — G4733 Obstructive sleep apnea (adult) (pediatric): Secondary | ICD-10-CM | POA: Diagnosis not present

## 2022-04-23 DIAGNOSIS — D751 Secondary polycythemia: Secondary | ICD-10-CM

## 2022-04-23 DIAGNOSIS — E785 Hyperlipidemia, unspecified: Secondary | ICD-10-CM

## 2022-04-23 DIAGNOSIS — G478 Other sleep disorders: Secondary | ICD-10-CM

## 2022-04-23 DIAGNOSIS — R5382 Chronic fatigue, unspecified: Secondary | ICD-10-CM

## 2022-04-23 DIAGNOSIS — R768 Other specified abnormal immunological findings in serum: Secondary | ICD-10-CM

## 2022-04-23 DIAGNOSIS — R0683 Snoring: Secondary | ICD-10-CM

## 2022-04-23 DIAGNOSIS — G4731 Primary central sleep apnea: Secondary | ICD-10-CM

## 2022-04-24 NOTE — Progress Notes (Signed)
[facility] TEST REPORT ( by Watch PAT)   STUDY DATE:  04-24-2022 DOB:  11/22/1960    ORDERING CLINICIAN:  REFERRING CLINICIAN: Volanda Napoleon Primary Neurologist Dr. Krista Blue   CLINICAL INFORMATION/HISTORY: seen on 01-17-2022 for evaluation of a possible sleep disorder in the setting of anemia , HTN, irregular heart rate and memory changes. The patient reports Nocturia, cervical spine DDD . She reports frequent vivid nightmarish dreams,  excessive fatigue and non restorative sleep.      Epworth sleepiness score: 5 /24.  FSS at 56/ 63 points ( high) GDS at 7/ 15 points, indicative of clinical depression.   BMI: 35.8 kg/m   Neck Circumference: 15"   FINDINGS:   Sleep Summary:   Total Recording Time (hours, min):   8 hours and 8 minutes  Total Sleep Time (hours, min): 7 hours 26 minutes             Percent REM (%): 14%  Sleep latency was 15 minutes and REM sleep latency 179 minutes.                                      Respiratory Indices:   Calculated pAHI (per hour):   20.4/h                          REM pAHI: 5.9/h                                               NREM pAHI: 22.7/h                            Positional AHI:   The patient slept 186 minutes in supine position supine position associated with an AHI of 33.3/h, followed by 162 minutes in prone position with an AHI of 3/h  and 99 minutes of right lateral sleep with an AHI of 25/h.     Snoring reached and snoring reached a mean volume of 41 dB and was present for 31% of total sleep time peaking at over 60 dB in 1 instance.                                            Oxygen Saturation Statistics:   O2 Saturation Range (%):   Between a nadir of 82% and a maximum of 98% saturation, with a mean saturation at 94%.                                    O2 Saturation (minutes) <89%: 2.1 minutes         Pulse Rate Statistics:            Pulse Range:    Between 58 bpm and 103 bpm with a  mean heart rate of 73 bpm.  Please note that this reflects only the heart rate - no data about cardiac rhythm can be extrapolated.  IMPRESSION:  This HST confirms the presence of moderately- severe and possibly central type of sleep apnea , which would correlate with a NREM sleep dominant apnea pattern .  The AHI in REM sleep was only 25% of the AHI in non-REM sleep.  There was snoring associated but no hypoxia of clinical significance was found. Please note that a home sleep test is not equipped to evaluate for limb movements, parasomnia, cardiac arrhythmia, or CO2 retention.    RECOMMENDATION: I recommend for the patient to return for an in lab titration to PAP to investigate the presence of central or complex sleep apnea rather than assuming an obstructive sleep apnea. The benefit of an in-lab titration lies within the ability to change from continuous positive airway pressure which can exacerbate central apnea to a biphasic positive airway pressure or even ASV. By titrating in the sleep lab we also will be able to fit the best interface for this patient with a small oral opening.  The patient's insurance not cover an in sleep lab titration, Plan B would be to use an auto titration CPAP for a time between 30 and 90 days of use and then visit with the patient after gathering the therapeutic data from the auto titration machine.    INTERPRETING PHYSICIAN:   Larey Seat, MD   Medical Director of [facility] at [facility].

## 2022-04-25 DIAGNOSIS — M7541 Impingement syndrome of right shoulder: Secondary | ICD-10-CM | POA: Diagnosis not present

## 2022-04-26 NOTE — Procedures (Signed)
[facility] TEST REPORT ( by Watch PAT)   STUDY DATE:  04-24-2022 DOB:  1960/12/24    ORDERING CLINICIAN:  REFERRING CLINICIAN: Volanda Napoleon Primary Neurologist Dr. Krista Blue   CLINICAL INFORMATION/HISTORY: seen on 01-17-2022 for evaluation of a possible sleep disorder in the setting of anemia , HTN, irregular heart rate and memory changes. The patient reports Nocturia, cervical spine DDD . She reports frequent vivid nightmarish dreams,  excessive fatigue and non restorative sleep.      Epworth sleepiness score: 5 /24.  FSS at 56/ 63 points ( high) GDS at 7/ 15 points, indicative of clinical depression.   BMI: 35.8 kg/m   Neck Circumference: 15"   FINDINGS:   Sleep Summary:   Total Recording Time (hours, min):   8 hours and 8 minutes  Total Sleep Time (hours, min): 7 hours 26 minutes             Percent REM (%): 14%  Sleep latency was 15 minutes and REM sleep latency 179 minutes.                                      Respiratory Indices:   Calculated pAHI (per hour):   20.4/h                          REM pAHI: 5.9/h                                               NREM pAHI: 22.7/h                            Positional AHI:   The patient slept 186 minutes in supine position supine position associated with an AHI of 33.3/h, followed by 162 minutes in prone position with an AHI of 3/h  and 99 minutes of right lateral sleep with an AHI of 25/h.     Snoring reached and snoring reached a mean volume of 41 dB and was present for 31% of total sleep time peaking at over 60 dB in 1 instance.                                            Oxygen Saturation Statistics:   O2 Saturation Range (%):   Between a nadir of 82% and a maximum of 98% saturation, with a mean saturation at 94%.                                    O2 Saturation (minutes) <89%: 2.1 minutes         Pulse Rate Statistics:            Pulse Range:    Between 58 bpm and 103 bpm with a  mean heart rate of 73 bpm.  Please note that this reflects only the heart rate - no data about cardiac rhythm can be extrapolated.  IMPRESSION:  This HST confirms the presence of moderately- severe and possibly central type of sleep apnea , which would correlate with a NREM sleep dominant apnea pattern .  The AHI in REM sleep was only 25% of the AHI in non-REM sleep.  There was snoring associated but no hypoxia of clinical significance was found. Please note that a home sleep test is not equipped to evaluate for limb movements, parasomnia, cardiac arrhythmia, or CO2 retention.    RECOMMENDATION: I recommend for the patient to return for an in lab titration to PAP to investigate the presence of central or complex sleep apnea rather than assuming an obstructive sleep apnea. The benefit of an in-lab titration lies within the ability to change from continuous positive airway pressure which can exacerbate central apnea to a biphasic positive airway pressure or even ASV. By titrating in the sleep lab we also will be able to fit the best interface for this patient with a small oral opening.  The patient's insurance not cover an in sleep lab titration, Plan B would be to use an auto titration CPAP for a time between 30 and 90 days of use and then visit with the patient after gathering the therapeutic data from the auto titration machine.    INTERPRETING PHYSICIAN:   Larey Seat, MD   Medical Director of [facility] at [facility].

## 2022-04-26 NOTE — Addendum Note (Signed)
Addended by: Larey Seat on: 04/26/2022 04:55 PM   Modules accepted: Orders

## 2022-04-29 ENCOUNTER — Telehealth: Payer: Self-pay | Admitting: Neurology

## 2022-04-29 NOTE — Telephone Encounter (Signed)
-----   Message from Larey Seat, MD sent at 04/26/2022  4:55 PM EST ----- This patient's high red blood cell count and fatigue can be related to the apnea found and documented in this HST. I suspect a central apnea and like for this patient to return to the lab for a full night titration.

## 2022-04-29 NOTE — Telephone Encounter (Signed)
I called pt. I advised pt that Dr. Brett Fairy reviewed their sleep study results and found that has sleep apnea and recommends that pt be treated with a cpap. Dr. Brett Fairy recommends that pt return for a repeat sleep study in order to properly titrate the cpap and ensure a good mask fit. Pt is agreeable to returning for a titration study. I advised pt that our sleep lab will file with pt's insurance and call pt to schedule the sleep study when we hear back from the pt's insurance regarding coverage of this sleep study. Pt verbalized understanding of results. Pt had no questions at this time but was encouraged to call back if questions arise. Advised the patient either sleep lab will be in touch to schedule a inlab titration study or I will be in touch if insurance prefers we do a auto cpap. Pt verbalized understanding. Pt had no questions at this time but was encouraged to call back if questions arise.

## 2022-05-01 ENCOUNTER — Telehealth: Payer: Self-pay | Admitting: Hematology and Oncology

## 2022-05-01 NOTE — Telephone Encounter (Signed)
Patient called scheduling to make follow up appointment. Patient hadn't been seen since September 2023. Patient scheduled and notified.

## 2022-05-02 ENCOUNTER — Other Ambulatory Visit: Payer: Self-pay | Admitting: Internal Medicine

## 2022-05-02 DIAGNOSIS — I10 Essential (primary) hypertension: Secondary | ICD-10-CM

## 2022-05-06 ENCOUNTER — Other Ambulatory Visit: Payer: Self-pay | Admitting: Internal Medicine

## 2022-05-06 DIAGNOSIS — F330 Major depressive disorder, recurrent, mild: Secondary | ICD-10-CM

## 2022-05-14 ENCOUNTER — Inpatient Hospital Stay: Payer: BC Managed Care – PPO

## 2022-05-14 ENCOUNTER — Other Ambulatory Visit: Payer: Self-pay | Admitting: Hematology and Oncology

## 2022-05-14 ENCOUNTER — Inpatient Hospital Stay: Payer: BC Managed Care – PPO | Admitting: Hematology and Oncology

## 2022-05-14 DIAGNOSIS — D751 Secondary polycythemia: Secondary | ICD-10-CM

## 2022-05-15 ENCOUNTER — Inpatient Hospital Stay (HOSPITAL_BASED_OUTPATIENT_CLINIC_OR_DEPARTMENT_OTHER): Payer: BC Managed Care – PPO | Admitting: Physician Assistant

## 2022-05-15 ENCOUNTER — Inpatient Hospital Stay: Payer: BC Managed Care – PPO | Attending: Hematology and Oncology

## 2022-05-15 VITALS — BP 120/85 | HR 79 | Temp 97.9°F | Resp 18 | Ht 66.0 in | Wt 208.8 lb

## 2022-05-15 DIAGNOSIS — Z803 Family history of malignant neoplasm of breast: Secondary | ICD-10-CM | POA: Diagnosis not present

## 2022-05-15 DIAGNOSIS — D751 Secondary polycythemia: Secondary | ICD-10-CM | POA: Insufficient documentation

## 2022-05-15 DIAGNOSIS — E538 Deficiency of other specified B group vitamins: Secondary | ICD-10-CM | POA: Diagnosis not present

## 2022-05-15 LAB — CBC WITH DIFFERENTIAL (CANCER CENTER ONLY)
Abs Immature Granulocytes: 0.02 10*3/uL (ref 0.00–0.07)
Basophils Absolute: 0.1 10*3/uL (ref 0.0–0.1)
Basophils Relative: 1 %
Eosinophils Absolute: 0.3 10*3/uL (ref 0.0–0.5)
Eosinophils Relative: 4 %
HCT: 49.8 % — ABNORMAL HIGH (ref 36.0–46.0)
Hemoglobin: 17 g/dL — ABNORMAL HIGH (ref 12.0–15.0)
Immature Granulocytes: 0 %
Lymphocytes Relative: 22 %
Lymphs Abs: 1.6 10*3/uL (ref 0.7–4.0)
MCH: 29.5 pg (ref 26.0–34.0)
MCHC: 34.1 g/dL (ref 30.0–36.0)
MCV: 86.5 fL (ref 80.0–100.0)
Monocytes Absolute: 0.7 10*3/uL (ref 0.1–1.0)
Monocytes Relative: 10 %
Neutro Abs: 4.5 10*3/uL (ref 1.7–7.7)
Neutrophils Relative %: 63 %
Platelet Count: 285 10*3/uL (ref 150–400)
RBC: 5.76 MIL/uL — ABNORMAL HIGH (ref 3.87–5.11)
RDW: 13.6 % (ref 11.5–15.5)
WBC Count: 7.2 10*3/uL (ref 4.0–10.5)
nRBC: 0 % (ref 0.0–0.2)

## 2022-05-15 LAB — CMP (CANCER CENTER ONLY)
ALT: 28 U/L (ref 0–44)
AST: 21 U/L (ref 15–41)
Albumin: 4.2 g/dL (ref 3.5–5.0)
Alkaline Phosphatase: 58 U/L (ref 38–126)
Anion gap: 6 (ref 5–15)
BUN: 13 mg/dL (ref 8–23)
CO2: 34 mmol/L — ABNORMAL HIGH (ref 22–32)
Calcium: 9.8 mg/dL (ref 8.9–10.3)
Chloride: 101 mmol/L (ref 98–111)
Creatinine: 0.81 mg/dL (ref 0.44–1.00)
GFR, Estimated: >60 mL/min (ref >60)
Glucose, Bld: 97 mg/dL (ref 70–99)
Potassium: 4 mmol/L (ref 3.5–5.1)
Sodium: 141 mmol/L (ref 135–145)
Total Bilirubin: 0.6 mg/dL (ref 0.3–1.2)
Total Protein: 6.5 g/dL (ref 6.5–8.1)

## 2022-05-15 NOTE — Progress Notes (Signed)
[place] [phone]   [phone]) [phone]  PROGRESS NOTE  Patient Care Team: Janith Lima, MD as PCP - General (Internal Medicine) Deboraha Sprang, MD (Cardiology) Chucky May, MD (Psychiatry) Monna Fam, MD as Consulting Physician (Ophthalmology)  Hematological/Oncological History  # Iron deficiency anemia of Unclear Etiology  # Polycythemia, Likely 2/2 to OSA 1) Labs from PCP, Dr. Scarlette Calico -09/21/2020: WBC 7.8, Hgb 10.5 (L), MCV 64.5 (L), Plt 438 (H), Iron 19 (L), Ferritin 6 (L), Vitamin B12 186 (L), Folate 12.9.   2) 09/25/2020: Initiated weekly vitamin B12 injections with PCP.  3) 10/04/2020: Establish care with Dede Query PA-C  4) 10/27/2020-11/24/2020: Received IV venofer x 5 doses   HISTORY OF PRESENTING ILLNESS:  [name] [name] 62 y.o. female returns today for a follow up for history of iron deficiency and secondary polycythemia.   At today's visit, Ms. Fray reports she still struggles with fatigue that can impact her daily activities. She still works full time. She reports a good appetite. She denies nausea, vomiting or abdominal pain. She denies any bowel habits changes. She does have some chronic mid back pain with etiology unknown. She reports slightly red rash diffusely involved that is itchy and present for the last 3-4 months. She denies any exposure of new lotions, soaps or detergents. She reports having occasional episodes of dizziness without any syncopal episodes. She denies fevers, chills, sweats, shortness of breath, chest pain or cough. Rest of the 10 point ROS is below.   MEDICAL HISTORY:  Past Medical History:  Diagnosis Date   Anemia    Anxiety    Bigeminy    Cognitive changes    Depression    Dysrhythmia    bigeminy   Elevated liver enzymes 2017   undiagnosed and levels recently were more normal per pt   Erythromelalgia ([place])    Hyperlipidemia    Hypertension    IBS (irritable bowel syndrome)     Migraine, unspecified, without mention of intractable migraine without mention of status migrainosus    PONV (postoperative nausea and vomiting)    Tachycardia, unspecified    on BBloc    SURGICAL HISTORY: Past Surgical History:  Procedure Laterality Date   Bilateral myofascial release for recurrent plantar faciitis     BLADDER SUSPENSION N/A 08/09/2015   Procedure: TRANSVAGINAL TAPE (TVT) PROCEDURE;  Surgeon: Bobbye Charleston, MD;  Location: [facility];  Service: Gynecology;  Laterality: N/A;   CARPAL TUNNEL RELEASE     Colonoscopy with polypectomy  03/18/2014   Repeat 2021   CYSTOCELE REPAIR  08/09/2015   Procedure: ANTERIOR REPAIR (CYSTOCELE);  Surgeon: Bobbye Charleston, MD;  Location: [facility];  Service: Gynecology;;   CYSTOSCOPY N/A 08/09/2015   Procedure: Consuela Mimes;  Surgeon: Bobbye Charleston, MD;  Location: [facility];  Service: Gynecology;  Laterality: N/A;   GIVENS CAPSULE STUDY N/A 10/24/2020   Procedure: GIVENS CAPSULE STUDY;  Surgeon: Juanita Craver, MD;  Location: [facility];  Service: Endoscopy;  Laterality: N/A;   ROBOTIC ASSISTED TOTAL HYSTERECTOMY WITH BILATERAL SALPINGO OOPHERECTOMY Bilateral 08/09/2015   Procedure: ROBOTIC ASSISTED TOTAL HYSTERECTOMY WITH BILATERAL SALPINGO OOPHORECTOMY;  Surgeon: Bobbye Charleston, MD;  Location: [facility];  Service: Gynecology;  Laterality: Bilateral;   TRIGGER FINGER RELEASE     TUBAL LIGATION      SOCIAL HISTORY: Social History   Socioeconomic History   Marital status: Married    Spouse name: Not on file   Number of children: 2   Years of education: 37  Highest education level: High school graduate  Occupational History   Occupation: customer service/sales  Tobacco Use   Smoking status: Never   Smokeless tobacco: Never  Vaping Use   Vaping Use: Never used  Substance and Sexual Activity   Alcohol use: Yes    Comment: occasional wine   Drug use: No   Sexual activity: Yes    Partners: Male  Other Topics Concern   Not on file   Social History Narrative   HSG. 1 year [employer]. Married '86-divorced after 47yr; remarried  '96. No children, 2 step children. 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 Marriage is stable.      Lives at home with husband.   Right-handed.   One Diet Coke per day, 2 cups coffee per day.   Social Determinants of Health   Financial Resource Strain: Not on file  Food Insecurity: Not on file  Transportation Needs: Not on file  Physical Activity: Not on file  Stress: Not on file  Social Connections: Not on file  Intimate Partner Violence: Not on file    FAMILY HISTORY: Family History  Problem Relation Age of Onset   Cancer Mother        breast cancer-left mastectomy   Asthma Mother    Alzheimer's disease Mother    Transient ischemic attack Mother    Other Mother        ITP   Hypertension Father    Hyperlipidemia Father    Diabetes Father    Heart attack Father 616      CABG   Heart failure Father    Pancreatitis Father    Chronic fatigue Sister    Fibromyalgia Sister    Alzheimer's disease Maternal Grandmother    Breast cancer Maternal Grandmother     ALLERGIES:  is allergic to enalapril, codeine, and macrobid [nitrofurantoin].  MEDICATIONS:  Current Outpatient Medications  Medication Sig Dispense Refill   aspirin EC 81 MG tablet Take 81 mg by mouth daily. Swallow whole.     atorvastatin (LIPITOR) 10 MG tablet TAKE 1 TABLET BY MOUTH EVERY DAY 90 tablet 0   buPROPion (WELLBUTRIN XL) 150 MG 24 hr tablet TAKE 1 TABLET (150 MG) BY MOUTH DAILY 90 tablet 1   Carboxymethylcellulose Sodium (EYE DROPS OP) Place 1 drop into both eyes daily. replens     DULoxetine (CYMBALTA) 60 MG capsule TAKE 1 CAPSULE BY MOUTH EVERY DAY 90 capsule 1   estradiol (ESTRACE) 1 MG tablet Take 1 mg by mouth daily.     indapamide (LOZOL) 1.25 MG tablet TAKE 1 TABLET BY MOUTH EVERY DAY 90 tablet 0   metoprolol tartrate (LOPRESSOR) 25 MG tablet TAKE 1  TABLET(25 MG) BY MOUTH TWICE DAILY 60 tablet 0   Naproxen Sodium (ALEVE PO) Take 2 tablets by mouth every 12 (twelve) hours.     omeprazole (PRILOSEC) 20 MG capsule Take 20 mg by mouth daily.     No current facility-administered medications for this visit.    REVIEW OF SYSTEMS:   Constitutional: ( - ) fevers, ( - )  chills , ( - ) night sweats Eyes: ( - ) blurriness of vision, ( - ) double vision, ( - ) watery eyes Ears, nose, mouth, throat, and face: ( - ) mucositis, ( - ) sore throat Respiratory: ( - ) cough, ( -) dyspnea, ( - ) wheezes Cardiovascular: ( -) palpitation, ( - ) chest discomfort, ( - )  lower extremity swelling Gastrointestinal:  ( - ) nausea, ( - ) heartburn, ( - ) change in bowel habits Skin: ( - ) abnormal skin rashes Lymphatics: ( - ) new lymphadenopathy, ( - ) easy bruising Neurological: ( - ) numbness, ( - ) tingling, ( - ) new weaknesses Behavioral/Psych: ( - ) mood change, ( - ) new changes  All other systems were reviewed with the patient and are negative.  PHYSICAL EXAMINATION: ECOG PERFORMANCE STATUS: 1 - Symptomatic but completely ambulatory  Vitals:   05/15/22 1030  BP: 120/85  Pulse: 79  Resp: 18  Temp: 97.9 F (36.6 C)  SpO2: 98%   Filed Weights   05/15/22 1030  Weight: 208 lb 12.8 oz (94.7 kg)    GENERAL: well appearing female in NAD  SKIN: skin color, texture, turgor are normal, EYES: conjunctiva are pink and non-injected, sclera clear OROPHARYNX: no exudate, no erythema; lips, buccal mucosa, and tongue normal  LUNGS: clear to auscultation and percussion with normal breathing effort HEART: regular rate & rhythm and no murmurs and no lower extremity edema Musculoskeletal: no cyanosis of digits and no clubbing  PSYCH: alert & oriented x 3, fluent speech NEURO: no focal motor/sensory deficits  LABORATORY DATA:  I have reviewed the data as listed    Latest Ref Rng & Units 05/15/2022    9:32 AM 12/03/2021    2:27 PM 07/23/2021   11:46 AM   CBC  WBC 4.0 - 10.5 K/uL 7.2  9.2  8.1   Hemoglobin 12.0 - 15.0 g/dL 17.0  16.9  17.6   Hematocrit 36.0 - 46.0 % 49.8  50.8  52.0   Platelets 150 - 400 K/uL 285  328  309        Latest Ref Rng & Units 05/15/2022    9:32 AM 12/03/2021    2:27 PM 07/23/2021   11:46 AM  CMP  Glucose 70 - 99 mg/dL 97  120  105   BUN 8 - 23 mg/dL '13  15  12   '$ Creatinine 0.44 - 1.00 mg/dL 0.81  0.84  0.82   Sodium 135 - 145 mmol/L 141  139  137   Potassium 3.5 - 5.1 mmol/L 4.0  3.0  3.8   Chloride 98 - 111 mmol/L 101  100  104   CO2 22 - 32 mmol/L 34  31  26   Calcium 8.9 - 10.3 mg/dL 9.8  10.0  9.7   Total Protein 6.5 - 8.1 g/dL 6.5  7.3  6.9   Total Bilirubin 0.3 - 1.2 mg/dL 0.6  0.6  0.6   Alkaline Phos 38 - 126 U/L 58  64  58   AST 15 - 41 U/L '21  22  25   '$ ALT 0 - 44 U/L 28  29  33      ASSESSMENT & PLAN DAYLI PRIESTER is a female who returns for a follow up.  #Polycythemia, secondary: --Hgb was elevated since receiving IV iron back in August/September 2022. Patient is not taking oral iron at this time. She denies smoking, OSA or dehydration.  --Workup included erythropoietin levels, BCR/ABL FISH, MPN panel.  All were unremarkable. --Labs today show hemoglobin 17.0, hematocrit 49.8, white blood cell count 7.2, and platelets of 285. --Patient was recent diagnosed with sleep apnea this past month which is most likely the underlying cause. Patient is awaiting for lab titration and get fitted for CPAP machine. --Continue to take ASA 81 mg daily. --Discussed that  findings do not suggest an underlying bone marrow disorder causing the polycythemia so do not recommend bone marrow biopsy at this time.  --Offered patient referral to another hematologist for a second opinion. Patient has agreed and we arrange consultation.   #Iron deficiency anemia, resolved: --Uncertain etiology as patient denies any signs of bleeding.  --Patient underwent hysterectomy in 2017.  --Patient underwent endoscopy and  colonoscopy on 07/14/2020 with Dr. Juanita Craver. Colonoscopy revealed small sessile polyp in the mid-sigmoid colon. EGD was unremarkable. --Underwent capsular endoscopy on 10/24/2020 which showed normal appearing small bowel without any source of bleeding.  --Received IV venofer x 5 doses from 10/27/2020-11/24/2020 --Most recent labs from 12/03/2021 showed no deficiency.    #Vitamin B12 deficiency, improved: --Most recent vitamin B12 level from 04/05/2021 was 702.  --Received vitamin B12 injections 1000 mcg x 4 doses. Last dose was on 11/10/2020. --Okay to hold addition B12 injections.   Follow up: -6 months with repeat labs.   No orders of the defined types were placed in this encounter.   All questions were answered. The patient knows to call the clinic with any problems, questions or concerns.  I have spent a total of 30 minutes minutes of face-to-face and non-face-to-face time, preparing to see the patient, performing a medically appropriate examination, counseling and educating the patient, ordering tests, documenting clinical information in the electronic health record, and care coordination.   Dede Query PA-C Dept of Hematology and [facility] Phone: [phone]

## 2022-05-16 ENCOUNTER — Telehealth: Payer: Self-pay | Admitting: Internal Medicine

## 2022-05-16 ENCOUNTER — Telehealth: Payer: Self-pay

## 2022-05-16 NOTE — Telephone Encounter (Signed)
Per 2/28 LOS reached out to patient to schedule, patient is aware of date and time of appointments.

## 2022-05-16 NOTE — Telephone Encounter (Signed)
Can you reach out to patient and see if she wants a second opinion from [facility] hematologist like Dr. Irene Limbo or from [facility]? We can do either   IT  pt advised and wants to give it some thought and will call back if she wants to move forward with a second opinion

## 2022-05-27 ENCOUNTER — Other Ambulatory Visit: Payer: Self-pay | Admitting: Internal Medicine

## 2022-05-27 ENCOUNTER — Telehealth: Payer: Self-pay | Admitting: Neurology

## 2022-05-27 DIAGNOSIS — I10 Essential (primary) hypertension: Secondary | ICD-10-CM

## 2022-05-27 NOTE — Telephone Encounter (Signed)
CPAP- [employer] no auth req spoke to [place] ref # [id number]   Patient is scheduled at [facility] for 05/28/22 at 8 pm.  She is aware to bring some comfortable clothes to sleep in, eat her evening meal before arriving and bring any medication with her if she takes any at night time.

## 2022-05-28 ENCOUNTER — Ambulatory Visit (INDEPENDENT_AMBULATORY_CARE_PROVIDER_SITE_OTHER): Payer: BC Managed Care – PPO | Admitting: Neurology

## 2022-05-28 DIAGNOSIS — G4733 Obstructive sleep apnea (adult) (pediatric): Secondary | ICD-10-CM

## 2022-05-28 DIAGNOSIS — G478 Other sleep disorders: Secondary | ICD-10-CM

## 2022-05-28 DIAGNOSIS — R0683 Snoring: Secondary | ICD-10-CM

## 2022-05-28 DIAGNOSIS — E669 Obesity, unspecified: Secondary | ICD-10-CM

## 2022-05-28 DIAGNOSIS — G4731 Primary central sleep apnea: Secondary | ICD-10-CM

## 2022-05-28 DIAGNOSIS — R768 Other specified abnormal immunological findings in serum: Secondary | ICD-10-CM

## 2022-05-28 DIAGNOSIS — R5382 Chronic fatigue, unspecified: Secondary | ICD-10-CM

## 2022-05-28 DIAGNOSIS — E66811 Obesity, class 1: Secondary | ICD-10-CM

## 2022-05-28 DIAGNOSIS — D751 Secondary polycythemia: Secondary | ICD-10-CM

## 2022-06-05 ENCOUNTER — Encounter: Payer: Self-pay | Admitting: Internal Medicine

## 2022-06-05 ENCOUNTER — Ambulatory Visit (INDEPENDENT_AMBULATORY_CARE_PROVIDER_SITE_OTHER): Payer: BC Managed Care – PPO | Admitting: Internal Medicine

## 2022-06-05 VITALS — BP 128/78 | HR 61 | Temp 97.8°F | Ht 66.0 in | Wt 206.0 lb

## 2022-06-05 DIAGNOSIS — Z0000 Encounter for general adult medical examination without abnormal findings: Secondary | ICD-10-CM | POA: Diagnosis not present

## 2022-06-05 DIAGNOSIS — E669 Obesity, unspecified: Secondary | ICD-10-CM | POA: Diagnosis not present

## 2022-06-05 DIAGNOSIS — D510 Vitamin B12 deficiency anemia due to intrinsic factor deficiency: Secondary | ICD-10-CM

## 2022-06-05 DIAGNOSIS — D508 Other iron deficiency anemias: Secondary | ICD-10-CM | POA: Diagnosis not present

## 2022-06-05 DIAGNOSIS — Z0001 Encounter for general adult medical examination with abnormal findings: Secondary | ICD-10-CM

## 2022-06-05 DIAGNOSIS — I10 Essential (primary) hypertension: Secondary | ICD-10-CM | POA: Diagnosis not present

## 2022-06-05 LAB — IBC + FERRITIN
Ferritin: 57.1 ng/mL (ref 10.0–291.0)
Iron: 54 ug/dL (ref 42–145)
Saturation Ratios: 16.6 % — ABNORMAL LOW (ref 20.0–50.0)
TIBC: 326.2 ug/dL (ref 250.0–450.0)
Transferrin: 233 mg/dL (ref 212.0–360.0)

## 2022-06-05 LAB — VITAMIN B12: Vitamin B-12: 991 pg/mL — ABNORMAL HIGH (ref 211–911)

## 2022-06-05 LAB — FOLATE: Folate: 6.2 ng/mL (ref >5.9)

## 2022-06-05 LAB — TSH: TSH: 2.37 u[IU]/mL (ref 0.35–5.50)

## 2022-06-05 LAB — HEMOGLOBIN A1C: Hgb A1c MFr Bld: 5.2 % (ref 4.6–6.5)

## 2022-06-05 NOTE — Progress Notes (Unsigned)
Subjective:  Patient ID: [name] [name], female    DOB: [birth year]  Age: 62 y.o. MRN: [id number]  CC: Annual Exam, Hypertension, Diabetes, and Rash   HPI PERSIS BRAUNER presents for a CPX and f/up ---  She continues to complain of an itchy rash.  She is not treating it.  She complains of frequent falls, dizziness, blurred vision, anxiety, crying spells, and anhedonia. She complains of chronic fatigue and worsening memory.  Outpatient Medications Prior to Visit  Medication Sig Dispense Refill   aspirin EC 81 MG tablet Take 81 mg by mouth daily. Swallow whole.     atorvastatin (LIPITOR) 10 MG tablet TAKE 1 TABLET BY MOUTH EVERY DAY 90 tablet 0   buPROPion (WELLBUTRIN XL) 150 MG 24 hr tablet TAKE 1 TABLET (150 MG) BY MOUTH DAILY 90 tablet 1   Carboxymethylcellulose Sodium (EYE DROPS OP) Place 1 drop into both eyes daily. replens     DULoxetine (CYMBALTA) 60 MG capsule TAKE 1 CAPSULE BY MOUTH EVERY DAY 90 capsule 1   estradiol (ESTRACE) 1 MG tablet Take 1 mg by mouth daily.     indapamide (LOZOL) 1.25 MG tablet TAKE 1 TABLET BY MOUTH EVERY DAY 90 tablet 0   metoprolol tartrate (LOPRESSOR) 25 MG tablet TAKE 1 TABLET(25 MG) BY MOUTH TWICE DAILY 180 tablet 0   Naproxen Sodium (ALEVE PO) Take 2 tablets by mouth every 12 (twelve) hours.     omeprazole (PRILOSEC) 20 MG capsule Take 20 mg by mouth daily.     No facility-administered medications prior to visit.    ROS Review of Systems  Constitutional:  Positive for fatigue. Negative for appetite change, chills, diaphoresis and unexpected weight change.  HENT: Negative.    Eyes:  Positive for visual disturbance.  Respiratory:  Negative for apnea, cough, shortness of breath and wheezing.   Cardiovascular:  Negative for chest pain, palpitations and leg swelling.  Gastrointestinal:  Negative for abdominal pain, constipation, diarrhea, nausea and vomiting.  Endocrine: Negative.   Genitourinary: Negative.  Negative for difficulty  urinating.  Musculoskeletal:  Positive for arthralgias. Negative for myalgias.  Skin:  Positive for rash. Negative for color change.  Neurological:  Negative for dizziness, weakness and headaches.  Hematological:  Negative for adenopathy. Does not bruise/bleed easily.  Psychiatric/Behavioral:  Positive for decreased concentration, dysphoric mood and sleep disturbance. Negative for behavioral problems, confusion and suicidal ideas. The patient is nervous/anxious. The patient is not hyperactive.     Objective:  BP 128/78 (BP Location: Left Arm, Patient Position: Sitting, Cuff Size: Large)   Pulse 61   Temp 97.8 F (36.6 C) (Oral)   Ht 5\' 6"  (1.676 m)   Wt 206 lb (93.4 kg)   LMP  (LMP Unknown)   SpO2 97%   BMI 33.25 kg/m   BP Readings from Last 3 Encounters:  06/05/22 128/78  05/15/22 120/85  01/17/22 (!) 142/82    Wt Readings from Last 3 Encounters:  06/05/22 206 lb (93.4 kg)  05/15/22 208 lb 12.8 oz (94.7 kg)  01/17/22 222 lb (100.7 kg)    Physical Exam Vitals reviewed.  Constitutional:      Appearance: She is obese.  HENT:     Nose: Nose normal.     Mouth/Throat:     Mouth: Mucous membranes are moist.  Eyes:     General: No scleral icterus.    Conjunctiva/sclera: Conjunctivae normal.  Cardiovascular:     Rate and Rhythm: Normal rate and regular rhythm.  Pulses: Normal pulses.     Heart sounds: No murmur heard.    No gallop.  Pulmonary:     Effort: Pulmonary effort is normal.     Breath sounds: No stridor. No wheezing, rhonchi or rales.  Abdominal:     General: Abdomen is protuberant. Bowel sounds are normal. There is no distension.     Palpations: Abdomen is soft. There is no hepatomegaly, splenomegaly or mass.  Musculoskeletal:        General: Normal range of motion.     Cervical back: Neck supple.     Right lower leg: No edema.     Left lower leg: No edema.  Lymphadenopathy:     Cervical: No cervical adenopathy.  Skin:    General: Skin is warm and  dry.     Coloration: Skin is not pale.     Findings: Rash present.  Neurological:     General: No focal deficit present.     Mental Status: Mental status is at baseline.  Psychiatric:        Mood and Affect: Mood normal.        Behavior: Behavior normal.        Thought Content: Thought content normal.        Judgment: Judgment normal.     Lab Results  Component Value Date   WBC 7.2 05/15/2022   HGB 17.0 (H) 05/15/2022   HCT 49.8 (H) 05/15/2022   PLT 285 05/15/2022   GLUCOSE 97 05/15/2022   CHOL 155 08/06/2021   TRIG 135.0 08/06/2021   HDL 41.10 08/06/2021   LDLCALC 87 08/06/2021   ALT 28 05/15/2022   AST 21 05/15/2022   NA 141 05/15/2022   K 4.0 05/15/2022   CL 101 05/15/2022   CREATININE 0.81 05/15/2022   BUN 13 05/15/2022   CO2 34 (H) 05/15/2022   TSH 2.37 06/05/2022   HGBA1C 5.2 06/05/2022   MICROALBUR <0.7 05/08/2021    US Abdomen Limited RUQ (LIVER/GB)  Result Date: 07/23/2021 CLINICAL DATA:  Right upper quadrant abdominal pain. EXAM: ULTRASOUND ABDOMEN LIMITED RIGHT UPPER QUADRANT COMPARISON:  None Available. FINDINGS: Gallbladder: No gallstones or wall thickening visualized. No sonographic Murphy sign noted by sonographer. Common bile duct: Diameter: 5 mm Liver: The liver demonstrates coarse echotexture and increased echogenicity, likely reflecting diffuse steatosis. No overt cirrhotic contour abnormalities or focal lesions are identified. There is no evidence of intrahepatic biliary ductal dilatation. Portal vein is patent on color Doppler imaging with normal direction of blood flow towards the liver. Other: None. IMPRESSION: Evidence of hepatic steatosis. Electronically Signed   By: Aletta Edouard M.D.   On: 07/23/2021 12:56    Assessment & Plan:  Essential hypertension -     TSH; Future  Vitamin B12 deficiency anemia due to intrinsic factor deficiency -     Vitamin B12; Future -     Folate; Future  Obesity (BMI 30.0-34.9) -     Hemoglobin A1c;  Future  Other iron deficiency anemia -     IBC + Ferritin; Future  Encounter for general adult medical examination with abnormal findings     Follow-up: Return in about 6 months (around 12/06/2022).  Scarlette Calico, MD

## 2022-06-05 NOTE — Patient Instructions (Signed)

## 2022-06-05 NOTE — Procedures (Signed)
CPAP TITRATION STUDY AT [facility]  PATIENT NAME:  [name] [name]. Stillion         DATE OF BIRTH:  1960/10/09  PATIENT ID:  [id number]    TYPE OF STUDY:  CPAP  READING PHYSICIAN: Larey Seat, MD REFERRING PHYSICIAN:  SCORING TECHNICIAN: Gaylyn Cheers, RPSGT   HISTORY: This 62year-old Female patient returns for a full night PAP titration. A HST from 04/23/2022 had revealed an overall AHI of 20.4/h , REM AHI of 6/h and NREM AHI at 23/h. supine sleep was associated with an AHI of 33/h and prone sleep with 3/h. This NREM to REM sleep distribution raised the question of central apnea being present.    The Epworth Sleepiness Scale was endorsed at 5 out of 24 points (scores above or equal to 10 are suggestive of hypersomnolence).  FSS at 56/ 63 points. GDS endorsed at 7/ 15 points.   ADDITIONAL INFORMATION:  Height: 66.0 in Weight: 222 lbs (BMI 35.8) Neck Size: 15.0 in    MEDICATIONS: Aspirin, Lipitor, Wellbutrin, eye drops, Cymbalta, Estrace, Lozol, Lopressor, Aleve, Prilosec  DESCRIPTION: A RPSGT - sleep technologist was in attendance for the duration of the recording.  Data collection, scoring, video monitoring, and reporting were performed in compliance with the AASM Manual for the Scoring of Sleep and Associated Events; (Hypopnea is scored based on the criteria listed in Section VIII D. 1b in the AASM Manual V2.6 using a 4% oxygen desaturation rule or Hypopnea is scored based on the criteria listed in Section VIII D. 1a in the AASM Manual V2.6 using 3% oxygen desaturation and /or arousal rule).  A physician certified by the American Board of Sleep Medicine reviewed each epoch of the study.  The study began as a titration at 5 cm H20 CPAP and used an ESON nasal mask in small size. CPAP reached a final pressure of 8 cm water with an AHI of 0.5/h.   SLEEP CONTINUITY AND SLEEP ARCHITECTURE:  Lights off was at 20:53: and lights on 05:02: (8.2 hours in bed). Total sleep time was 374.0 minutes, with a  decreased sleep efficiency at 76.5%. Wake after sleep onset (WASO) time accounted for 66 minutes. Sleep latency was increased at 49.0 minutes.   Of the total sleep time, the percentage of stage N1 sleep was 9.5%, stage N2 sleep was 78.3%, stage N3 sleep was 9.8%, and REM sleep was 2.4%.   BODY POSITION: Duration of total sleep and percent of total sleep in their respective position is as follows: supine 94 minutes (25.3%), non-supine 279.5 minutes (74.7%); right 180 minutes (48.1%), left 99 minutes (26.6%), and prone 00 minutes (0.0%). AHI in supine sleep was 0.0/h .    RESPIRATORY MONITORING:  Based on CMS criteria (using a 4% oxygen desaturation rule for scoring hypopneas), there were 0 apneas (0 obstructive; 0 central; 0 mixed), and 14 hypopneas.  Apnea index was 0.0/h. Hypopnea index was 2.2/h.  The apnea-hypopnea index was 2.2/h overall (0.0 supine, 0.0 non-supine; 0.0 REM, 0.0 supine REM).   Based on AASM criteria (using a 3% oxygen desaturation and /or arousal rule for scoring hypopneas), there were 0 apneas (0 obstructive; 0 central; 0 mixed), and 18 hypopneas. Apnea index was 0.0. Hypopnea index was 2.9. The apnea-hypopnea index was 2.9 overall (0.0 supine, 0.0 non-supine; 0.0 REM, 0.0 supine REM).  OXIMETRY: Total sleep time spent at, or below 88% was 0.4 minutes, or 0.1% of total sleep time. Oxygen desaturations reached a nadir during sleep at 81% from a mean  of 94%.  LIMB MOVEMENTS: There were 110 periodic limb movements of sleep (17.6/h), of which 27 (4.3/h) were associated with an arousal. AROUSALS: There were 135 arousals in total, for an arousal index of 21.7 arousals/hour.  Of these, 10 were identified as respiratory-related arousals (1.6 /h), 27 were PLM-related arousals (4.3 /h), 108 were non-specific arousals (17.3 /h) and there were additional microarousals.   EKG:  The average heart rate during sleep was 76 bpm.  The minimum heart rate during sleep was 68 bpm. The maximum heart  rate during recording was 97 bpm in NSR.      Interpretation: the initially heavily fragmented sleep architecture recovered to a more continuous sleep under CPAP and REM sleep rebounded briefly.  No central apnea was seen. There were many PLM related arousals present.    RECOMMENDATIONS: The small Eson nasal mask was well tolerated, and a final CPAP pressure of 8 cm water is recommended. The patient spent little time in supine sleep and had no apneas or hypopneas during supine sleep.   Autotitration CPAP ResMed device between 5-15 cm water pressure, 2 cm EPR , heated humidification and small size Eson nasal mask or a nasal cradle. The patient may prefer a set up that allows prone sleep.   RV with Epworth and FSS scores to be obtained.     Larey Seat, MD             [facility] CPAP Summary    General Information  Name: [name] [name]  BMI: [id number] Physician: Larey Seat, MD  ID: [id number] Height: 66.0 in Technician: Gaylyn Cheers, RPSGT  Sex: Female Weight: 222.0 lb Record: [id number]  Age: 62 [October 05, 1960] Date: 05/28/2022     Medical & Medication History    ELBERTA CALIA today, a right-handed White or Caucasian female with a possible sleep disorder. She has a past medical history of Anemia, Anxiety, Bigeminy, Cognitive changes, Depression, Dysrhythmia, Elevated liver enzymes (2017), Erythromelalgia ([place]), Hyperlipidemia, Hypertension, IBS (irritable bowel syndrome), Migraine, unspecified, without mention of intractable migraine without mention of status migrainosus, PONV (postoperative nausea and vomiting), and Tachycardia, unspecified. Sleep relevant medical history: Nocturia 2-3 times at night/ , DDD cervical spine, disc replacement. Family medical /sleep history: father had cardiac disease, CAD- mother had CVAs. Sleep habits are as follows: The patient's dinner time is between 5.30-6.30PM. The patient goes to bed at 9 PM and  continues to sleep for 8 hours, wakes for 2 bathroom breaks, the first time at 12-1 AM. 5.15 AM is the usual rise time. The patient wakes up spontaneously before the alarm. She reports not feeling refreshed or restored in AM, with symptoms such as dry mouth, morning headaches, and residual fatigue.Naps are taken frequently, lasting from 1 to 2 hours and are not more refreshing than nocturnal sleep. ESS Total = 5/ 24 points FSS endorsed at 56/ 63 points.  Aspirin, Lipitor, Wellbutrin, eye drops, Cymbalta, Estrace, Lozol, Lopressor, Aleve, Prilosec   Sleep Disorder      Comments   Patient arrived for a CPAP titration polysomnogram. Procedure explained and all questions answered. Patient was shown CPAP at 5cm/H20 using a small Eson 2 nasal mask and a small Vitera full face mask prior to setup. Standard paste setup without complications. CPAP was started at 5cm using the small Eson 2 nasal mask. Patient slept left, right, and supine. No significant snoring was heard. Respiratory events observed. CPAP was increased to 8 cm in an effort to control obstructive respiratory  events and abolish snoring. No obvious cardiac arrhythmias observed. No significant PLMS observed. One restroom visit.    CPAP start time: 08:53:28 PM CPAP end time: 05:02:17 AM   Time Total Supine Side Prone Upright  Recording (TRT) 8h 9.29m 2h 53.59m 5h 15.91m 0h 0.56m 0h 0.43m  Sleep (TST) 6h 14.85m 1h 34.58m 4h 39.52m 0h 0.46m 0h 0.5m   Latency N1 N2 N3 REM Onset Per. Slp. Eff.  Actual 0h 49.80m 0h 52.6m 1h 54.50m 5h 43.72m 0h 49.64m 0h 50.28m 76.48%   Stg Dur Wake N1 N2 N3 REM  Total 115.0 35.5 293.0 36.5 9.0  Supine 79.0 3.5 65.5 25.5 0.0  Side 36.0 32.0 227.5 11.0 9.0  Prone 0.0 0.0 0.0 0.0 0.0  Upright 0.0 0.0 0.0 0.0 0.0   Stg % Wake N1 N2 N3 REM  Total 23.5 9.5 78.3 9.8 2.4  Supine 16.2 0.9 17.5 6.8 0.0  Side 7.4 8.6 60.8 2.9 2.4  Prone 0.0 0.0 0.0 0.0 0.0  Upright 0.0 0.0 0.0 0.0 0.0     Apnea Summary Sub Supine Side Prone  Upright  Total 0 Total 0 0 0 0 0    REM 0 0 0 0 0    NREM 0 0 0 0 0  Obs 0 REM 0 0 0 0 0    NREM 0 0 0 0 0  Mix 0 REM 0 0 0 0 0    NREM 0 0 0 0 0  Cen 0 REM 0 0 0 0 0    NREM 0 0 0 0 0   Rera Summary Sub Supine Side Prone Upright  Total 0 Total 0 0 0 0 0    REM 0 0 0 0 0    NREM 0 0 0 0 0   Hypopnea Summary Sub Supine Side Prone Upright  Total 18 Total 18 0 18 0 0    REM 0 0 0 0 0    NREM 18 0 18 0 0   4% Hypopnea Summary Sub Supine Side Prone Upright  Total (4%) 14 Total 14 0 14 0 0    REM 0 0 0 0 0    NREM 14 0 14 0 0     AHI Total Obs Mix Cen  2.89 Apnea 0.00 0.00 0.00 0.00   Hypopnea 2.89 -- -- --  2.25 Hypopnea (4%) 2.25 -- -- --    Total Supine Side Prone Upright  Position AHI 2.89 0.00 3.86 0.00 0.00  REM AHI 0.00   NREM AHI 2.96   Position RDI 2.89 0.00 3.86 0.00 0.00  REM RDI 0.00   NREM RDI 2.96    4% Hypopnea Total Supine Side Prone Upright  Position AHI (4%) 2.25 0.00 3.01 0.00 0.00  REM AHI (4%) 0.00   NREM AHI (4%) 2.30   Position RDI (4%) 2.25 0.00 3.01 0.00 0.00  REM RDI (4%) 0.00   NREM RDI (4%) 2.30    Desaturation Information  <100% <90% <80% <70% <60% <50% <40%  Supine 4 0 0 0 0 0 0  Side 29 3 0 0 0 0 0  Prone 0 0 0 0 0 0 0  Upright 0 0 0 0 0 0 0  Total 33 3 0 0 0 0 0  Desaturation threshold setting: 4% Minimum desaturation setting: 10 seconds SaO2 nadir: 78% The longest event was a 22 sec obstructive Hypopnea with a minimum SaO2 of 88%. The lowest SaO2 was 87% associated with a 13  sec obstructive Hypopnea. EKG Rates EKG Avg Max Min  Awake 78 97 65  Asleep 76 91 68  EKG Events: none Awakening/Arousal Information # of Awakenings 31  Wake after sleep onset 66.55m  Wake after persistent sleep 65.65m   Arousal Assoc. Arousals Index  Apneas 0 0.0  Hypopneas 10 1.6  Leg Movements 36 5.8  Snore 0.0 0.0  PTT Arousals 0 0.0  Spontaneous 108 17.3  Total 154 24.7  Myoclonus Information PLMS LMs Index  Total LMs during PLMS 110 17.6   LMs w/ Microarousals 27 4.3   LM LMs Index  w/ Microarousal 9 1.4  w/ Awakening 4 0.6  w/ Resp Event 0 0.0  Spontaneous 25 4.0  Total 34 5.5

## 2022-06-05 NOTE — Addendum Note (Signed)
Addended by: Larey Seat on: 06/05/2022 06:53 PM   Modules accepted: Orders

## 2022-06-06 ENCOUNTER — Encounter: Payer: Self-pay | Admitting: Physician Assistant

## 2022-06-06 ENCOUNTER — Encounter: Payer: Self-pay | Admitting: Hematology and Oncology

## 2022-06-06 ENCOUNTER — Encounter: Payer: Self-pay | Admitting: Internal Medicine

## 2022-06-06 ENCOUNTER — Telehealth: Payer: Self-pay | Admitting: Neurology

## 2022-06-06 NOTE — Telephone Encounter (Signed)
-----   Message from Larey Seat, MD sent at 06/05/2022  6:53 PM EDT ----- This CPAP titration did cause a lot of discomfort for the patient- I would like to thank her for holding up. The treatment of sleep apnea/ hypopnea by CPAP at 8 cm water eliminated nearly all events as the AHI became less than 1/h.  There were frequent limb movements and many let to arousals, and there was a small proportion of REM sleep captured, following a long REM latency. No central apneas were seen.

## 2022-06-06 NOTE — Telephone Encounter (Signed)
Called patient to discuss sleep study results. No answer at this time. LVM for the patient to call back.  Will send a mychart message as well. 

## 2022-06-17 ENCOUNTER — Ambulatory Visit: Payer: BC Managed Care – PPO | Admitting: Internal Medicine

## 2022-06-17 NOTE — Progress Notes (Deleted)
Office Visit Note  Patient: [name] [name]             Date of Birth: May 07, 1960           MRN: [id number]             PCP: Janith Lima, MD Referring: Janith Lima, MD Visit Date: 06/17/2022   Subjective:  No chief complaint on file.   History of Present Illness: [name] [name] is a 62 y.o. female here for follow up ***   Previous HPI 07/30/21 [name] [name] is a 62 y.o. female here for follow up for positive ANA with erythromelalgia and joint pains.  Since her last visit she had a hospital visit for abdominal pain and swelling laboratory tests were unremarkable and right upper quadrant ultrasound showed likely diffuse hepatic steatosis otherwise normal and with no gallbladder changes.  Her symptoms remain about the same the biggest issue is the severe fatigue which she feels like is pretty debilitating and limiting her ability to work.  She has a lot of stress concerned about losing her job from her symptoms interfering with productivity and missing work. She continues to have episodic discoloration, burning, and swelling symptoms affecting hands and feet.   Previous HPI 07/13/2021 [name] [name] is a 62 y.o. female here for evaluation of positive ANA with erythromelalgia and joint pains. She has a history of polycythemia followed in hematology clinic previously found of iron deficiency anemia and then was on IV replacement.  A lot of her symptoms are ongoing for the past few years but he says been worse for the last 2 to 2-1/2 years in particular. Fatigue is persistent and also notices trouble concentrating and sometime word searching difficulty. She lost one job due to difficulty with these. She noticed physical changes with hair thinning on top of her scalp towards the front also diffuse skin dryness and erythema on arm extensor surfaces and on inside half of legs. Redness coming and going on her face and neck most often in the mornings. Sometimes macules or bumps develop on her face  not particularly painful or itchy.  She maintains some erythematous or violaceous discoloration on some of her toes but also has episodes with increased swelling redness of the entire foot with sweating and burning and stinging pain.  She has had multiple issues with recurrent swelling in her hand with trigger fingers previously had multiple injections for these.   Labs reviewed ANA 1:80 homogenous CCP neg CRP <1 CBC Hgb 17.0 BMP CO2 38 UA wnl   No Rheumatology ROS completed.   PMFS History:  Patient Active Problem List   Diagnosis Date Noted   Positive ANA (antinuclear antibody) 01/17/2022   Erythrocytosis 01/17/2022   Class 2 severe obesity due to excess calories with serious comorbidity and body mass index (BMI) of 36.0 to 36.9 in adult 08/06/2021   Obesity (BMI 30.0-34.9) 09/19/2020   Iron deficiency anemia 09/19/2020   Vitamin B12 deficiency anemia due to intrinsic factor deficiency 09/19/2020   Type II diabetes mellitus with manifestations 09/19/2020   Hyperlipidemia with target LDL less than 130 09/19/2020   Encounter for general adult medical examination with abnormal findings 12/22/2018   Mild episode of recurrent major depressive disorder 02/26/2018   Vitamin D deficiency disease 01/28/2018   GERD with esophagitis 10/29/2017   Routine health maintenance 12/26/2010   Essential hypertension 04/28/2007   SPONDYLOSIS, CERVICAL, WITH RADICULOPATHY 04/28/2007    Past Medical History:  Diagnosis Date  Anemia    Anxiety    Bigeminy    Cognitive changes    Depression    Dysrhythmia    bigeminy   Elevated liver enzymes 2017   undiagnosed and levels recently were more normal per pt   Erythromelalgia ([place])    Hyperlipidemia    Hypertension    IBS (irritable bowel syndrome)    Migraine, unspecified, without mention of intractable migraine without mention of status migrainosus    PONV (postoperative nausea and vomiting)    Tachycardia, unspecified    on BBloc     Family History  Problem Relation Age of Onset   Cancer Mother        breast cancer-left mastectomy   Asthma Mother    Alzheimer's disease Mother    Transient ischemic attack Mother    Other Mother        ITP   Hypertension Father    Hyperlipidemia Father    Diabetes Father    Heart attack Father 79       CABG   Heart failure Father    Pancreatitis Father    Chronic fatigue Sister    Fibromyalgia Sister    Alzheimer's disease Maternal Grandmother    Breast cancer Maternal Grandmother    Past Surgical History:  Procedure Laterality Date   Bilateral myofascial release for recurrent plantar faciitis     BLADDER SUSPENSION N/A 08/09/2015   Procedure: TRANSVAGINAL TAPE (TVT) PROCEDURE;  Surgeon: Bobbye Charleston, MD;  Location: [facility];  Service: Gynecology;  Laterality: N/A;   CARPAL TUNNEL RELEASE     Colonoscopy with polypectomy  03/18/2014   Repeat 2021   CYSTOCELE REPAIR  08/09/2015   Procedure: ANTERIOR REPAIR (CYSTOCELE);  Surgeon: Bobbye Charleston, MD;  Location: [facility];  Service: Gynecology;;   CYSTOSCOPY N/A 08/09/2015   Procedure: Consuela Mimes;  Surgeon: Bobbye Charleston, MD;  Location: [facility];  Service: Gynecology;  Laterality: N/A;   GIVENS CAPSULE STUDY N/A 10/24/2020   Procedure: GIVENS CAPSULE STUDY;  Surgeon: Juanita Craver, MD;  Location: [facility];  Service: Endoscopy;  Laterality: N/A;   ROBOTIC ASSISTED TOTAL HYSTERECTOMY WITH BILATERAL SALPINGO OOPHERECTOMY Bilateral 08/09/2015   Procedure: ROBOTIC ASSISTED TOTAL HYSTERECTOMY WITH BILATERAL SALPINGO OOPHORECTOMY;  Surgeon: Bobbye Charleston, MD;  Location: [facility];  Service: Gynecology;  Laterality: Bilateral;   TRIGGER FINGER RELEASE     TUBAL LIGATION     Social History   Social History Narrative   HSG. 1 year [employer]. Married '86-divorced after 75yrs; remarried  '96. No children, 2 step children. 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 Marriage is stable.       Lives at home with husband.   Right-handed.   One Diet Coke per day, 2 cups coffee per day.   Immunization History  Administered Date(s) Administered   H1N1 02/25/2008   Hep A / Hep B 08/31/2015, 01/29/2016   Hepatitis B, ADULT 11/21/2015   Influenza Whole 12/17/2007, 12/13/2008, 12/15/2009   Influenza,inj,Quad PF,6+ Mos 01/05/2015, 01/29/2016, 12/19/2016, 01/28/2018, 12/22/2018, 11/10/2020   Influenza-Unspecified 12/19/2014, 12/17/2015   Janssen (J&J) SARS-COV-2 Vaccination 06/22/2019   PFIZER(Purple Top)SARS-COV-2 Vaccination 03/09/2020   Td 12/17/2007, 12/13/2008   Tdap 07/08/2019   Zoster Recombinat (Shingrix) 12/22/2018, 04/23/2019     Objective: Vital Signs: LMP  (LMP Unknown)    Physical Exam   Musculoskeletal Exam: ***  CDAI Exam: CDAI Score: -- Patient Global: --; Provider Global: -- Swollen: --; Tender: -- Joint Exam 06/17/2022  No joint exam has been documented for this visit   There is currently no information documented on the homunculus. Go to the Rheumatology activity and complete the homunculus joint exam.  Investigation: No additional findings.  Imaging: Cpap titration  Result Date: 05/28/2022 Larey Seat, MD     06/05/2022  6:44 PM  CPAP TITRATION STUDY AT [facility] PATIENT NAME:  [name] [name]        DATE OF BIRTH:  11-13-1960 PATIENT ID:  [id number]    TYPE OF STUDY:  CPAP READING PHYSICIAN: Larey Seat, MD REFERRING PHYSICIAN:  SCORING TECHNICIAN: Gaylyn Cheers, RPSGT HISTORY: This 62year-old Female patient returns for a full night PAP titration. A HST from 04/23/2022 had revealed an overall AHI of 20.4/h , REM AHI of 6/h and NREM AHI at 23/h. supine sleep was associated with an AHI of 33/h and prone sleep with 3/h. This NREM to REM sleep distribution raised the question of central apnea being present.  The Epworth Sleepiness Scale was endorsed at 5 out of 24 points (scores above or equal to 10 are suggestive of hypersomnolence). FSS  at 56/ 63 points. GDS endorsed at 7/ 15 points. ADDITIONAL INFORMATION:  Height: 66.0 in Weight: 222 lbs (BMI 35.8) Neck Size: 15.0 in  MEDICATIONS: Aspirin, Lipitor, Wellbutrin, eye drops, Cymbalta, Estrace, Lozol, Lopressor, Aleve, Prilosec DESCRIPTION: A RPSGT - sleep technologist was in attendance for the duration of the recording.  Data collection, scoring, video monitoring, and reporting were performed in compliance with the AASM Manual for the Scoring of Sleep and Associated Events; (Hypopnea is scored based on the criteria listed in Section VIII D. 1b in the AASM Manual V2.6 using a 4% oxygen desaturation rule or Hypopnea is scored based on the criteria listed in Section VIII D. 1a in the AASM Manual V2.6 using 3% oxygen desaturation and /or arousal rule).  A physician certified by the American Board of Sleep Medicine reviewed each epoch of the study. The study began as a titration at 5 cm H20 CPAP and used an ESON nasal mask in small size. CPAP reached a final pressure of 8 cm water with an AHI of 0.5/h. SLEEP CONTINUITY AND SLEEP ARCHITECTURE:  Lights off was at 20:53: and lights on 05:02: (8.2 hours in bed). Total sleep time was 374.0 minutes, with a decreased sleep efficiency at 76.5%. Wake after sleep onset (WASO) time accounted for 66 minutes. Sleep latency was increased at 49.0 minutes.  Of the total sleep time, the percentage of stage N1 sleep was 9.5%, stage N2 sleep was 78.3%, stage N3 sleep was 9.8%, and REM sleep was 2.4%.  BODY POSITION: Duration of total sleep and percent of total sleep in their respective position is as follows: supine 94 minutes (25.3%), non-supine 279.5 minutes (74.7%); right 180 minutes (48.1%), left 99 minutes (26.6%), and prone 00 minutes (0.0%). AHI in supine sleep was 0.0/h .  RESPIRATORY MONITORING:  Based on CMS criteria (using a 4% oxygen desaturation rule for scoring hypopneas), there were 0 apneas (0 obstructive; 0 central; 0 mixed), and 14 hypopneas.  Apnea index  was 0.0/h. Hypopnea index was 2.2/h. The apnea-hypopnea index was 2.2/h overall (0.0 supine, 0.0 non-supine; 0.0 REM, 0.0 supine REM). Based on AASM criteria (using a 3% oxygen desaturation and /or arousal rule for scoring hypopneas), there were 0 apneas (0 obstructive; 0 central; 0 mixed), and 18 hypopneas. Apnea index was 0.0. Hypopnea index was 2.9. The apnea-hypopnea index was 2.9 overall (0.0 supine, 0.0 non-supine; 0.0 REM, 0.0  supine REM). OXIMETRY: Total sleep time spent at, or below 88% was 0.4 minutes, or 0.1% of total sleep time. Oxygen desaturations reached a nadir during sleep at 81% from a mean of 94%. LIMB MOVEMENTS: There were 110 periodic limb movements of sleep (17.6/h), of which 27 (4.3/h) were associated with an arousal. AROUSALS: There were 135 arousals in total, for an arousal index of 21.7 arousals/hour.  Of these, 10 were identified as respiratory-related arousals (1.6 /h), 27 were PLM-related arousals (4.3 /h), 108 were non-specific arousals (17.3 /h) and there were additional microarousals. EKG:  The average heart rate during sleep was 76 bpm.  The minimum heart rate during sleep was 68 bpm. The maximum heart rate during recording was 97 bpm in NSR.  Interpretation: the initially heavily fragmented sleep architecture recovered to a more continuous sleep under CPAP and REM sleep rebounded briefly. No central apnea was seen. There were many PLM related arousals present.  RECOMMENDATIONS: The small Eson nasal mask was well tolerated, and a final CPAP pressure of 8 cm water is recommended. The patient spent little time in supine sleep and had no apneas or hypopneas during supine sleep. Autotitration CPAP ResMed device between 5-15 cm water pressure, 2 cm EPR , heated humidification and small size Eson nasal mask or a nasal cradle. The patient may prefer a set up that allows prone sleep. RV with Epworth and FSS scores to be obtained. Larey Seat, MD  [facility] CPAP Summary  General Information Name: [name] [name], [name] [name]  BMI: [id number] Physician: Larey Seat, MD ID: [id number] Height: 66.0 in Technician: Gaylyn Cheers, RPSGT Sex: Female Weight: 222.0 lb Record: [id number] Age: 77 [February 27, 1961] Date: 05/28/2022    Medical & Medication History   [name] [name] today, a right-handed White or Caucasian female with a possible sleep disorder. She has a past medical history of Anemia, Anxiety, Bigeminy, Cognitive changes, Depression, Dysrhythmia, Elevated liver enzymes (2017), Erythromelalgia ([place]), Hyperlipidemia, Hypertension, IBS (irritable bowel syndrome), Migraine, unspecified, without mention of intractable migraine without mention of status migrainosus, PONV (postoperative nausea and vomiting), and Tachycardia, unspecified. Sleep relevant medical history: Nocturia 2-3 times at night/ , DDD cervical spine, disc replacement. Family medical /sleep history: father had cardiac disease, CAD- mother had CVAs. Sleep habits are as follows: The patient's dinner time is between 5.30-6.30PM. The patient goes to bed at 9 PM and continues to sleep for 8 hours, wakes for 2 bathroom breaks, the first time at 12-1 AM. 5.15 AM is the usual rise time. The patient wakes up spontaneously before the alarm. She reports not feeling refreshed or restored in AM, with symptoms such as dry mouth, morning headaches, and residual fatigue.Naps are taken frequently, lasting from 1 to 2 hours and are not more refreshing than nocturnal sleep. ESS Total = 5/ 24 points FSS endorsed at 56/ 63 points. Aspirin, Lipitor, Wellbutrin, eye drops, Cymbalta, Estrace, Lozol, Lopressor, Aleve, Prilosec  Sleep Disorder    Comments  Patient arrived for a CPAP titration polysomnogram. Procedure explained and all questions answered. Patient was shown CPAP at 5cm/H20 using a small Eson 2 nasal mask and a small Vitera full face mask prior to setup. Standard paste setup without complications. CPAP was started at 5cm  using the small Eson 2 nasal mask. Patient slept left, right, and supine. No significant snoring was heard. Respiratory events observed. CPAP was increased to 8 cm in an effort to control obstructive respiratory events and abolish snoring. No obvious cardiac arrhythmias observed. No  significant PLMS observed. One restroom visit.  CPAP start time: 08:53:28 PM CPAP end time: 05:02:17 AM Time Total Supine Side Prone Upright Recording (TRT) 8h 9.9m 2h 53.68m 5h 15.58m 0h 0.37m 0h 0.82m Sleep (TST) 6h 14.50m 1h 34.71m 4h 39.2m 0h 0.54m 0h 0.40m Latency N1 N2 N3 REM Onset Per. Slp. Eff. Actual 0h 49.28m 0h 52.29m 1h 54.37m 5h 43.59m 0h 49.79m 0h 50.22m 76.48% Stg Dur Wake N1 N2 N3 REM Total 115.0 35.5 293.0 36.5 9.0 Supine 79.0 3.5 65.5 25.5 0.0 Side 36.0 32.0 227.5 11.0 9.0 Prone 0.0 0.0 0.0 0.0 0.0 Upright 0.0 0.0 0.0 0.0 0.0  Stg % Wake N1 N2 N3 REM Total 23.5 9.5 78.3 9.8 2.4 Supine 16.2 0.9 17.5 6.8 0.0 Side 7.4 8.6 60.8 2.9 2.4 Prone 0.0 0.0 0.0 0.0 0.0 Upright 0.0 0.0 0.0 0.0 0.0  Apnea Summary Sub Supine Side Prone Upright Total 0 Total 0 0 0 0 0   REM 0 0 0 0 0   NREM 0 0 0 0 0 Obs 0 REM 0 0 0 0 0   NREM 0 0 0 0 0 Mix 0 REM 0 0 0 0 0   NREM 0 0 0 0 0 Cen 0 REM 0 0 0 0 0   NREM 0 0 0 0 0 Rera Summary Sub Supine Side Prone Upright Total 0 Total 0 0 0 0 0   REM 0 0 0 0 0   NREM 0 0 0 0 0  Hypopnea Summary Sub Supine Side Prone Upright Total 18 Total 18 0 18 0 0   REM 0 0 0 0 0   NREM 18 0 18 0 0 4% Hypopnea Summary Sub Supine Side Prone Upright Total (4%) 14 Total 14 0 14 0 0   REM 0 0 0 0 0   NREM 14 0 14 0 0  AHI Total Obs Mix Cen 2.89 Apnea 0.00 0.00 0.00 0.00  Hypopnea 2.89 -- -- -- 2.25 Hypopnea (4%) 2.25 -- -- --  Total Supine Side Prone Upright Position AHI 2.89 0.00 3.86 0.00 0.00 REM AHI 0.00  NREM AHI 2.96  Position RDI 2.89 0.00 3.86 0.00 0.00 REM RDI 0.00  NREM RDI 2.96  4% Hypopnea Total Supine Side Prone Upright Position AHI (4%) 2.25 0.00 3.01 0.00 0.00 REM AHI (4%) 0.00  NREM AHI (4%) 2.30  Position RDI (4%) 2.25  0.00 3.01 0.00 0.00 REM RDI (4%) 0.00  NREM RDI (4%) 2.30  Desaturation Information  <100% <90% <80% <70% <60% <50% <40% Supine 4 0 0 0 0 0 0 Side 29 3 0 0 0 0 0 Prone 0 0 0 0 0 0 0 Upright 0 0 0 0 0 0 0 Total 33 3 0 0 0 0 0 Desaturation threshold setting: 4% Minimum desaturation setting: 10 seconds SaO2 nadir: 78% The longest event was a 22 sec obstructive Hypopnea with a minimum SaO2 of 88%. The lowest SaO2 was 87% associated with a 13 sec obstructive Hypopnea. EKG Rates EKG Avg Max Min Awake 78 97 65 Asleep 76 91 68 EKG Events: none Awakening/Arousal Information # of Awakenings 31 Wake after sleep onset 66.78m Wake after persistent sleep 65.75m Arousal Assoc. Arousals Index Apneas 0 0.0 Hypopneas 10 1.6 Leg Movements 36 5.8 Snore 0.0 0.0 PTT Arousals 0 0.0 Spontaneous 108 17.3 Total 154 24.7 Myoclonus Information PLMS LMs Index Total LMs during PLMS 110 17.6 LMs w/ Microarousals 27 4.3 LM LMs Index w/ Microarousal 9 1.4 w/ Awakening  4 0.6 w/ Resp Event 0 0.0 Spontaneous 25 4.0 Total 34 5.5     Recent Labs: Lab Results  Component Value Date   WBC 7.2 05/15/2022   HGB 17.0 (H) 05/15/2022   PLT 285 05/15/2022   NA 141 05/15/2022   K 4.0 05/15/2022   CL 101 05/15/2022   CO2 34 (H) 05/15/2022   GLUCOSE 97 05/15/2022   BUN 13 05/15/2022   CREATININE 0.81 05/15/2022   BILITOT 0.6 05/15/2022   ALKPHOS 58 05/15/2022   AST 21 05/15/2022   ALT 28 05/15/2022   PROT 6.5 05/15/2022   ALBUMIN 4.2 05/15/2022   CALCIUM 9.8 05/15/2022   GFRAA >60 01/30/2016    Speciality Comments: No specialty comments available.  Procedures:  No procedures performed Allergies: Enalapril, Codeine, and Macrobid [nitrofurantoin]   Assessment / Plan:     Visit Diagnoses: No diagnosis found.  ***  Orders: No orders of the defined types were placed in this encounter.  No orders of the defined types were placed in this encounter.    Follow-Up Instructions: No follow-ups on file.   Collier Salina,  MD  Note - This record has been created using [employer].  Chart creation errors have been sought, but may not always  have been located. Such creation errors do not reflect on  the standard of medical care.

## 2022-06-23 NOTE — Progress Notes (Signed)
Office Visit Note  Patient: [name] [name]             Date of Birth: Jul 28, 1960           MRN: [id number]             PCP: Etta Grandchild, MD Referring: Etta Grandchild, MD Visit Date: 06/24/2022   Subjective:  Follow-up (Patient states her feet are turning dark purple. Patient states she has lesions or irregular marks all over her body.)   History of Present Illness: [name] [name] is a 62 y.o. female here for follow up for monitoring with symptoms of erythromelalgia or atypical Raynaud's, joint pains and a positive ANA.  She also has recent evaluation with hematology with polycythemia findings and thought possibly related to undertreated OSA.  She still getting intermittent discoloration and pretty frequently having violaceous color change in her feet.  Previous HPI 07/30/21 [name] [name] is a 62 y.o. female here for follow up for positive ANA with erythromelalgia and joint pains.  Since her last visit she had a hospital visit for abdominal pain and swelling laboratory tests were unremarkable and right upper quadrant ultrasound showed likely diffuse hepatic steatosis otherwise normal and with no gallbladder changes.  Her symptoms remain about the same the biggest issue is the severe fatigue which she feels like is pretty debilitating and limiting her ability to work.  She has a lot of stress concerned about losing her job from her symptoms interfering with productivity and missing work. She continues to have episodic discoloration, burning, and swelling symptoms affecting hands and feet.   Previous HPI 07/13/2021 [name] is a 62 y.o. female here for evaluation of positive ANA with erythromelalgia and joint pains. She has a history of polycythemia followed in hematology clinic previously found of iron deficiency anemia and then was on IV replacement.  A lot of her symptoms are ongoing for the past few years but he says been worse for the last 2 to 2-1/2 years in particular. Fatigue  is persistent and also notices trouble concentrating and sometime word searching difficulty. She lost one job due to difficulty with these. She noticed physical changes with hair thinning on top of her scalp towards the front also diffuse skin dryness and erythema on arm extensor surfaces and on inside half of legs. Redness coming and going on her face and neck most often in the mornings. Sometimes macules or bumps develop on her face not particularly painful or itchy.  She maintains some erythematous or violaceous discoloration on some of her toes but also has episodes with increased swelling redness of the entire foot with sweating and burning and stinging pain.  She has had multiple issues with recurrent swelling in her hand with trigger fingers previously had multiple injections for these.   Labs reviewed ANA 1:80 homogenous CCP neg CRP <1 CBC Hgb 17.0 BMP CO2 38 UA wnl   Review of Systems  Constitutional:  Positive for fatigue.  HENT:  Positive for mouth dryness. Negative for mouth sores.   Eyes:  Positive for dryness.  Respiratory:  Positive for shortness of breath.   Cardiovascular:  Positive for chest pain and palpitations.  Gastrointestinal:  Positive for constipation. Negative for blood in stool and diarrhea.  Endocrine: Negative for increased urination.  Genitourinary:  Negative for involuntary urination.  Musculoskeletal:  Positive for joint pain, gait problem, joint pain, joint swelling, myalgias, muscle weakness, morning stiffness, muscle tenderness and myalgias.  Skin:  Positive for color change, rash, hair loss and sensitivity to sunlight.  Allergic/Immunologic: Negative for susceptible to infections.  Neurological:  Positive for dizziness. Negative for headaches.  Hematological:  Negative for swollen glands.  Psychiatric/Behavioral:  Negative for depressed mood and sleep disturbance. The patient is not nervous/anxious.     PMFS History:  Patient Active Problem List    Diagnosis Date Noted   Rash and other nonspecific skin eruption 06/24/2022   Positive ANA (antinuclear antibody) 01/17/2022   Erythrocytosis 01/17/2022   Class 2 severe obesity due to excess calories with serious comorbidity and body mass index (BMI) of 36.0 to 36.9 in adult (HCC) 08/06/2021   Obesity (BMI 30.0-34.9) 09/19/2020   Iron deficiency anemia 09/19/2020   Vitamin B12 deficiency anemia due to intrinsic factor deficiency 09/19/2020   Type II diabetes mellitus with manifestations (HCC) 09/19/2020   Hyperlipidemia with target LDL less than 130 09/19/2020   Encounter for general adult medical examination with abnormal findings 12/22/2018   Mild episode of recurrent major depressive disorder (HCC) 02/26/2018   Vitamin D deficiency disease 01/28/2018   GERD with esophagitis 10/29/2017   Routine health maintenance 12/26/2010   Essential hypertension 04/28/2007   SPONDYLOSIS, CERVICAL, WITH RADICULOPATHY 04/28/2007    Past Medical History:  Diagnosis Date   Anemia    Anxiety    Bigeminy    Cognitive changes    Depression    Dysrhythmia    bigeminy   Elevated liver enzymes 2017   undiagnosed and levels recently were more normal per pt   Erythromelalgia (HCC)    Hyperlipidemia    Hypertension    IBS (irritable bowel syndrome)    Migraine, unspecified, without mention of intractable migraine without mention of status migrainosus    PONV (postoperative nausea and vomiting)    Secondary polycythemia    Tachycardia, unspecified    on BBloc    Family History  Problem Relation Age of Onset   Cancer Mother        breast cancer-left mastectomy   Asthma Mother    Alzheimer's disease Mother    Transient ischemic attack Mother    Other Mother        ITP   Hypertension Father    Hyperlipidemia Father    Diabetes Father    Heart attack Father 78       CABG   Heart failure Father    Pancreatitis Father    Chronic fatigue Sister    Fibromyalgia Sister    Alzheimer's disease  Maternal Grandmother    Breast cancer Maternal Grandmother    Past Surgical History:  Procedure Laterality Date   Bilateral myofascial release for recurrent plantar faciitis     BLADDER SUSPENSION N/A 08/09/2015   Procedure: TRANSVAGINAL TAPE (TVT) PROCEDURE;  Surgeon: Carrington Clamp, MD;  Location: [facility];  Service: Gynecology;  Laterality: N/A;   CARPAL TUNNEL RELEASE     Colonoscopy with polypectomy  03/18/2014   Repeat 2021   CYSTOCELE REPAIR  08/09/2015   Procedure: ANTERIOR REPAIR (CYSTOCELE);  Surgeon: Carrington Clamp, MD;  Location: [facility];  Service: Gynecology;;   CYSTOSCOPY N/A 08/09/2015   Procedure: Bluford Kaufmann;  Surgeon: Carrington Clamp, MD;  Location: [facility];  Service: Gynecology;  Laterality: N/A;   GIVENS CAPSULE STUDY N/A 10/24/2020   Procedure: GIVENS CAPSULE STUDY;  Surgeon: Charna Elizabeth, MD;  Location: [facility];  Service: Endoscopy;  Laterality: N/A;   ROBOTIC ASSISTED TOTAL HYSTERECTOMY WITH BILATERAL SALPINGO OOPHERECTOMY Bilateral 08/09/2015   Procedure: ROBOTIC ASSISTED  TOTAL HYSTERECTOMY WITH BILATERAL SALPINGO OOPHORECTOMY;  Surgeon: Carrington Clamp, MD;  Location: [facility];  Service: Gynecology;  Laterality: Bilateral;   TRIGGER FINGER RELEASE     TUBAL LIGATION     Social History   Social History Narrative   HSG. 1 year [employer] [place]. Married '86-divorced after 17yrs; remarried  '96. No children, 2 step children. 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 Marriage is stable.      Lives at home with husband.   Right-handed.   One Diet Coke per day, 2 cups coffee per day.   Immunization History  Administered Date(s) Administered   H1N1 02/25/2008   Hep A / Hep B 08/31/2015, 01/29/2016   Hepatitis B, ADULT 11/21/2015   Influenza Whole 12/17/2007, 12/13/2008, 12/15/2009   Influenza,inj,Quad PF,6+ Mos 01/05/2015, 01/29/2016, 12/19/2016, 01/28/2018, 12/22/2018, 11/10/2020   Influenza-Unspecified 12/19/2014,  12/17/2015   Janssen (J&J) SARS-COV-2 Vaccination 06/22/2019   PFIZER(Purple Top)SARS-COV-2 Vaccination 03/09/2020   Td 12/17/2007, 12/13/2008   Tdap 07/08/2019   Zoster Recombinat (Shingrix) 12/22/2018, 04/23/2019     Objective: Vital Signs: BP 118/81 (BP Location: Left Arm, Patient Position: Sitting, Cuff Size: Normal)   Pulse 66   Resp 12   Ht 5\' 6"  (1.676 m)   Wt 203 lb (92.1 kg)   LMP  (LMP Unknown)   BMI 32.77 kg/m    Physical Exam Eyes:     Conjunctiva/sclera: Conjunctivae normal.  Cardiovascular:     Rate and Rhythm: Normal rate and regular rhythm.  Pulmonary:     Effort: Pulmonary effort is normal.     Breath sounds: Normal breath sounds.  Lymphadenopathy:     Cervical: No cervical adenopathy.  Skin:    General: Skin is warm and dry.     Comments: Some violaceous discoloration of toes no abnormal temperature change, capillary refill is grossly normal, no digital pitting no peeling or ulcers  Neurological:     Mental Status: She is alert.  Psychiatric:        Mood and Affect: Mood normal.      Musculoskeletal Exam:  Neck full ROM no tenderness Shoulders full ROM no tenderness or swelling Elbows full ROM no tenderness or swelling Wrists full ROM no tenderness or swelling Fingers Heberden's nodes present right third digit flexion range of motion limited, no palpable synovitis Knees full ROM no tenderness or swelling Ankles full ROM no tenderness or swelling MTPs full ROM no tenderness or swelling   Investigation: No additional findings.  Imaging: No results found.  Recent Labs: Lab Results  Component Value Date   WBC 7.2 05/15/2022   HGB 17.0 (H) 05/15/2022   PLT 285 05/15/2022   NA 141 05/15/2022   K 4.0 05/15/2022   CL 101 05/15/2022   CO2 34 (H) 05/15/2022   GLUCOSE 97 05/15/2022   BUN 13 05/15/2022   CREATININE 0.81 05/15/2022   BILITOT 0.6 05/15/2022   ALKPHOS 58 05/15/2022   AST 21 05/15/2022   ALT 28 05/15/2022   PROT 6.5 05/15/2022    ALBUMIN 4.2 05/15/2022   CALCIUM 9.8 05/15/2022   GFRAA >60 01/30/2016    Speciality Comments: No specialty comments available.  Procedures:  No procedures performed Allergies: Enalapril, Codeine, and Macrobid [nitrofurantoin]   Assessment / Plan:     Visit Diagnoses: Positive ANA (antinuclear antibody)  No peripheral joint synovitis or specific clinical criteria noted at this time.  Can continue to monitor if there are new or progressive symptoms  but I think less likely indicative of a real underlying autoimmune connective tissue disease.  Erythrocytosis Rash and other nonspecific skin eruption  The skin discoloration atypical Raynaud's versus erythromelalgia could be secondary to the polycythemia.  Discussed possibility of adding oral calcium channel blocker when experiencing possible Raynaud's phenomenon versus we can just monitor this for now.   Orders: No orders of the defined types were placed in this encounter.  No orders of the defined types were placed in this encounter.    Follow-Up Instructions: Return if symptoms worsen or fail to improve, for Raynauds, arthralgias.   Fuller Plan, MD  Note - This record has been created using [employer].  Chart creation errors have been sought, but may not always  have been located. Such creation errors do not reflect on  the standard of medical care.

## 2022-06-24 ENCOUNTER — Encounter: Payer: Self-pay | Admitting: Internal Medicine

## 2022-06-24 ENCOUNTER — Ambulatory Visit: Payer: BC Managed Care – PPO | Attending: Internal Medicine | Admitting: Internal Medicine

## 2022-06-24 VITALS — BP 118/81 | HR 66 | Resp 12 | Ht 66.0 in | Wt 203.0 lb

## 2022-06-24 DIAGNOSIS — D751 Secondary polycythemia: Secondary | ICD-10-CM

## 2022-06-24 DIAGNOSIS — R21 Rash and other nonspecific skin eruption: Secondary | ICD-10-CM | POA: Insufficient documentation

## 2022-06-24 DIAGNOSIS — R768 Other specified abnormal immunological findings in serum: Secondary | ICD-10-CM

## 2022-06-26 NOTE — Telephone Encounter (Signed)
The patient had not reached back out to [place] advising that she wanted to move forward with the treatment. I will send the orders to [facility] today.

## 2022-06-26 NOTE — Telephone Encounter (Signed)
Patient left a voicemail on my phone stating she has not received a call yet to get set up with a CPAP machine and just wanted to follow up with it.

## 2022-07-03 ENCOUNTER — Other Ambulatory Visit: Payer: Self-pay | Admitting: Internal Medicine

## 2022-07-03 DIAGNOSIS — F330 Major depressive disorder, recurrent, mild: Secondary | ICD-10-CM

## 2022-07-10 DIAGNOSIS — G4733 Obstructive sleep apnea (adult) (pediatric): Secondary | ICD-10-CM | POA: Diagnosis not present

## 2022-07-10 LAB — HM MAMMOGRAPHY

## 2022-07-18 DIAGNOSIS — Z01419 Encounter for gynecological examination (general) (routine) without abnormal findings: Secondary | ICD-10-CM | POA: Diagnosis not present

## 2022-07-18 DIAGNOSIS — Z1231 Encounter for screening mammogram for malignant neoplasm of breast: Secondary | ICD-10-CM | POA: Diagnosis not present

## 2022-07-30 ENCOUNTER — Other Ambulatory Visit: Payer: Self-pay | Admitting: Internal Medicine

## 2022-07-30 DIAGNOSIS — E785 Hyperlipidemia, unspecified: Secondary | ICD-10-CM

## 2022-07-31 ENCOUNTER — Other Ambulatory Visit: Payer: Self-pay | Admitting: Internal Medicine

## 2022-07-31 DIAGNOSIS — I10 Essential (primary) hypertension: Secondary | ICD-10-CM

## 2022-08-09 DIAGNOSIS — G4733 Obstructive sleep apnea (adult) (pediatric): Secondary | ICD-10-CM | POA: Diagnosis not present

## 2022-08-27 ENCOUNTER — Other Ambulatory Visit: Payer: Self-pay | Admitting: Internal Medicine

## 2022-08-27 DIAGNOSIS — I10 Essential (primary) hypertension: Secondary | ICD-10-CM

## 2022-08-30 ENCOUNTER — Telehealth: Payer: Self-pay | Admitting: Internal Medicine

## 2022-08-30 ENCOUNTER — Other Ambulatory Visit: Payer: Self-pay | Admitting: Internal Medicine

## 2022-08-30 DIAGNOSIS — E669 Obesity, unspecified: Secondary | ICD-10-CM

## 2022-08-30 MED ORDER — SEMAGLUTIDE-WEIGHT MANAGEMENT 0.5 MG/0.5ML ~~LOC~~ SOAJ
0.5000 mg | SUBCUTANEOUS | 0 refills | Status: AC
Start: 2022-09-28 — End: 2022-10-26

## 2022-08-30 MED ORDER — SEMAGLUTIDE-WEIGHT MANAGEMENT 1.7 MG/0.75ML ~~LOC~~ SOAJ
1.7000 mg | SUBCUTANEOUS | 0 refills | Status: AC
Start: 2022-11-25 — End: 2022-12-23

## 2022-08-30 MED ORDER — SEMAGLUTIDE-WEIGHT MANAGEMENT 0.25 MG/0.5ML ~~LOC~~ SOAJ
0.2500 mg | SUBCUTANEOUS | 0 refills | Status: AC
Start: 2022-08-30 — End: 2022-09-27

## 2022-08-30 MED ORDER — SEMAGLUTIDE-WEIGHT MANAGEMENT 2.4 MG/0.75ML ~~LOC~~ SOAJ
2.4000 mg | SUBCUTANEOUS | 0 refills | Status: DC
Start: 2022-12-24 — End: 2022-12-24

## 2022-08-30 MED ORDER — SEMAGLUTIDE-WEIGHT MANAGEMENT 1 MG/0.5ML ~~LOC~~ SOAJ
1.0000 mg | SUBCUTANEOUS | 0 refills | Status: AC
Start: 2022-10-27 — End: 2022-11-24

## 2022-08-30 NOTE — Telephone Encounter (Signed)
Pt has been informed that Rx was ready for pick up at the front desk.

## 2022-08-30 NOTE — Telephone Encounter (Signed)
Patient called and wants a copy of the prescription where you prescribed her wygovy.  She has found somewhere to purchase it but does not want it sent to her pharmacy.  She would like to pick up a  copy of the rx where it was originally prescribed.  Please call patient when this is ready.  Patient's number:  [phone]

## 2022-09-09 DIAGNOSIS — G4733 Obstructive sleep apnea (adult) (pediatric): Secondary | ICD-10-CM | POA: Diagnosis not present

## 2022-09-17 ENCOUNTER — Ambulatory Visit (INDEPENDENT_AMBULATORY_CARE_PROVIDER_SITE_OTHER): Payer: BC Managed Care – PPO | Admitting: Neurology

## 2022-09-17 ENCOUNTER — Encounter: Payer: Self-pay | Admitting: Neurology

## 2022-09-17 VITALS — BP 112/73 | HR 74 | Ht 66.0 in | Wt 190.0 lb

## 2022-09-17 DIAGNOSIS — R5381 Other malaise: Secondary | ICD-10-CM | POA: Insufficient documentation

## 2022-09-17 DIAGNOSIS — G4733 Obstructive sleep apnea (adult) (pediatric): Secondary | ICD-10-CM | POA: Diagnosis not present

## 2022-09-17 DIAGNOSIS — R5382 Chronic fatigue, unspecified: Secondary | ICD-10-CM

## 2022-09-17 DIAGNOSIS — Z789 Other specified health status: Secondary | ICD-10-CM | POA: Insufficient documentation

## 2022-09-17 NOTE — Progress Notes (Signed)
Provider:  Melvyn Novas, MD  Primary Care Physician:  Etta Grandchild, MD [address] [place] [address]     Referring Provider: Etta Grandchild, Md [address] [place],  [place] [address]          Chief Complaint according to patient   Patient presents with:     New Patient (Initial Visit)           HISTORY OF PRESENT ILLNESS:  [name] [name] is a 62 y.o. female patient who is here for revisit 09/17/2022 for CPAP compliance.  She stated she was not told to bring he machine and supplies to this first visit.  ' Chief concern according to patient : " the mask is too tight." She remains using an ESON nasal mask.  The patient had undergone a sleep study HST 04-23-2022, AHI of 20/h and REM AHI of 6/h  NREM AHI of 23/h .  She returned for in lab CPAP titration,  to 8 cm water under an eson nasal mask. The study had been ordered under the following diagnosis codes CPAP titration for chronic fatigue and nonrestorative sleep snoring, a positive ANA, erythrocytosis and polycythemia and presumed central sleep apnea.  The patient has done well with her daily use by 70% but she has not managed to advance to 4 hours or more of daily use.  So the average currently is 1 hours 34 minutes the minimum pressure is 5 maximum pressure 15 cmH2O and 2 cm expiratory pressure relief were also set with a residual AHI of 0.2/h.  There is definitely a significant reduction in the apnea and hypopnea index and no central apneas have arisen.  The 95th percentile pressure here is 7.2 cm water which is below I do not see a large air leak at all 3.8 L a minute is a little fairly so maybe the mask does not have to be quite as tight as the patient the mattress.   Respiratory rate and tidal volume , minute ventilation are pretty identical from night to night in the close range.      First consultation visit , referral by PCP on 01/17/2022 .  Chief concern according to patient :  " my doctors  want me to get checked for sleep apnea"    I have the pleasure of seeing [name] [name] today, a right-handed White or Caucasian female with a possible sleep disorder.  She   has a past medical history of Anemia, Anxiety, Bigeminy, Cognitive changes, Depression, Dysrhythmia, Elevated liver enzymes (2017), Erythromelalgia (HCC), Hyperlipidemia, Hypertension, IBS (irritable bowel syndrome), Migraine, unspecified, without mention of intractable migraine without mention of status migrainosus, PONV (postoperative nausea and vomiting), and Tachycardia, unspecified.   Sleep relevant medical history: Nocturia 2-3 times at night/ , DDD cervical spine, disc replacement.    Family medical /sleep history: father had cardiac disease, CAD- mother had CVAs.    Social history:  Patient is working as Company secretary for an [employer] and lives in a household with spouse, grown children  live on there own- no pets.   Tobacco use; never. ETOH use : 3-4 drinks a month,  Caffeine intake in form of Coffee( 1 mug in AM ) Soda(  12 ounces a day) Tea ( /) or energy drinks. Regular exercise: none , she has low exercise tolerance and naps after gardening, .         Sleep habits are as  follows: The patient's dinner time is between 5.30-6.30PM. The patient goes to bed at 9 PM and continues to sleep for 8 hours, wakes for 2 bathroom breaks, the first time at 12-1  AM.   The preferred sleep position is laterally, with the support of 2 pillows.  Dreams are reportedly frequent/vivid- often nightmares .  5.15  AM is the usual rise time.  The patient wakes up spontaneously before the alarm.   She reports not feeling refreshed or restored in AM, with symptoms such as dry mouth, morning headaches, and residual fatigue.  Naps are taken frequently, lasting from 1 to 2 hours  and are not more refreshing than nocturnal sleep.     Review of Systems: Out of a complete 14 system review, the patient complains of only the  following symptoms, and all other reviewed systems are negative.:     How likely are you to doze in the following situations: 0 = not likely, 1 = slight chance, 2 = moderate chance, 3 = high chance   Sitting and Reading? Watching Television? Sitting inactive in a public place (theater or meeting)? As a passenger in a car for an hour without a break? Lying down in the afternoon when circumstances permit? Sitting and talking to someone? Sitting quietly after lunch without alcohol? In a car, while stopped for a few minutes in traffic?   Total = 8/ 24 points   FSS endorsed at 46/ 63 points.   Social History   Socioeconomic History   Marital status: Married    Spouse name: Not on file   Number of children: 2   Years of education: 12   Highest education level: High school graduate  Occupational History   Occupation: customer service/sales  Tobacco Use   Smoking status: Never   Smokeless tobacco: Never  Vaping Use   Vaping Use: Never used  Substance and Sexual Activity   Alcohol use: Yes    Comment: occasional wine   Drug use: No   Sexual activity: Yes    Partners: Male  Other Topics Concern   Not on file  Social History Narrative   HSG. 1 year [employer] [place]. Married '86-divorced after 37yrs; remarried  '96. No children, 2 step children. 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 Marriage is stable.      Lives at home with husband.   Right-handed.   One Diet Coke per day, 2 cups coffee per day.   Social Determinants of Health   Financial Resource Strain: Not on file  Food Insecurity: Not on file  Transportation Needs: Not on file  Physical Activity: Not on file  Stress: Not on file  Social Connections: Not on file    Family History  Problem Relation Age of Onset   Cancer Mother        breast cancer-left mastectomy   Asthma Mother    Alzheimer's disease Mother    Transient ischemic attack Mother    Other Mother         ITP   Hypertension Father    Hyperlipidemia Father    Diabetes Father    Heart attack Father 61       CABG   Heart failure Father    Pancreatitis Father    Chronic fatigue Sister    Fibromyalgia Sister    Alzheimer's disease Maternal Grandmother    Breast cancer Maternal Grandmother     Past Medical History:  Diagnosis Date  Anemia    Anxiety    Bigeminy    Cognitive changes    Depression    Dysrhythmia    bigeminy   Elevated liver enzymes 2017   undiagnosed and levels recently were more normal per pt   Erythromelalgia (HCC)    Hyperlipidemia    Hypertension    IBS (irritable bowel syndrome)    Migraine, unspecified, without mention of intractable migraine without mention of status migrainosus    PONV (postoperative nausea and vomiting)    Secondary polycythemia    Tachycardia, unspecified    on BBloc    Past Surgical History:  Procedure Laterality Date   Bilateral myofascial release for recurrent plantar faciitis     BLADDER SUSPENSION N/A 08/09/2015   Procedure: TRANSVAGINAL TAPE (TVT) PROCEDURE;  Surgeon: Carrington Clamp, MD;  Location: [facility];  Service: Gynecology;  Laterality: N/A;   CARPAL TUNNEL RELEASE     Colonoscopy with polypectomy  03/18/2014   Repeat 2021   CYSTOCELE REPAIR  08/09/2015   Procedure: ANTERIOR REPAIR (CYSTOCELE);  Surgeon: Carrington Clamp, MD;  Location: [facility];  Service: Gynecology;;   CYSTOSCOPY N/A 08/09/2015   Procedure: Bluford Kaufmann;  Surgeon: Carrington Clamp, MD;  Location: [facility];  Service: Gynecology;  Laterality: N/A;   GIVENS CAPSULE STUDY N/A 10/24/2020   Procedure: GIVENS CAPSULE STUDY;  Surgeon: Charna Elizabeth, MD;  Location: [facility]COPY;  Service: Endoscopy;  Laterality: N/A;   ROBOTIC ASSISTED TOTAL HYSTERECTOMY WITH BILATERAL SALPINGO OOPHERECTOMY Bilateral 08/09/2015   Procedure: ROBOTIC ASSISTED TOTAL HYSTERECTOMY WITH BILATERAL SALPINGO OOPHORECTOMY;  Surgeon: Carrington Clamp, MD;  Location: [facility];  Service:  Gynecology;  Laterality: Bilateral;   TRIGGER FINGER RELEASE     TUBAL LIGATION       Current Outpatient Medications on File Prior to Visit  Medication Sig Dispense Refill   aspirin EC 81 MG tablet Take 81 mg by mouth daily. Swallow whole.     atorvastatin (LIPITOR) 10 MG tablet TAKE 1 TABLET BY MOUTH EVERY DAY 90 tablet 0   buPROPion (WELLBUTRIN XL) 150 MG 24 hr tablet TAKE 1 TABLET BY MOUTH EVERY DAY 90 tablet 1   Carboxymethylcellulose Sodium (EYE DROPS OP) Place 1 drop into both eyes daily. replens     DULoxetine (CYMBALTA) 60 MG capsule TAKE 1 CAPSULE BY MOUTH EVERY DAY 90 capsule 1   estradiol (ESTRACE) 1 MG tablet Take 1 mg by mouth daily.     indapamide (LOZOL) 1.25 MG tablet TAKE 1 TABLET BY MOUTH EVERY DAY 90 tablet 0   metoprolol tartrate (LOPRESSOR) 25 MG tablet TAKE 1 TABLET(25 MG) BY MOUTH TWICE DAILY 180 tablet 0   Naproxen Sodium (ALEVE PO) Take 2 tablets by mouth every 12 (twelve) hours.     omeprazole (PRILOSEC) 20 MG capsule Take 20 mg by mouth daily.     Semaglutide-Weight Management 0.25 MG/0.5ML SOAJ Inject 0.25 mg into the skin once a week for 28 days. 2 mL 0   [START ON 09/28/2022] Semaglutide-Weight Management 0.5 MG/0.5ML SOAJ Inject 0.5 mg into the skin once a week for 28 days. 2 mL 0   [START ON 10/27/2022] Semaglutide-Weight Management 1 MG/0.5ML SOAJ Inject 1 mg into the skin once a week for 28 days. 2 mL 0   [START ON 11/25/2022] Semaglutide-Weight Management 1.7 MG/0.75ML SOAJ Inject 1.7 mg into the skin once a week for 28 days. 3 mL 0   [START ON 12/24/2022] Semaglutide-Weight Management 2.4 MG/0.75ML SOAJ Inject 2.4 mg into the skin once a week for  28 days. 3 mL 0   No current facility-administered medications on file prior to visit.    Allergies  Allergen Reactions   Enalapril Cough   Codeine Nausea And Vomiting    REACTION: causes nausea and vomiting   Macrobid [Nitrofurantoin] Other (See Comments)    Gets elevated liver enzymes, low potassium, headache,  nausea and generalized body aches shortly after taking meds.  This is the second time this had occurred.     DIAGNOSTIC DATA (LABS, IMAGING, TESTING) - I reviewed patient records, labs, notes, testing and imaging myself where available.  Lab Results  Component Value Date   WBC 7.2 05/15/2022   HGB 17.0 (H) 05/15/2022   HCT 49.8 (H) 05/15/2022   MCV 86.5 05/15/2022   PLT 285 05/15/2022      Component Value Date/Time   NA 141 05/15/2022 0932   NA 139 11/23/2014 0000   NA 135 (L) 04/20/2012 1819   K 4.0 05/15/2022 0932   K 3.7 04/20/2012 1819   CL 101 05/15/2022 0932   CL 100 04/20/2012 1819   CO2 34 (H) 05/15/2022 0932   CO2 29 04/20/2012 1819   GLUCOSE 97 05/15/2022 0932   GLUCOSE 108 (H) 04/20/2012 1819   BUN 13 05/15/2022 0932   BUN 15 11/23/2014 0000   BUN 11 04/20/2012 1819   CREATININE 0.81 05/15/2022 0932   CREATININE 0.63 04/20/2012 1819   CALCIUM 9.8 05/15/2022 0932   CALCIUM 9.4 04/20/2012 1819   PROT 6.5 05/15/2022 0932   PROT 8.2 04/20/2012 1819   ALBUMIN 4.2 05/15/2022 0932   ALBUMIN 4.1 04/20/2012 1819   AST 21 05/15/2022 0932   ALT 28 05/15/2022 0932   ALT 31 04/20/2012 1819   ALKPHOS 58 05/15/2022 0932   ALKPHOS 69 04/20/2012 1819   BILITOT 0.6 05/15/2022 0932   GFRNONAA >60 05/15/2022 0932   GFRNONAA >60 04/20/2012 1819   GFRAA >60 01/30/2016 1125   GFRAA >60 04/20/2012 1819   Lab Results  Component Value Date   CHOL 155 08/06/2021   HDL 41.10 08/06/2021   LDLCALC 87 08/06/2021   TRIG 135.0 08/06/2021   CHOLHDL 4 08/06/2021   Lab Results  Component Value Date   HGBA1C 5.2 06/05/2022   Lab Results  Component Value Date   VITAMINB12 991 (H) 06/05/2022   Lab Results  Component Value Date   TSH 2.37 06/05/2022    PHYSICAL EXAM:  Today's Vitals   09/17/22 1501  BP: 112/73  Pulse: 74  Weight: 190 lb (86.2 kg)  Height: 5\' 6"  (1.676 m)   Body mass index is 30.67 kg/m.   Wt Readings from Last 3 Encounters:  09/17/22 190 lb (86.2  kg)  06/24/22 203 lb (92.1 kg)  06/05/22 206 lb (93.4 kg)     Ht Readings from Last 3 Encounters:  09/17/22 5\' 6"  (1.676 m)  06/24/22 5\' 6"  (1.676 m)  06/05/22 5\' 6"  (1.676 m)      General: TThe patient is awake, alert and appears not in acute distress. The patient is well groomed. Head: Normocephalic, atraumatic. Neck is supple. Mallampati 2-3,  neck circumference:15 inches . Nasal airflow  patent.  Retrognathia is not seen.  Dental status: crowded lower jaw Cardiovascular:  Regular rate and cardiac rhythm by pulse,  without distended neck veins. Respiratory: Lungs are clear to auscultation.  Skin:  Without evidence of ankle edema, or rash. Trunk: The patient's posture is erect.   Neurologic exam : The patient is awake and alert, oriented  to place and time.   Memory subjective described as intact.  Attention span & concentration ability appears normal.  Speech is fluent,  without dysarthria or aphasia.  Mood and affect are slightly defeated, depressed.    Cranial nerves: no loss of smell or taste reported  Pupils are equal and briskly reactive to light. Funduscopic exam deferred. .  Extraocular movements in vertical and horizontal planes were intact and without nystagmus.  No Diplopia but blurring . Visual fields by finger perimetry are intact. Hearing was intact to soft voice and finger rubbing.    Facial sensation intact to fine touch.  Facial motor strength is symmetric and tongue and uvula move midline.  Neck ROM : rotation, tilt and flexion extension were normal for age and shoulder shrug was symmetrical.    Motor exam:  Symmetric bulk, tone and ROM.   Normal tone without cog- wheeling, symmetric grip strength .   Sensory:   normal.  Proprioception tested in the upper extremities was normal.   Coordination:  normal speed.  The Finger-to-nose maneuver was intact without evidence of ataxia, dysmetria or tremor.   Gait and station: Patient could rise unassisted from a  seated position, walked without assistive device.     ASSESSMENT AND PLAN 62 y.o. year old female  here with:    1)  first visit under new CPAP, ESON nasal mask, autotitrator. Low compliance, there is a good compliance by daily attempts, but not hourly use.   2) the mask can be used a little loser - air leak data are good.  A change to another mask type can be entertained such as a nasal cradle or pillow.   3) try a travel pillow, horse shoe shaped, as a cpap pillow.   I plan to follow up through our NP within 5-6 months from  to assure over 4 hours of nightly us.e .   Etta Grandchild, Md [address] [place],  [place] [address]   CC: I will share my notes with  Etta Grandchild, Md [address] [place],  [place] [address] .  After spending a total time of  15  minutes face to face and additional time for physical and neurologic examination, review of laboratory studies,  personal review of imaging studies, reports and results of other testing and review of referral information / records as far as provided in visit,   Electronically signed by: Melvyn Novas, MD 09/17/2022 3:05 PM  [facility] Board certified by The [employer] of Sleep Medicine and Diplomate of the [employer] of Sleep Medicine. Board certified In Neurology through the ABPN, Fellow of the [employer] of Neu[employer].

## 2022-09-17 NOTE — Patient Instructions (Signed)

## 2022-10-28 ENCOUNTER — Other Ambulatory Visit: Payer: Self-pay | Admitting: Internal Medicine

## 2022-10-28 DIAGNOSIS — E785 Hyperlipidemia, unspecified: Secondary | ICD-10-CM

## 2022-10-29 ENCOUNTER — Other Ambulatory Visit: Payer: Self-pay | Admitting: Internal Medicine

## 2022-10-29 DIAGNOSIS — I10 Essential (primary) hypertension: Secondary | ICD-10-CM

## 2022-11-11 ENCOUNTER — Other Ambulatory Visit: Payer: Self-pay | Admitting: Internal Medicine

## 2022-11-11 DIAGNOSIS — F330 Major depressive disorder, recurrent, mild: Secondary | ICD-10-CM

## 2022-11-14 ENCOUNTER — Inpatient Hospital Stay (HOSPITAL_BASED_OUTPATIENT_CLINIC_OR_DEPARTMENT_OTHER): Payer: BC Managed Care – PPO | Admitting: Hematology and Oncology

## 2022-11-14 ENCOUNTER — Other Ambulatory Visit: Payer: Self-pay | Admitting: Hematology and Oncology

## 2022-11-14 ENCOUNTER — Inpatient Hospital Stay: Payer: BC Managed Care – PPO | Attending: Hematology and Oncology

## 2022-11-14 VITALS — BP 123/78 | HR 67 | Temp 97.5°F | Resp 13 | Wt 186.5 lb

## 2022-11-14 DIAGNOSIS — D751 Secondary polycythemia: Secondary | ICD-10-CM | POA: Diagnosis not present

## 2022-11-14 DIAGNOSIS — Z9012 Acquired absence of left breast and nipple: Secondary | ICD-10-CM | POA: Diagnosis not present

## 2022-11-14 DIAGNOSIS — D509 Iron deficiency anemia, unspecified: Secondary | ICD-10-CM | POA: Insufficient documentation

## 2022-11-14 DIAGNOSIS — Z803 Family history of malignant neoplasm of breast: Secondary | ICD-10-CM | POA: Diagnosis not present

## 2022-11-14 LAB — CBC WITH DIFFERENTIAL (CANCER CENTER ONLY)
Abs Immature Granulocytes: 0.02 10*3/uL (ref 0.00–0.07)
Basophils Absolute: 0.1 10*3/uL (ref 0.0–0.1)
Basophils Relative: 1 %
Eosinophils Absolute: 0.2 10*3/uL (ref 0.0–0.5)
Eosinophils Relative: 3 %
HCT: 50 % — ABNORMAL HIGH (ref 36.0–46.0)
Hemoglobin: 16.6 g/dL — ABNORMAL HIGH (ref 12.0–15.0)
Immature Granulocytes: 0 %
Lymphocytes Relative: 31 %
Lymphs Abs: 2.4 10*3/uL (ref 0.7–4.0)
MCH: 28.2 pg (ref 26.0–34.0)
MCHC: 33.2 g/dL (ref 30.0–36.0)
MCV: 84.9 fL (ref 80.0–100.0)
Monocytes Absolute: 0.8 10*3/uL (ref 0.1–1.0)
Monocytes Relative: 10 %
Neutro Abs: 4.3 10*3/uL (ref 1.7–7.7)
Neutrophils Relative %: 55 %
Platelet Count: 282 10*3/uL (ref 150–400)
RBC: 5.89 MIL/uL — ABNORMAL HIGH (ref 3.87–5.11)
RDW: 13.3 % (ref 11.5–15.5)
WBC Count: 7.7 10*3/uL (ref 4.0–10.5)
nRBC: 0 % (ref 0.0–0.2)

## 2022-11-14 LAB — CMP (CANCER CENTER ONLY)
ALT: 24 U/L (ref 0–44)
AST: 21 U/L (ref 15–41)
Albumin: 4.2 g/dL (ref 3.5–5.0)
Alkaline Phosphatase: 52 U/L (ref 38–126)
Anion gap: 6 (ref 5–15)
BUN: 14 mg/dL (ref 8–23)
CO2: 31 mmol/L (ref 22–32)
Calcium: 9.8 mg/dL (ref 8.9–10.3)
Chloride: 102 mmol/L (ref 98–111)
Creatinine: 0.81 mg/dL (ref 0.44–1.00)
GFR, Estimated: >60 mL/min (ref >60)
Glucose, Bld: 104 mg/dL — ABNORMAL HIGH (ref 70–99)
Potassium: 3 mmol/L — ABNORMAL LOW (ref 3.5–5.1)
Sodium: 139 mmol/L (ref 135–145)
Total Bilirubin: 0.6 mg/dL (ref 0.3–1.2)
Total Protein: 7 g/dL (ref 6.5–8.1)

## 2022-11-14 LAB — RETIC PANEL
Immature Retic Fract: 6 % (ref 2.3–15.9)
RBC.: 5.84 MIL/uL — ABNORMAL HIGH (ref 3.87–5.11)
Retic Count, Absolute: 92.9 10*3/uL (ref 19.0–186.0)
Retic Ct Pct: 1.6 % (ref 0.4–3.1)
Reticulocyte Hemoglobin: 33.4 pg (ref >27.9)

## 2022-11-14 LAB — IRON AND IRON BINDING CAPACITY (CC-WL,HP ONLY)
Iron: 50 ug/dL (ref 28–170)
Saturation Ratios: 16 % (ref 10.4–31.8)
TIBC: 321 ug/dL (ref 250–450)
UIBC: 271 ug/dL

## 2022-11-14 NOTE — Progress Notes (Signed)
[facility] [phone]   [phone]  PROGRESS NOTE  Patient Care Team: Etta Grandchild, MD as PCP - General (Internal Medicine) Duke Salvia, MD (Cardiology) Milagros Evener, MD (Psychiatry) Mateo Flow, MD as Consulting Physician (Ophthalmology)  Hematological/Oncological History  # Iron deficiency anemia of Unclear Etiology  # Polycythemia, Likely 2/2 to OSA 1) Labs from PCP, Dr. Sanda Linger -09/21/2020: WBC 7.8, Hgb 10.5 (L), MCV 64.5 (L), Plt 438 (H), Iron 19 (L), Ferritin 6 (L), Vitamin B12 186 (L), Folate 12.9.  2) 09/25/2020: Initiated weekly vitamin B12 injections with PCP. 3) 10/04/2020: Establish care with Georga Kaufmann PA-C 4) 10/27/2020-11/24/2020: Received IV venofer x 5 doses  HISTORY OF PRESENTING ILLNESS:  [name] [name] 63 y.o. female returns today for a follow up for history of iron deficiency and secondary polycythemia.   At today's visit, [name] [name] reports over the last few months she has been fine and "normal".  She reports that she has a CPAP machine and is using it but cannot quite tell the difference.  She reports that she may feel a little more refreshed in the morning but otherwise cannot quite tell the difference.  She notes that it is easier to "get up and go".  She notes that she has had no other major changes other than discontinuing her Wellbutrin.  Her weight loss continues and she is down to 186 pounds from 203 pounds in April.  She attributes this to the semaglutide controlling her appetite.  She notes that she has been taking Aleve for the past 3 or 4 years and she does not take it develops muscle pain and describes it as a "heaviness".  She denies fevers, chills, sweats, shortness of breath, chest pain or cough. Rest of the 10 point ROS is below.   MEDICAL HISTORY:  Past Medical History:  Diagnosis Date   Anemia    Anxiety    Bigeminy    Cognitive changes    Depression    Dysrhythmia    bigeminy   Elevated  liver enzymes 2017   undiagnosed and levels recently were more normal per pt   Erythromelalgia (HCC)    Hyperlipidemia    Hypertension    IBS (irritable bowel syndrome)    Migraine, unspecified, without mention of intractable migraine without mention of status migrainosus    PONV (postoperative nausea and vomiting)    Secondary polycythemia    Tachycardia, unspecified    on BBloc    SURGICAL HISTORY: Past Surgical History:  Procedure Laterality Date   Bilateral myofascial release for recurrent plantar faciitis     BLADDER SUSPENSION N/A 08/09/2015   Procedure: TRANSVAGINAL TAPE (TVT) PROCEDURE;  Surgeon: Carrington Clamp, MD;  Location: [facility];  Service: Gynecology;  Laterality: N/A;   CARPAL TUNNEL RELEASE     Colonoscopy with polypectomy  03/18/2014   Repeat 2021   CYSTOCELE REPAIR  08/09/2015   Procedure: ANTERIOR REPAIR (CYSTOCELE);  Surgeon: Carrington Clamp, MD;  Location: [facility];  Service: Gynecology;;   CYSTOSCOPY N/A 08/09/2015   Procedure: Bluford Kaufmann;  Surgeon: Carrington Clamp, MD;  Location: [facility];  Service: Gynecology;  Laterality: N/A;   GIVENS CAPSULE STUDY N/A 10/24/2020   Procedure: GIVENS CAPSULE STUDY;  Surgeon: Charna Elizabeth, MD;  Location: [facility];  Service: Endoscopy;  Laterality: N/A;   ROBOTIC ASSISTED TOTAL HYSTERECTOMY WITH BILATERAL SALPINGO OOPHERECTOMY Bilateral 08/09/2015   Procedure: ROBOTIC ASSISTED TOTAL HYSTERECTOMY WITH BILATERAL SALPINGO OOPHORECTOMY;  Surgeon: Carrington Clamp, MD;  Location: [facility]  ORS;  Service: Gynecology;  Laterality: Bilateral;   TRIGGER FINGER RELEASE     TUBAL LIGATION      SOCIAL HISTORY: Social History   Socioeconomic History   Marital status: Married    Spouse name: Not on file   Number of children: 2   Years of education: 12   Highest education level: High school graduate  Occupational History   Occupation: customer service/sales  Tobacco Use   Smoking status: Never   Smokeless tobacco: Never  Vaping Use    Vaping status: Never Used  Substance and Sexual Activity   Alcohol use: Yes    Comment: occasional wine   Drug use: No   Sexual activity: Yes    Partners: Male  Other Topics Concern   Not on file  Social History Narrative   HSG. 1 year [employer] [place]. Married '86-divorced after 12yrs; remarried  '96. No children, 2 step children. 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 Marriage is stable.      Lives at home with husband.   Right-handed.   One Diet Coke per day, 2 cups coffee per day.   Social Determinants of Health   Financial Resource Strain: Not on file  Food Insecurity: Not on file  Transportation Needs: Not on file  Physical Activity: Not on file  Stress: Not on file  Social Connections: Not on file  Intimate Partner Violence: Not on file    FAMILY HISTORY: Family History  Problem Relation Age of Onset   Cancer Mother        breast cancer-left mastectomy   Asthma Mother    Alzheimer's disease Mother    Transient ischemic attack Mother    Other Mother        ITP   Hypertension Father    Hyperlipidemia Father    Diabetes Father    Heart attack Father 4       CABG   Heart failure Father    Pancreatitis Father    Chronic fatigue Sister    Fibromyalgia Sister    Alzheimer's disease Maternal Grandmother    Breast cancer Maternal Grandmother     ALLERGIES:  is allergic to enalapril, codeine, and macrobid [nitrofurantoin].  MEDICATIONS:  Current Outpatient Medications  Medication Sig Dispense Refill   aspirin EC 81 MG tablet Take 81 mg by mouth daily. Swallow whole.     atorvastatin (LIPITOR) 10 MG tablet TAKE 1 TABLET BY MOUTH EVERY DAY 90 tablet 0   DULoxetine (CYMBALTA) 60 MG capsule TAKE 1 CAPSULE BY MOUTH EVERY DAY 30 capsule 0   estradiol (ESTRACE) 1 MG tablet Take 1 mg by mouth daily.     indapamide (LOZOL) 1.25 MG tablet TAKE 1 TABLET BY MOUTH EVERY DAY 90 tablet 0   metoprolol tartrate (LOPRESSOR) 25 MG  tablet TAKE 1 TABLET(25 MG) BY MOUTH TWICE DAILY 180 tablet 0   Naproxen Sodium (ALEVE PO) Take 2 tablets by mouth every 12 (twelve) hours.     omeprazole (PRILOSEC) 20 MG capsule Take 20 mg by mouth daily.     Semaglutide-Weight Management 1 MG/0.5ML SOAJ Inject 1 mg into the skin once a week for 28 days. 2 mL 0   [START ON 11/25/2022] Semaglutide-Weight Management 1.7 MG/0.75ML SOAJ Inject 1.7 mg into the skin once a week for 28 days. 3 mL 0   [START ON 12/24/2022] Semaglutide-Weight Management 2.4 MG/0.75ML SOAJ Inject 2.4 mg into the skin once a week for 28 days.  3 mL 0   No current facility-administered medications for this visit.    REVIEW OF SYSTEMS:   Constitutional: ( - ) fevers, ( - )  chills , ( - ) night sweats Eyes: ( - ) blurriness of vision, ( - ) double vision, ( - ) watery eyes Ears, nose, mouth, throat, and face: ( - ) mucositis, ( - ) sore throat Respiratory: ( - ) cough, ( -) dyspnea, ( - ) wheezes Cardiovascular: ( -) palpitation, ( - ) chest discomfort, ( - ) lower extremity swelling Gastrointestinal:  ( - ) nausea, ( - ) heartburn, ( - ) change in bowel habits Skin: ( - ) abnormal skin rashes Lymphatics: ( - ) new lymphadenopathy, ( - ) easy bruising Neurological: ( - ) numbness, ( - ) tingling, ( - ) new weaknesses Behavioral/Psych: ( - ) mood change, ( - ) new changes  All other systems were reviewed with the patient and are negative.  PHYSICAL EXAMINATION: ECOG PERFORMANCE STATUS: 1 - Symptomatic but completely ambulatory  Vitals:   11/14/22 1520  BP: 123/78  Pulse: 67  Resp: 13  Temp: (!) 97.5 F (36.4 C)  SpO2: 97%    Filed Weights   11/14/22 1520  Weight: 186 lb 8 oz (84.6 kg)     GENERAL: well appearing female in NAD  SKIN: skin color, texture, turgor are normal, EYES: conjunctiva are pink and non-injected, sclera clear OROPHARYNX: no exudate, no erythema; lips, buccal mucosa, and tongue normal  LUNGS: clear to auscultation and percussion with  normal breathing effort HEART: regular rate & rhythm and no murmurs and no lower extremity edema Musculoskeletal: no cyanosis of digits and no clubbing  PSYCH: alert & oriented x 3, fluent speech NEURO: no focal motor/sensory deficits  LABORATORY DATA:  I have reviewed the data as listed    Latest Ref Rng & Units 11/14/2022    2:44 PM 05/15/2022    9:32 AM 12/03/2021    2:27 PM  CBC  WBC 4.0 - 10.5 K/uL 7.7  7.2  9.2   Hemoglobin 12.0 - 15.0 g/dL 57.8  46.9  62.9   Hematocrit 36.0 - 46.0 % 50.0  49.8  50.8   Platelets 150 - 400 K/uL 282  285  328        Latest Ref Rng & Units 11/14/2022    2:44 PM 05/15/2022    9:32 AM 12/03/2021    2:27 PM  CMP  Glucose 70 - 99 mg/dL 528  97  413   BUN 8 - 23 mg/dL 14  13  15    Creatinine 0.44 - 1.00 mg/dL 2.44  0.10  2.72   Sodium 135 - 145 mmol/L 139  141  139   Potassium 3.5 - 5.1 mmol/L 3.0  4.0  3.0   Chloride 98 - 111 mmol/L 102  101  100   CO2 22 - 32 mmol/L 31  34  31   Calcium 8.9 - 10.3 mg/dL 9.8  9.8  53.6   Total Protein 6.5 - 8.1 g/dL 7.0  6.5  7.3   Total Bilirubin 0.3 - 1.2 mg/dL 0.6  0.6  0.6   Alkaline Phos 38 - 126 U/L 52  58  64   AST 15 - 41 U/L 21  21  22    ALT 0 - 44 U/L 24  28  29       ASSESSMENT & PLAN KAHLIE DEROCHE is a female who returns for a follow  up.  #Polycythemia, secondary: --Hgb was elevated since receiving IV iron back in August/September 2022. Patient is not taking oral iron at this time. She denies smoking, OSA or dehydration.  --Workup included erythropoietin levels, BCR/ABL FISH, MPN panel.  All were unremarkable. --Labs today show hemoglobin 16.6, WBC 7.7, MCV 84.9, Plt 282 --Patient was diagnosed with sleep apnea which is most likely the underlying cause. Patient is currently using her CPAP machine.  There has been a modest improvement in hemoglobin though no improvement in her other symptoms. --Continue to take ASA 81 mg daily. --Discussed that findings do not suggest an underlying bone marrow  disorder causing the polycythemia so do not recommend bone marrow biopsy at this time.  --RTC in 6 months to evaluate further.  #Iron deficiency anemia, resolved: --Uncertain etiology as patient denies any signs of bleeding.  --Patient underwent hysterectomy in 2017.  --Patient underwent endoscopy and colonoscopy on 07/14/2020 with Dr. Charna Elizabeth. Colonoscopy revealed small sessile polyp in the mid-sigmoid colon. EGD was unremarkable. --Underwent capsular endoscopy on 10/24/2020 which showed normal appearing small bowel without any source of bleeding.  --Received IV venofer x 5 doses from 10/27/2020-11/24/2020 --Most recent labs from 12/03/2021 showed no deficiency.   #Vitamin B12 deficiency, improved: --Most recent vitamin B12 level from 04/05/2021 was 702.  --Received vitamin B12 injections 1000 mcg x 4 doses. Last dose was on 11/10/2020. --Okay to hold addition B12 injections.   Follow up: -6 months with repeat labs.   No orders of the defined types were placed in this encounter.   All questions were answered. The patient knows to call the clinic with any problems, questions or concerns.  I have spent a total of 30 minutes minutes of face-to-face and non-face-to-face time, preparing to see the patient, performing a medically appropriate examination, counseling and educating the patient, ordering tests, documenting clinical information in the electronic health record, and care coordination.   Ulysees Barns, MD Department of Hematology/Oncology [facility] Phone: [phone] Pager: [phone] Email: [email or web address]

## 2022-11-15 LAB — FERRITIN: Ferritin: 71 ng/mL (ref 11–307)

## 2022-11-19 ENCOUNTER — Other Ambulatory Visit: Payer: Self-pay | Admitting: Internal Medicine

## 2022-11-19 ENCOUNTER — Telehealth: Payer: Self-pay | Admitting: Internal Medicine

## 2022-11-19 DIAGNOSIS — I10 Essential (primary) hypertension: Secondary | ICD-10-CM

## 2022-11-19 MED ORDER — INDAPAMIDE 1.25 MG PO TABS
ORAL_TABLET | ORAL | 0 refills | Status: DC
Start: 2022-11-19 — End: 2022-11-19

## 2022-11-19 NOTE — Telephone Encounter (Signed)
Patient would like her blood pressure pill filled until her appointment in October, 2024  Prescription Request  11/19/2022  LOV: 06/05/2022  What is the name of the medication or equipment? indapamide  Have you contacted your pharmacy to request a refill? Yes   Which pharmacy would you like this sent to?  [id number] - [place], [place] - [address] AT [facility] [address] [place] [address] Phone: [phone] Fax: [phone]    Patient notified that their request is being sent to the clinical staff for review and that they should receive a response within 2 business days.   Please advise at Mobile [phone] (mobile)

## 2022-12-03 ENCOUNTER — Other Ambulatory Visit: Payer: Self-pay | Admitting: Internal Medicine

## 2022-12-03 DIAGNOSIS — F330 Major depressive disorder, recurrent, mild: Secondary | ICD-10-CM

## 2022-12-24 ENCOUNTER — Ambulatory Visit (INDEPENDENT_AMBULATORY_CARE_PROVIDER_SITE_OTHER): Payer: BC Managed Care – PPO | Admitting: Internal Medicine

## 2022-12-24 ENCOUNTER — Encounter: Payer: Self-pay | Admitting: Internal Medicine

## 2022-12-24 ENCOUNTER — Other Ambulatory Visit: Payer: Self-pay | Admitting: Internal Medicine

## 2022-12-24 VITALS — BP 132/86 | HR 62 | Temp 98.1°F | Resp 16 | Ht 66.0 in | Wt 186.0 lb

## 2022-12-24 DIAGNOSIS — E876 Hypokalemia: Secondary | ICD-10-CM | POA: Insufficient documentation

## 2022-12-24 DIAGNOSIS — E118 Type 2 diabetes mellitus with unspecified complications: Secondary | ICD-10-CM

## 2022-12-24 DIAGNOSIS — I10 Essential (primary) hypertension: Secondary | ICD-10-CM

## 2022-12-24 DIAGNOSIS — E66811 Obesity, class 1: Secondary | ICD-10-CM

## 2022-12-24 DIAGNOSIS — T502X5A Adverse effect of carbonic-anhydrase inhibitors, benzothiadiazides and other diuretics, initial encounter: Secondary | ICD-10-CM | POA: Diagnosis not present

## 2022-12-24 DIAGNOSIS — Z23 Encounter for immunization: Secondary | ICD-10-CM | POA: Diagnosis not present

## 2022-12-24 DIAGNOSIS — F330 Major depressive disorder, recurrent, mild: Secondary | ICD-10-CM

## 2022-12-24 DIAGNOSIS — R9431 Abnormal electrocardiogram [ECG] [EKG]: Secondary | ICD-10-CM

## 2022-12-24 DIAGNOSIS — E785 Hyperlipidemia, unspecified: Secondary | ICD-10-CM

## 2022-12-24 LAB — URINALYSIS, ROUTINE W REFLEX MICROSCOPIC
Bilirubin Urine: NEGATIVE
Hgb urine dipstick: NEGATIVE
Ketones, ur: NEGATIVE
Leukocytes,Ua: NEGATIVE
Nitrite: NEGATIVE
RBC / HPF: NONE SEEN (ref 0–?)
Specific Gravity, Urine: 1.02 (ref 1.000–1.030)
Total Protein, Urine: NEGATIVE
Urine Glucose: NEGATIVE
Urobilinogen, UA: 1 (ref 0.0–1.0)
pH: 6 (ref 5.0–8.0)

## 2022-12-24 LAB — HEMOGLOBIN A1C: Hgb A1c MFr Bld: 5.2 % (ref 4.6–6.5)

## 2022-12-24 LAB — TSH: TSH: 1.66 u[IU]/mL (ref 0.35–5.50)

## 2022-12-24 MED ORDER — SEMAGLUTIDE-WEIGHT MANAGEMENT 2.4 MG/0.75ML ~~LOC~~ SOAJ: 2.4000 mg | SUBCUTANEOUS | 1 refills | Status: AC

## 2022-12-24 NOTE — Patient Instructions (Signed)
PLEASE STOP TAKING INDAPAMIDE  Hypokalemia Hypokalemia means that the amount of potassium in the blood is lower than normal. Potassium is a mineral (electrolyte) that helps regulate the amount of fluid in the body. It also stimulates muscle tightening (contraction) and helps nerves work properly. Normally, most of the body's potassium is inside cells, and only a very small amount is in the blood. Because the amount in the blood is so small, minor changes to potassium levels in the blood can be life-threatening. What are the causes? This condition may be caused by: Antibiotic medicine. Diarrhea or vomiting. Taking too much of a medicine that helps you have a bowel movement (laxative) can cause diarrhea and lead to hypokalemia. Chronic kidney disease (CKD). Medicines that help the body get rid of excess fluid (diuretics). Eating disorders, such as anorexia or bulimia. Low magnesium levels in the body. Sweating a lot. What are the signs or symptoms? Symptoms of this condition include: Weakness. Constipation. Fatigue. Muscle cramps. Mental confusion. Skipped heartbeats or irregular heartbeat (palpitations). Tingling or numbness. How is this diagnosed? This condition is diagnosed with a blood test. How is this treated? This condition may be treated by: Taking potassium supplements. Adjusting the medicines that you take. Eating more foods that contain a lot of potassium. If your potassium level is very low, you may need to get potassium through an IV and be monitored in the hospital. Follow these instructions at home: Eating and drinking  Eat a healthy diet. A healthy diet includes fresh fruits and vegetables, whole grains, healthy fats, and lean proteins. If told, eat more foods that contain a lot of potassium. These include: Nuts, such as peanuts and pistachios. Seeds, such as sunflower seeds and pumpkin seeds. Peas, lentils, and lima beans. Whole grain and bran cereals and  breads. Fresh fruits and vegetables, such as apricots, avocado, bananas, cantaloupe, kiwi, oranges, tomatoes, asparagus, and potatoes. Juices, such as orange, tomato, and prune. Lean meats, including fish. Milk and milk products, such as yogurt. General instructions Take over-the-counter and prescription medicines only as told by your health care provider. This includes vitamins, natural food products, and supplements. Keep all follow-up visits. This is important. Contact a health care provider if: You have weakness that gets worse. You feel your heart pounding or racing. You vomit. You have diarrhea. You have diabetes and you have trouble keeping your blood sugar in your target range. Get help right away if: You have chest pain. You have shortness of breath. You have vomiting or diarrhea that lasts for more than 2 days. You faint. These symptoms may be an emergency. Get help right away. Call 911. Do not wait to see if the symptoms will go away. Do not drive yourself to the hospital. Summary Hypokalemia means that the amount of potassium in the blood is lower than normal. This condition is diagnosed with a blood test. Hypokalemia may be treated by taking potassium supplements, adjusting the medicines that you take, or eating more foods that are high in potassium. If your potassium level is very low, you may need to get potassium through an IV and be monitored in the hospital. This information is not intended to replace advice given to you by your health care provider. Make sure you discuss any questions you have with your health care provider. Document Revised: 11/16/2020 Document Reviewed: 11/16/2020 Elsevier Patient Education  2024 [employer].

## 2022-12-24 NOTE — Progress Notes (Unsigned)
Subjective:  Patient ID: [name] [name], female    DOB: October 04, 1960  Age: 62 y.o. MRN: [id number]  CC: Hypertension, Hyperlipidemia, and Diabetes   HPI [name] [name] presents for f/up ----  Discussed the use of AI scribe software for clinical note transcription with the patient, who gave verbal consent to proceed.  History of Present Illness   The patient, with a history of hypertension, polycythemia, and depression, reports intermittent feelings of imbalance and occasional blurriness, though she is unsure if these symptoms are related to her hypertension or polycythemia. She denies experiencing headaches, chest pain, shortness of breath, or edema. She has not experienced any falls but notes a tendency to veer to the left while walking.   The patient's depression is reportedly well-managed, and she has self-discontinued Wellbutrin over a month ago without any adverse effects. She continues to take Duloxetine. She also reports constipation, which she attributes to her GLP-1 agonist.   The patient's hypertension is managed with an unspecified regimen, and she reports no symptoms of high blood pressure. She mentions that her potassium levels were reported to be low, but she has not viewed the lab results herself. She also takes a cholesterol medication, the name of which is not specified in the conversation.  The patient also reports taking Aleve for pain, though the source of the pain is not specified. She denies any recent changes to her medication regimen.       Outpatient Medications Prior to Visit  Medication Sig Dispense Refill   aspirin EC 81 MG tablet Take 81 mg by mouth daily. Swallow whole.     atorvastatin (LIPITOR) 10 MG tablet TAKE 1 TABLET BY MOUTH EVERY DAY 90 tablet 0   DULoxetine (CYMBALTA) 60 MG capsule TAKE 1 CAPSULE BY MOUTH EVERY DAY 30 capsule 0   estradiol (ESTRACE) 1 MG tablet Take 1 mg by mouth daily.     metoprolol tartrate (LOPRESSOR) 25 MG tablet TAKE 1  TABLET(25 MG) BY MOUTH TWICE DAILY 180 tablet 0   Naproxen Sodium (ALEVE PO) Take 2 tablets by mouth every 12 (twelve) hours.     omeprazole (PRILOSEC) 20 MG capsule Take 20 mg by mouth daily.     indapamide (LOZOL) 1.25 MG tablet TAKE 1 TABLET BY MOUTH EVERY DAY 30 tablet 0   Semaglutide-Weight Management 2.4 MG/0.75ML SOAJ Inject 2.4 mg into the skin once a week for 28 days. 3 mL 0   No facility-administered medications prior to visit.    ROS Review of Systems  Constitutional: Negative.  Negative for appetite change, chills, fatigue and fever.  HENT: Negative.  Negative for trouble swallowing.   Respiratory:  Negative for cough, chest tightness and shortness of breath.   Cardiovascular:  Negative for chest pain, palpitations and leg swelling.  Gastrointestinal:  Positive for constipation. Negative for abdominal pain, blood in stool, diarrhea, nausea and vomiting.  Genitourinary: Negative.  Negative for difficulty urinating.  Musculoskeletal:  Positive for arthralgias, myalgias and neck pain. Negative for back pain and joint swelling.  Skin: Negative.   Neurological: Negative.  Negative for dizziness and weakness.  Hematological:  Negative for adenopathy. Does not bruise/bleed easily.  Psychiatric/Behavioral:  Positive for dysphoric mood. Negative for agitation, behavioral problems, confusion, decreased concentration and sleep disturbance. The patient is not nervous/anxious.     Objective:  BP 132/86 (BP Location: Left Arm, Patient Position: Sitting, Cuff Size: Large)   Pulse 62   Temp 98.1 F (36.7 C) (Oral)   Resp 16  Ht 5\' 6"  (1.676 m)   Wt 186 lb (84.4 kg)   LMP  (LMP Unknown)   SpO2 97%   BMI 30.02 kg/m   BP Readings from Last 3 Encounters:  12/24/22 132/86  11/14/22 123/78  09/17/22 112/73    Wt Readings from Last 3 Encounters:  12/24/22 186 lb (84.4 kg)  11/14/22 186 lb 8 oz (84.6 kg)  09/17/22 190 lb (86.2 kg)    Physical Exam Vitals reviewed.   Constitutional:      Appearance: Normal appearance.  HENT:     Nose: Nose normal.     Mouth/Throat:     Mouth: Mucous membranes are moist.  Eyes:     General: No scleral icterus.    Conjunctiva/sclera: Conjunctivae normal.  Cardiovascular:     Rate and Rhythm: Regular rhythm. Bradycardia present.     Heart sounds: No murmur heard.    No friction rub. No gallop.     Comments: EKG - SB, 58 bpm No LVH Septal infarct pattern unchanged Pulmonary:     Effort: Pulmonary effort is normal.     Breath sounds: No stridor. No wheezing, rhonchi or rales.  Abdominal:     General: Abdomen is flat.     Palpations: There is no mass.     Tenderness: There is no abdominal tenderness. There is no guarding.     Hernia: No hernia is present.  Musculoskeletal:        General: Normal range of motion.     Cervical back: Neck supple.     Right lower leg: No edema.     Left lower leg: No edema.  Lymphadenopathy:     Cervical: No cervical adenopathy.  Skin:    General: Skin is warm and dry.     Findings: No rash.  Neurological:     General: No focal deficit present.     Mental Status: She is alert.  Psychiatric:        Mood and Affect: Mood normal.        Behavior: Behavior normal.     Lab Results  Component Value Date   WBC 7.7 11/14/2022   HGB 16.6 (H) 11/14/2022   HCT 50.0 (H) 11/14/2022   PLT 282 11/14/2022   GLUCOSE 100 (H) 12/24/2022   CHOL 135 12/24/2022   TRIG 95.0 12/24/2022   HDL 44.30 12/24/2022   LDLCALC 72 12/24/2022   ALT 24 11/14/2022   AST 21 11/14/2022   NA 139 12/24/2022   K 3.5 12/24/2022   CL 101 12/24/2022   CREATININE 0.78 12/24/2022   BUN 17 12/24/2022   CO2 31 12/24/2022   TSH 1.66 12/24/2022   HGBA1C 5.2 12/24/2022   MICROALBUR <0.7 12/24/2022    US Abdomen Limited RUQ (LIVER/GB)  Result Date: 07/23/2021 CLINICAL DATA:  Right upper quadrant abdominal pain. EXAM: ULTRASOUND ABDOMEN LIMITED RIGHT UPPER QUADRANT COMPARISON:  None Available. FINDINGS:  Gallbladder: No gallstones or wall thickening visualized. No sonographic Murphy sign noted by sonographer. Common bile duct: Diameter: 5 mm Liver: The liver demonstrates coarse echotexture and increased echogenicity, likely reflecting diffuse steatosis. No overt cirrhotic contour abnormalities or focal lesions are identified. There is no evidence of intrahepatic biliary ductal dilatation. Portal vein is patent on color Doppler imaging with normal direction of blood flow towards the liver. Other: None. IMPRESSION: Evidence of hepatic steatosis. Electronically Signed   By: Irish Lack M.D.   On: 07/23/2021 12:56    Assessment & Plan:   Essential hypertension- Her  blood pressure is well-controlled but she has mild hypokalemia.  Will discontinue the thiazide diuretic and start spironolactone. -     Basic metabolic panel; Future -     Urinalysis, Routine w reflex microscopic; Future -     TSH; Future -     Magnesium; Future -     EKG 12-Lead -     Spironolactone; Take 1 tablet (25 mg total) by mouth daily.  Dispense: 90 tablet; Refill: 0  Type II diabetes mellitus with manifestations (HCC) -     Hemoglobin A1c; Future -     Microalbumin / creatinine urine ratio; Future  Diuretic-induced hypokalemia -     Basic metabolic panel; Future -     Magnesium; Future -     Spironolactone; Take 1 tablet (25 mg total) by mouth daily.  Dispense: 90 tablet; Refill: 0  Hyperlipidemia with target LDL less than 130 - LDL goal achieved. Doing well on the statin  -     Lipid panel; Future -     CK; Future  Flu vaccine need -     Flu vaccine trivalent PF, 6mos and older(Flulaval,Afluria,Fluarix,Fluzone)  Obesity (BMI 30.0-34.9) -     Semaglutide-Weight Management; Inject 2.4 mg into the skin once a week.  Dispense: 9 mL; Refill: 1  Abnormal electrocardiogram (ECG) (EKG) -     ECHOCARDIOGRAM COMPLETE; Future     Follow-up: Return in about 3 months (around 03/26/2023).  Sanda Linger, MD

## 2022-12-25 ENCOUNTER — Encounter: Payer: Self-pay | Admitting: Internal Medicine

## 2022-12-25 LAB — BASIC METABOLIC PANEL
BUN: 17 mg/dL (ref 6–23)
CO2: 31 meq/L (ref 19–32)
Calcium: 9.9 mg/dL (ref 8.4–10.5)
Chloride: 101 meq/L (ref 96–112)
Creatinine, Ser: 0.78 mg/dL (ref 0.40–1.20)
GFR: 81.64 mL/min (ref >60.00)
Glucose, Bld: 100 mg/dL — ABNORMAL HIGH (ref 70–99)
Potassium: 3.5 meq/L (ref 3.5–5.1)
Sodium: 139 meq/L (ref 135–145)

## 2022-12-25 LAB — CK: Total CK: 62 U/L (ref 7–177)

## 2022-12-25 LAB — LIPID PANEL
Cholesterol: 135 mg/dL (ref 0–200)
HDL: 44.3 mg/dL (ref >39.00)
LDL Cholesterol: 72 mg/dL (ref 0–99)
NonHDL: 90.97
Total CHOL/HDL Ratio: 3
Triglycerides: 95 mg/dL (ref 0.0–149.0)
VLDL: 19 mg/dL (ref 0.0–40.0)

## 2022-12-25 LAB — MICROALBUMIN / CREATININE URINE RATIO
Creatinine,U: 119.7 mg/dL
Microalb Creat Ratio: 0.6 mg/g (ref 0.0–30.0)
Microalb, Ur: <0.7 mg/dL (ref 0.0–1.9)

## 2022-12-25 LAB — MAGNESIUM: Magnesium: 1.9 mg/dL (ref 1.5–2.5)

## 2022-12-25 MED ORDER — ATORVASTATIN CALCIUM 10 MG PO TABS
10.0000 mg | ORAL_TABLET | Freq: Every day | ORAL | 1 refills | Status: DC
Start: 2022-12-25 — End: 2023-06-17

## 2022-12-25 MED ORDER — SPIRONOLACTONE 25 MG PO TABS
25.0000 mg | ORAL_TABLET | Freq: Every day | ORAL | 0 refills | Status: DC
Start: 2022-12-25 — End: 2023-03-25

## 2022-12-30 ENCOUNTER — Ambulatory Visit (HOSPITAL_BASED_OUTPATIENT_CLINIC_OR_DEPARTMENT_OTHER): Payer: BC Managed Care – PPO

## 2022-12-30 DIAGNOSIS — R9431 Abnormal electrocardiogram [ECG] [EKG]: Secondary | ICD-10-CM

## 2022-12-30 LAB — ECHOCARDIOGRAM COMPLETE
Area-P 1/2: 3.37 cm2
S' Lateral: 2.8 cm

## 2023-01-06 ENCOUNTER — Other Ambulatory Visit: Payer: Self-pay | Admitting: Internal Medicine

## 2023-01-06 DIAGNOSIS — F330 Major depressive disorder, recurrent, mild: Secondary | ICD-10-CM

## 2023-01-24 DIAGNOSIS — M25561 Pain in right knee: Secondary | ICD-10-CM | POA: Diagnosis not present

## 2023-01-24 DIAGNOSIS — M25552 Pain in left hip: Secondary | ICD-10-CM | POA: Diagnosis not present

## 2023-01-29 DIAGNOSIS — H539 Unspecified visual disturbance: Secondary | ICD-10-CM | POA: Diagnosis not present

## 2023-01-29 DIAGNOSIS — E119 Type 2 diabetes mellitus without complications: Secondary | ICD-10-CM | POA: Diagnosis not present

## 2023-01-29 DIAGNOSIS — H2513 Age-related nuclear cataract, bilateral: Secondary | ICD-10-CM | POA: Diagnosis not present

## 2023-01-29 DIAGNOSIS — H524 Presbyopia: Secondary | ICD-10-CM | POA: Diagnosis not present

## 2023-01-29 DIAGNOSIS — H04123 Dry eye syndrome of bilateral lacrimal glands: Secondary | ICD-10-CM | POA: Diagnosis not present

## 2023-01-29 LAB — HM DIABETES EYE EXAM

## 2023-01-30 ENCOUNTER — Encounter: Payer: Self-pay | Admitting: Internal Medicine

## 2023-03-22 ENCOUNTER — Other Ambulatory Visit: Payer: Self-pay | Admitting: Internal Medicine

## 2023-03-22 DIAGNOSIS — E876 Hypokalemia: Secondary | ICD-10-CM

## 2023-03-22 DIAGNOSIS — I10 Essential (primary) hypertension: Secondary | ICD-10-CM

## 2023-04-01 ENCOUNTER — Ambulatory Visit: Payer: BC Managed Care – PPO | Admitting: Adult Health

## 2023-05-14 ENCOUNTER — Other Ambulatory Visit: Payer: Self-pay | Admitting: Hematology and Oncology

## 2023-05-14 ENCOUNTER — Telehealth: Payer: Self-pay | Admitting: Hematology and Oncology

## 2023-05-14 ENCOUNTER — Inpatient Hospital Stay: Payer: BC Managed Care – PPO

## 2023-05-14 DIAGNOSIS — D751 Secondary polycythemia: Secondary | ICD-10-CM

## 2023-05-15 ENCOUNTER — Ambulatory Visit: Payer: BC Managed Care – PPO | Admitting: Hematology and Oncology

## 2023-06-17 ENCOUNTER — Other Ambulatory Visit: Payer: Self-pay | Admitting: Internal Medicine

## 2023-06-17 DIAGNOSIS — E785 Hyperlipidemia, unspecified: Secondary | ICD-10-CM

## 2023-06-22 ENCOUNTER — Other Ambulatory Visit: Payer: Self-pay | Admitting: Internal Medicine

## 2023-06-22 DIAGNOSIS — E876 Hypokalemia: Secondary | ICD-10-CM

## 2023-06-22 DIAGNOSIS — I10 Essential (primary) hypertension: Secondary | ICD-10-CM

## 2023-06-26 ENCOUNTER — Telehealth: Payer: Self-pay | Admitting: Internal Medicine

## 2023-06-26 NOTE — Telephone Encounter (Signed)
 Copied from CRM [id number]. Topic: Clinical - Request for Lab/Test Order >> Jun 26, 2023  3:03 PM [name] [name] wrote: Reason for CRM: Patient is requesting an order for lab to test her levels and iron. She states that she is really tired all the time. Patient is requesting a callback.

## 2023-06-27 NOTE — Telephone Encounter (Signed)
 Patient has been scheduled for an appointment.

## 2023-07-08 ENCOUNTER — Ambulatory Visit: Admitting: Internal Medicine

## 2023-07-08 ENCOUNTER — Encounter: Payer: Self-pay | Admitting: Internal Medicine

## 2023-07-08 VITALS — BP 148/86 | HR 73 | Temp 97.8°F | Resp 16 | Ht 66.0 in | Wt 203.8 lb

## 2023-07-08 DIAGNOSIS — Z0000 Encounter for general adult medical examination without abnormal findings: Secondary | ICD-10-CM

## 2023-07-08 DIAGNOSIS — R270 Ataxia, unspecified: Secondary | ICD-10-CM | POA: Diagnosis not present

## 2023-07-08 DIAGNOSIS — Z860100 Personal history of colon polyps, unspecified: Secondary | ICD-10-CM | POA: Insufficient documentation

## 2023-07-08 DIAGNOSIS — E785 Hyperlipidemia, unspecified: Secondary | ICD-10-CM

## 2023-07-08 DIAGNOSIS — I10 Essential (primary) hypertension: Secondary | ICD-10-CM | POA: Diagnosis not present

## 2023-07-08 DIAGNOSIS — R531 Weakness: Secondary | ICD-10-CM

## 2023-07-08 DIAGNOSIS — E118 Type 2 diabetes mellitus with unspecified complications: Secondary | ICD-10-CM

## 2023-07-08 DIAGNOSIS — D751 Secondary polycythemia: Secondary | ICD-10-CM | POA: Diagnosis not present

## 2023-07-08 DIAGNOSIS — Z0001 Encounter for general adult medical examination with abnormal findings: Secondary | ICD-10-CM

## 2023-07-08 LAB — CBC WITH DIFFERENTIAL/PLATELET
Basophils Absolute: 0.1 10*3/uL (ref 0.0–0.1)
Basophils Relative: 1.1 % (ref 0.0–3.0)
Eosinophils Absolute: 0.3 10*3/uL (ref 0.0–0.7)
Eosinophils Relative: 3.6 % (ref 0.0–5.0)
HCT: 51.4 % — ABNORMAL HIGH (ref 36.0–46.0)
Hemoglobin: 16.8 g/dL — ABNORMAL HIGH (ref 12.0–15.0)
Lymphocytes Relative: 27.3 % (ref 12.0–46.0)
Lymphs Abs: 2.5 10*3/uL (ref 0.7–4.0)
MCHC: 32.7 g/dL (ref 30.0–36.0)
MCV: 86.7 fl (ref 78.0–100.0)
Monocytes Absolute: 1.1 10*3/uL — ABNORMAL HIGH (ref 0.1–1.0)
Monocytes Relative: 12.4 % — ABNORMAL HIGH (ref 3.0–12.0)
Neutro Abs: 5 10*3/uL (ref 1.4–7.7)
Neutrophils Relative %: 55.6 % (ref 43.0–77.0)
Platelets: 253 10*3/uL (ref 150.0–400.0)
RBC: 5.93 Mil/uL — ABNORMAL HIGH (ref 3.87–5.11)
RDW: 13.6 % (ref 11.5–15.5)
WBC: 9 10*3/uL (ref 4.0–10.5)

## 2023-07-08 LAB — BASIC METABOLIC PANEL WITH GFR
BUN: 18 mg/dL (ref 6–23)
CO2: 29 meq/L (ref 19–32)
Calcium: 9.6 mg/dL (ref 8.4–10.5)
Chloride: 102 meq/L (ref 96–112)
Creatinine, Ser: 0.85 mg/dL (ref 0.40–1.20)
GFR: 73.36 mL/min (ref >60.00)
Glucose, Bld: 99 mg/dL (ref 70–99)
Potassium: 4.3 meq/L (ref 3.5–5.1)
Sodium: 137 meq/L (ref 135–145)

## 2023-07-08 LAB — HEPATIC FUNCTION PANEL
ALT: 26 U/L (ref 0–35)
AST: 21 U/L (ref 0–37)
Albumin: 4.3 g/dL (ref 3.5–5.2)
Alkaline Phosphatase: 62 U/L (ref 39–117)
Bilirubin, Direct: 0.1 mg/dL (ref 0.0–0.3)
Total Bilirubin: 0.5 mg/dL (ref 0.2–1.2)
Total Protein: 7 g/dL (ref 6.0–8.3)

## 2023-07-08 NOTE — Patient Instructions (Signed)

## 2023-07-08 NOTE — Progress Notes (Unsigned)
 Subjective:  Patient ID: [name] [name], female    DOB: 27-Jul-1960  Age: 63 y.o. MRN: [id number]  CC: Annual Exam and Hypertension   HPI [name] [name] presents for a CPX and f/up ----  Discussed the use of AI scribe software for clinical note transcription with the patient, who gave verbal consent to proceed.  History of Present Illness   [name] [name] is a 63 year old female who presents with fatigue and imbalance.  For the past two months, she has experienced increased fatigue and a sensation of being off balance, describing it as feeling 'a little bit off balance' and 'lightheaded.'  She has a history of low potassium levels and is concerned about her current lab results, wondering if she might need potassium supplementation.  She has experienced two falls. The first fall occurred three to four months ago due to slipping on an oil-based insect spray, resulting in a groin injury. The second fall happened when she felt tired and off balance, leading to a cut on her lip, which did not require stitches.  She has noticed extremely dry skin, similar to when she had anemia, and associates this with her current symptoms of fatigue and imbalance.  She confirms a recent weight gain and mentions feeling a loss of muscle tone and limb weakness, which led her to stop an unspecified medication.  No chest pain, shortness of breath, dizziness, palpitations, vision or hearing problems, numbness, weakness, or tingling. No trouble sleeping and usually falls asleep quickly.       Outpatient Medications Prior to Visit  Medication Sig Dispense Refill   aspirin EC 81 MG tablet Take 81 mg by mouth daily. Swallow whole.     atorvastatin  (LIPITOR) 10 MG tablet Take 1 tablet (10 mg total) by mouth daily. Schedule an appt with PCP for further refills 30 tablet 0   DULoxetine  (CYMBALTA ) 60 MG capsule TAKE 1 CAPSULE BY MOUTH EVERY DAY 90 capsule 1   estradiol  (ESTRACE ) 1 MG tablet Take 1 mg by mouth daily.      metoprolol  tartrate (LOPRESSOR ) 25 MG tablet TAKE 1 TABLET(25 MG) BY MOUTH TWICE DAILY 180 tablet 0   Naproxen Sodium (ALEVE PO) Take 2 tablets by mouth every 12 (twelve) hours.     omeprazole (PRILOSEC) 20 MG capsule Take 20 mg by mouth daily.     spironolactone  (ALDACTONE ) 25 MG tablet Take 1 tablet (25 mg total) by mouth daily. NEED APPOINTMENT FOR FURTHER REFILLS. 30 tablet 0   No facility-administered medications prior to visit.    ROS Review of Systems  Objective:  Pulse 73   Temp 97.8 F (36.6 C) (Oral)   Ht 5\' 6"  (1.676 m)   Wt 203 lb 12.8 oz (92.4 kg)   LMP  (LMP Unknown)   SpO2 96%   BMI 32.89 kg/m   BP Readings from Last 3 Encounters:  12/24/22 132/86  11/14/22 123/78  09/17/22 112/73    Wt Readings from Last 3 Encounters:  07/08/23 203 lb 12.8 oz (92.4 kg)  12/24/22 186 lb (84.4 kg)  11/14/22 186 lb 8 oz (84.6 kg)    Physical Exam Vitals reviewed.  HENT:     Nose: Nose normal.     Mouth/Throat:     Mouth: Mucous membranes are moist.  Eyes:     General: No scleral icterus.    Extraocular Movements: Extraocular movements intact.     Conjunctiva/sclera: Conjunctivae normal.     Pupils: Pupils are equal, round,  and reactive to light.  Cardiovascular:     Rate and Rhythm: Normal rate and regular rhythm.     Heart sounds: No murmur heard.    No friction rub. No gallop.     Comments: EKG- NSR, 63 bpm No LVH, Q waves, or ST/T wave changes  Pulmonary:     Effort: Pulmonary effort is normal.     Breath sounds: No stridor. No wheezing, rhonchi or rales.  Abdominal:     General: Abdomen is flat.     Palpations: There is no mass.     Tenderness: There is no abdominal tenderness. There is no guarding.     Hernia: No hernia is present.  Musculoskeletal:        General: Normal range of motion.     Cervical back: Neck supple.     Right lower leg: No edema.     Left lower leg: No edema.  Lymphadenopathy:     Cervical: No cervical adenopathy.  Skin:     General: Skin is warm and dry.  Neurological:     Mental Status: She is alert.     Cranial Nerves: Cranial nerves 2-12 are intact. No cranial nerve deficit.     Sensory: Sensation is intact. No sensory deficit.     Motor: No weakness.     Coordination: Romberg sign positive. Coordination abnormal. Finger-Nose-Finger Test and Heel to [facility] Test normal. Rapid alternating movements normal.     Gait: Gait is intact. Gait and tandem walk normal.     Deep Tendon Reflexes: Reflexes normal.     Reflex Scores:      Tricep reflexes are 0 on the right side and 0 on the left side.      Bicep reflexes are 0 on the right side and 0 on the left side.      Brachioradialis reflexes are 0 on the right side and 0 on the left side.      Patellar reflexes are 0 on the right side and 0 on the left side.      Achilles reflexes are 0 on the right side and 0 on the left side.    Lab Results  Component Value Date   WBC 7.7 11/14/2022   HGB 16.6 (H) 11/14/2022   HCT 50.0 (H) 11/14/2022   PLT 282 11/14/2022   GLUCOSE 100 (H) 12/24/2022   CHOL 135 12/24/2022   TRIG 95.0 12/24/2022   HDL 44.30 12/24/2022   LDLCALC 72 12/24/2022   ALT 24 11/14/2022   AST 21 11/14/2022   NA 139 12/24/2022   K 3.5 12/24/2022   CL 101 12/24/2022   CREATININE 0.78 12/24/2022   BUN 17 12/24/2022   CO2 31 12/24/2022   TSH 1.66 12/24/2022   HGBA1C 5.2 12/24/2022   MICROALBUR <0.7 12/24/2022    US  Abdomen Limited RUQ (LIVER/GB) Result Date: 07/23/2021 CLINICAL DATA:  Right upper quadrant abdominal pain. EXAM: ULTRASOUND ABDOMEN LIMITED RIGHT UPPER QUADRANT COMPARISON:  None Available. FINDINGS: Gallbladder: No gallstones or wall thickening visualized. No sonographic Murphy sign noted by sonographer. Common bile duct: Diameter: 5 mm Liver: The liver demonstrates coarse echotexture and increased echogenicity, likely reflecting diffuse steatosis. No overt cirrhotic contour abnormalities or focal lesions are identified. There is no  evidence of intrahepatic biliary ductal dilatation. Portal vein is patent on color Doppler imaging with normal direction of blood flow towards the liver. Other: None. IMPRESSION: Evidence of hepatic steatosis. Electronically Signed   By: Georganne Kind.D.  On: 07/23/2021 12:56    Assessment & Plan:  Erythrocytosis -     CBC with Differential/Platelet; Future  Type II diabetes mellitus with manifestations (HCC) -     Basic metabolic panel with GFR; Future -     Hemoglobin A1c; Future  Hyperlipidemia with target LDL less than 130 -     Hepatic function panel; Future  Encounter for general adult medical examination with abnormal findings  Essential hypertension -     Basic metabolic panel with GFR; Future -     CBC with Differential/Platelet; Future -     EKG 12-Lead  Ataxia -     Folate; Future -     Vitamin B12; Future -     MR BRAIN WO CONTRAST; Future     Follow-up: Return in about 6 months (around 01/07/2024).  Sandra Crouch, MD

## 2023-07-09 ENCOUNTER — Encounter: Payer: Self-pay | Admitting: Internal Medicine

## 2023-07-09 ENCOUNTER — Other Ambulatory Visit: Payer: Self-pay | Admitting: Internal Medicine

## 2023-07-09 DIAGNOSIS — F330 Major depressive disorder, recurrent, mild: Secondary | ICD-10-CM

## 2023-07-09 LAB — FOLATE: Folate: 11.1 ng/mL (ref >5.9)

## 2023-07-09 LAB — VITAMIN B12: Vitamin B-12: 326 pg/mL (ref 211–911)

## 2023-07-09 LAB — HEMOGLOBIN A1C: Hgb A1c MFr Bld: 5.7 % (ref 4.6–6.5)

## 2023-07-10 ENCOUNTER — Encounter: Payer: Self-pay | Admitting: Internal Medicine

## 2023-07-11 ENCOUNTER — Encounter: Payer: Self-pay | Admitting: Internal Medicine

## 2023-07-11 DIAGNOSIS — R531 Weakness: Secondary | ICD-10-CM | POA: Insufficient documentation

## 2023-07-11 MED ORDER — ATORVASTATIN CALCIUM 10 MG PO TABS: 10.0000 mg | ORAL_TABLET | Freq: Every day | ORAL | 1 refills | Status: DC

## 2023-07-13 ENCOUNTER — Ambulatory Visit
Admission: RE | Admit: 2023-07-13 | Discharge: 2023-07-13 | Disposition: A | Discharge status: Home / Self Care | Source: Ambulatory Visit | Attending: Internal Medicine | Admitting: Internal Medicine

## 2023-07-13 DIAGNOSIS — R270 Ataxia, unspecified: Secondary | ICD-10-CM

## 2023-07-21 ENCOUNTER — Other Ambulatory Visit

## 2023-07-21 DIAGNOSIS — R270 Ataxia, unspecified: Secondary | ICD-10-CM

## 2023-07-21 DIAGNOSIS — R531 Weakness: Secondary | ICD-10-CM | POA: Diagnosis not present

## 2023-07-23 ENCOUNTER — Telehealth: Payer: Self-pay | Admitting: Physician Assistant

## 2023-07-24 ENCOUNTER — Other Ambulatory Visit: Payer: Self-pay | Admitting: Internal Medicine

## 2023-07-24 DIAGNOSIS — T502X5A Adverse effect of carbonic-anhydrase inhibitors, benzothiadiazides and other diuretics, initial encounter: Secondary | ICD-10-CM

## 2023-07-24 DIAGNOSIS — I10 Essential (primary) hypertension: Secondary | ICD-10-CM

## 2023-07-27 ENCOUNTER — Encounter: Payer: Self-pay | Admitting: Internal Medicine

## 2023-07-27 LAB — ACETYLCHOLINE RECEPTOR, BINDING: A CHR BINDING ABS: <0.3 nmol/L

## 2023-07-27 LAB — STRIATED MUSCLE ANTIBODY: STRIATED MUSCLE AB SCREEN: NEGATIVE

## 2023-08-04 ENCOUNTER — Other Ambulatory Visit

## 2023-08-04 ENCOUNTER — Ambulatory Visit: Admitting: Hematology and Oncology

## 2023-08-06 ENCOUNTER — Inpatient Hospital Stay: Attending: Hematology and Oncology

## 2023-08-06 ENCOUNTER — Inpatient Hospital Stay: Admitting: Hematology and Oncology

## 2023-08-06 VITALS — BP 171/81 | HR 61 | Temp 97.8°F | Resp 13 | Wt 209.6 lb

## 2023-08-06 DIAGNOSIS — E538 Deficiency of other specified B group vitamins: Secondary | ICD-10-CM | POA: Insufficient documentation

## 2023-08-06 DIAGNOSIS — D508 Other iron deficiency anemias: Secondary | ICD-10-CM

## 2023-08-06 DIAGNOSIS — Z803 Family history of malignant neoplasm of breast: Secondary | ICD-10-CM | POA: Insufficient documentation

## 2023-08-06 DIAGNOSIS — D751 Secondary polycythemia: Secondary | ICD-10-CM | POA: Insufficient documentation

## 2023-08-06 DIAGNOSIS — D509 Iron deficiency anemia, unspecified: Secondary | ICD-10-CM | POA: Diagnosis not present

## 2023-08-06 DIAGNOSIS — Z90710 Acquired absence of both cervix and uterus: Secondary | ICD-10-CM | POA: Diagnosis not present

## 2023-08-06 LAB — RETIC PANEL
Immature Retic Fract: 9.9 % (ref 2.3–15.9)
RBC.: 5.93 MIL/uL — ABNORMAL HIGH (ref 3.87–5.11)
Retic Count, Absolute: 98.4 10*3/uL (ref 19.0–186.0)
Retic Ct Pct: 1.7 % (ref 0.4–3.1)
Reticulocyte Hemoglobin: 32.7 pg (ref >27.9)

## 2023-08-06 LAB — CBC WITH DIFFERENTIAL (CANCER CENTER ONLY)
Abs Immature Granulocytes: 0.03 10*3/uL (ref 0.00–0.07)
Basophils Absolute: 0.1 10*3/uL (ref 0.0–0.1)
Basophils Relative: 1 %
Eosinophils Absolute: 0.3 10*3/uL (ref 0.0–0.5)
Eosinophils Relative: 4 %
HCT: 51.1 % — ABNORMAL HIGH (ref 36.0–46.0)
Hemoglobin: 17.1 g/dL — ABNORMAL HIGH (ref 12.0–15.0)
Immature Granulocytes: 0 %
Lymphocytes Relative: 25 %
Lymphs Abs: 2.1 10*3/uL (ref 0.7–4.0)
MCH: 28.2 pg (ref 26.0–34.0)
MCHC: 33.5 g/dL (ref 30.0–36.0)
MCV: 84.3 fL (ref 80.0–100.0)
Monocytes Absolute: 1 10*3/uL (ref 0.1–1.0)
Monocytes Relative: 12 %
Neutro Abs: 4.8 10*3/uL (ref 1.7–7.7)
Neutrophils Relative %: 58 %
Platelet Count: 272 10*3/uL (ref 150–400)
RBC: 6.06 MIL/uL — ABNORMAL HIGH (ref 3.87–5.11)
RDW: 13.3 % (ref 11.5–15.5)
WBC Count: 8.3 10*3/uL (ref 4.0–10.5)
nRBC: 0 % (ref 0.0–0.2)

## 2023-08-06 LAB — CMP (CANCER CENTER ONLY)
ALT: 26 U/L (ref 0–44)
AST: 23 U/L (ref 15–41)
Albumin: 4.3 g/dL (ref 3.5–5.0)
Alkaline Phosphatase: 70 U/L (ref 38–126)
Anion gap: 5 (ref 5–15)
BUN: 15 mg/dL (ref 8–23)
CO2: 30 mmol/L (ref 22–32)
Calcium: 9.6 mg/dL (ref 8.9–10.3)
Chloride: 103 mmol/L (ref 98–111)
Creatinine: 0.77 mg/dL (ref 0.44–1.00)
GFR, Estimated: >60 mL/min (ref >60)
Glucose, Bld: 98 mg/dL (ref 70–99)
Potassium: 3.9 mmol/L (ref 3.5–5.1)
Sodium: 138 mmol/L (ref 135–145)
Total Bilirubin: 0.6 mg/dL (ref 0.0–1.2)
Total Protein: 7.1 g/dL (ref 6.5–8.1)

## 2023-08-06 LAB — IRON AND IRON BINDING CAPACITY (CC-WL,HP ONLY)
Iron: 103 ug/dL (ref 28–170)
Saturation Ratios: 29 % (ref 10.4–31.8)
TIBC: 358 ug/dL (ref 250–450)
UIBC: 255 ug/dL (ref 148–442)

## 2023-08-06 LAB — FERRITIN: Ferritin: 59 ng/mL (ref 11–307)

## 2023-08-06 NOTE — Progress Notes (Signed)
 [facility] Telephone:([phone]) [phone]   [phone]  PROGRESS NOTE  Patient Care Team: Arcadio Knuckles, MD as PCP - General (Internal Medicine) Verona Goodwill, MD (Cardiology) Daphine Eagle, MD (Psychiatry) Amedeo Jupiter, MD as Consulting Physician (Ophthalmology)  Hematological/Oncological History  # Iron  deficiency anemia of Unclear Etiology  # Polycythemia, Likely 2/2 to OSA 1) Labs from PCP, Dr. Sandra Crouch -09/21/2020: WBC 7.8, Hgb 10.5 (L), MCV 64.5 (L), Plt 438 (H), Iron  19 (L), Ferritin 6 (L), Vitamin B12 186 (L), Folate 12.9.  2) 09/25/2020: Initiated weekly vitamin B12 injections with PCP. 3) 10/04/2020: Establish care with Wyline Hearing PA-C 4) 10/27/2020-11/24/2020: Received IV venofer  x 5 doses  HISTORY OF PRESENTING ILLNESS:  [name] [name] 63 y.o. female returns today for a follow up for history of iron  deficiency and secondary polycythemia.   At today's visit, [name] [name] reports she has been well overall in the interim since our last visit.  She reports that she has been about the same with the same level of fatigue and interim since her last visit.  She reports that she has started with a CPAP machine but "could not tell a difference".  She notes that it has been helping with sleeping and breathing some but has not reduced her number of red blood cells.  She reports that she has also has been having issues with her reflexes and balance.  She notes that she is being evaluated by her primary care provider for this.  She notes that she has been found to be ANA positive.  She recently began taking biotin as well as a B complex vitamin.  Otherwise she currently denies any fevers, chills, sweats, nausea, Oni or diarrhea.  Full 10 point ROS is otherwise negative.  MEDICAL HISTORY:  Past Medical History:  Diagnosis Date   Anemia    Anxiety    Bigeminy    Cognitive changes    Depression    Dysrhythmia    bigeminy   Elevated liver enzymes 2017    undiagnosed and levels recently were more normal per pt   Erythromelalgia (HCC)    Hyperlipidemia    Hypertension    IBS (irritable bowel syndrome)    Migraine, unspecified, without mention of intractable migraine without mention of status migrainosus    PONV (postoperative nausea and vomiting)    Secondary polycythemia    Tachycardia, unspecified    on BBloc    SURGICAL HISTORY: Past Surgical History:  Procedure Laterality Date   Bilateral myofascial release for recurrent plantar faciitis     BLADDER SUSPENSION N/A 08/09/2015   Procedure: TRANSVAGINAL TAPE (TVT) PROCEDURE;  Surgeon: Matt Song, MD;  Location: [facility];  Service: Gynecology;  Laterality: N/A;   CARPAL TUNNEL RELEASE     Colonoscopy with polypectomy  03/18/2014   Repeat 2021   CYSTOCELE REPAIR  08/09/2015   Procedure: ANTERIOR REPAIR (CYSTOCELE);  Surgeon: Matt Song, MD;  Location: [facility];  Service: Gynecology;;   CYSTOSCOPY N/A 08/09/2015   Procedure: Orin Birk;  Surgeon: Matt Song, MD;  Location: [facility];  Service: Gynecology;  Laterality: N/A;   GIVENS CAPSULE STUDY N/A 10/24/2020   Procedure: GIVENS CAPSULE STUDY;  Surgeon: Tami Falcon, MD;  Location: [facility];  Service: Endoscopy;  Laterality: N/A;   ROBOTIC ASSISTED TOTAL HYSTERECTOMY WITH BILATERAL SALPINGO OOPHERECTOMY Bilateral 08/09/2015   Procedure: ROBOTIC ASSISTED TOTAL HYSTERECTOMY WITH BILATERAL SALPINGO OOPHORECTOMY;  Surgeon: Matt Song, MD;  Location: [facility];  Service: Gynecology;  Laterality: Bilateral;  TRIGGER FINGER RELEASE     TUBAL LIGATION      SOCIAL HISTORY: Social History   Socioeconomic History   Marital status: Married    Spouse name: Not on file   Number of children: 2   Years of education: 12   Highest education level: 12th grade  Occupational History   Occupation: customer service/sales  Tobacco Use   Smoking status: Never   Smokeless tobacco: Never  Vaping Use   Vaping status: Never Used   Substance and Sexual Activity   Alcohol use: Yes    Comment: occasional wine   Drug use: No   Sexual activity: Yes    Partners: Male  Other Topics Concern   Not on file  Social History Narrative   HSG. 1 year [employer] [place] . Married '86-divorced after 74yrs; remarried  '96. No children, 2 step children. 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 Marriage is stable.      Lives at home with husband.   Right-handed.   One Diet Coke per day, 2 cups coffee per day.   Social Drivers of Corporate investment banker Strain: Low Risk  (12/23/2022)   Overall Financial Resource Strain (CARDIA)    Difficulty of Paying Living Expenses: Not very hard  Food Insecurity: No Food Insecurity (12/23/2022)   Hunger Vital Sign    Worried About Running Out of Food in the Last Year: Never true    Ran Out of Food in the Last Year: Never true  Transportation Needs: No Transportation Needs (12/23/2022)   PRAPARE - Administrator, Civil Service (Medical): No    Lack of Transportation (Non-Medical): No  Physical Activity: Inactive (12/23/2022)   Exercise Vital Sign    Days of Exercise per Week: 1 day    Minutes of Exercise per Session: 0 min  Stress: Not on file  Social Connections: Unknown (12/23/2022)   Social Connection and Isolation Panel [NHANES]    Frequency of Communication with Friends and Family: Three times a week    Frequency of Social Gatherings with Friends and Family: Patient declined    Attends Religious Services: Patient declined    Database administrator or Organizations: Yes    Attends Engineer, structural: 1 to 4 times per year    Marital Status: Married  Catering manager Violence: Not on file    FAMILY HISTORY: Family History  Problem Relation Age of Onset   Cancer Mother        breast cancer-left mastectomy   Asthma Mother    Alzheimer's disease Mother    Transient ischemic attack Mother    Other Mother        ITP    Hypertension Father    Hyperlipidemia Father    Diabetes Father    Heart attack Father 103       CABG   Heart failure Father    Pancreatitis Father    Chronic fatigue Sister    Fibromyalgia Sister    Alzheimer's disease Maternal Grandmother    Breast cancer Maternal Grandmother     ALLERGIES:  is allergic to enalapril , codeine, and macrobid [nitrofurantoin].  MEDICATIONS:  Current Outpatient Medications  Medication Sig Dispense Refill   aspirin EC 81 MG tablet Take 81 mg by mouth daily. Swallow whole.     atorvastatin  (LIPITOR) 10 MG tablet Take 1 tablet (10 mg total) by mouth daily. 90 tablet 1   DULoxetine  (CYMBALTA ) 60 MG  capsule TAKE 1 CAPSULE BY MOUTH EVERY DAY 90 capsule 1   estradiol  (ESTRACE ) 1 MG tablet Take 1 mg by mouth daily.     metoprolol  tartrate (LOPRESSOR ) 25 MG tablet TAKE 1 TABLET(25 MG) BY MOUTH TWICE DAILY 180 tablet 0   Naproxen Sodium (ALEVE PO) Take 2 tablets by mouth every 12 (twelve) hours.     omeprazole (PRILOSEC) 20 MG capsule Take 20 mg by mouth daily.     spironolactone  (ALDACTONE ) 25 MG tablet TAKE 1 TABLET BY MOUTH DAILY 90 tablet 1   No current facility-administered medications for this visit.    REVIEW OF SYSTEMS:   Constitutional: ( - ) fevers, ( - )  chills , ( - ) night sweats Eyes: ( - ) blurriness of vision, ( - ) double vision, ( - ) watery eyes Ears, nose, mouth, throat, and face: ( - ) mucositis, ( - ) sore throat Respiratory: ( - ) cough, ( -) dyspnea, ( - ) wheezes Cardiovascular: ( -) palpitation, ( - ) chest discomfort, ( - ) lower extremity swelling Gastrointestinal:  ( - ) nausea, ( - ) heartburn, ( - ) change in bowel habits Skin: ( - ) abnormal skin rashes Lymphatics: ( - ) new lymphadenopathy, ( - ) easy bruising Neurological: ( - ) numbness, ( - ) tingling, ( - ) new weaknesses Behavioral/Psych: ( - ) mood change, ( - ) new changes  All other systems were reviewed with the patient and are negative.  PHYSICAL  EXAMINATION: ECOG PERFORMANCE STATUS: 1 - Symptomatic but completely ambulatory  Vitals:   08/06/23 1156 08/06/23 1157  BP: (!) 161/90 (!) 171/81  Pulse: 61   Resp: 13   Temp: 97.8 F (36.6 C)   SpO2: 98%      Filed Weights   08/06/23 1156  Weight: 209 lb 9.6 oz (95.1 kg)      GENERAL: well appearing female in NAD  SKIN: skin color, texture, turgor are normal, EYES: conjunctiva are pink and non-injected, sclera clear OROPHARYNX: no exudate, no erythema; lips, buccal mucosa, and tongue normal  LUNGS: clear to auscultation and percussion with normal breathing effort HEART: regular rate & rhythm and no murmurs and no lower extremity edema Musculoskeletal: no cyanosis of digits and no clubbing  PSYCH: alert & oriented x 3, fluent speech NEURO: no focal motor/sensory deficits  LABORATORY DATA:  I have reviewed the data as listed    Latest Ref Rng & Units 08/06/2023   11:17 AM 07/08/2023    4:42 PM 11/14/2022    2:44 PM  CBC  WBC 4.0 - 10.5 K/uL 8.3  9.0  7.7   Hemoglobin 12.0 - 15.0 g/dL 16.1  09.6  04.5   Hematocrit 36.0 - 46.0 % 51.1  51.4  50.0   Platelets 150 - 400 K/uL 272  253.0  282        Latest Ref Rng & Units 08/06/2023   11:17 AM 07/08/2023    4:42 PM 12/24/2022    4:01 PM  CMP  Glucose 70 - 99 mg/dL 98  99  409   BUN 8 - 23 mg/dL 15  18  17    Creatinine 0.44 - 1.00 mg/dL 8.11  9.14  7.82   Sodium 135 - 145 mmol/L 138  137  139   Potassium 3.5 - 5.1 mmol/L 3.9  4.3  3.5   Chloride 98 - 111 mmol/L 103  102  101   CO2 22 - 32 mmol/L 30  29  31   Calcium  8.9 - 10.3 mg/dL 9.6  9.6  9.9   Total Protein 6.5 - 8.1 g/dL 7.1  7.0    Total Bilirubin 0.0 - 1.2 mg/dL 0.6  0.5    Alkaline Phos 38 - 126 U/L 70  62    AST 15 - 41 U/L 23  21    ALT 0 - 44 U/L 26  26       ASSESSMENT & PLAN [name] [name] is a female who returns for a follow up.  #Polycythemia, secondary: --Hgb was elevated since receiving IV iron  back in August/September 2022. Patient is not  taking oral iron  at this time. She denies smoking, OSA or dehydration.  --Workup included erythropoietin  levels, BCR/ABL FISH, MPN panel.  All were unremarkable. --Labs today show hemoglobin 17.1, hematocrit 51.1, platelets 272, white blood cell count 8.3 --Patient was diagnosed with sleep apnea which is most likely the underlying cause. Patient is currently using her CPAP machine.  There has been a modest improvement in hemoglobin though no improvement in her other symptoms. --Continue to take ASA 81 mg daily. --Discussed that findings do not suggest an underlying bone marrow disorder causing the polycythemia so do not recommend bone marrow biopsy at this time.  --RTC in 6 months to evaluate further.  #Iron  deficiency anemia, resolved: --Uncertain etiology as patient denies any signs of bleeding.  --Patient underwent hysterectomy in 2017.  --Patient underwent endoscopy and colonoscopy on 07/14/2020 with Dr. Tami Falcon. Colonoscopy revealed small sessile polyp in the mid-sigmoid colon. EGD was unremarkable. --Underwent capsular endoscopy on 10/24/2020 which showed normal appearing small bowel without any source of bleeding.  --Received IV venofer  x 5 doses from 10/27/2020-11/24/2020 --Most recent labs from 12/03/2021 showed no deficiency.   #Vitamin B12 deficiency, improved: --Most recent vitamin B12 level from 04/05/2021 was 702.  --Received vitamin B12 injections 1000 mcg x 4 doses. Last dose was on 11/10/2020. --Okay to hold addition B12 injections.   Follow up: -6 months with repeat labs.   No orders of the defined types were placed in this encounter.   All questions were answered. The patient knows to call the clinic with any problems, questions or concerns.  I have spent a total of 30 minutes minutes of face-to-face and non-face-to-face time, preparing to see the patient, performing a medically appropriate examination, counseling and educating the patient, ordering tests, documenting  clinical information in the electronic health record, and care coordination.   Rogerio Clay, MD Department of Hematology/Oncology [facility] at [facility] Phone: [phone] Pager: [phone] Email: [email or web address]

## 2023-08-16 ENCOUNTER — Encounter: Payer: Self-pay | Admitting: Physician Assistant

## 2023-08-18 ENCOUNTER — Other Ambulatory Visit: Payer: Self-pay | Admitting: Internal Medicine

## 2023-08-18 DIAGNOSIS — D1801 Hemangioma of skin and subcutaneous tissue: Secondary | ICD-10-CM | POA: Diagnosis not present

## 2023-08-18 DIAGNOSIS — R768 Other specified abnormal immunological findings in serum: Secondary | ICD-10-CM

## 2023-08-18 DIAGNOSIS — L812 Freckles: Secondary | ICD-10-CM | POA: Diagnosis not present

## 2023-08-18 DIAGNOSIS — L821 Other seborrheic keratosis: Secondary | ICD-10-CM | POA: Diagnosis not present

## 2023-08-19 ENCOUNTER — Ambulatory Visit: Payer: Self-pay | Admitting: *Deleted

## 2023-08-19 NOTE — Telephone Encounter (Signed)
-----   Message from Nurse Nellie Banas T sent at 08/18/2023  4:34 PM EDT -----  ----- Message ----- From: Ander Bame, MD Sent: 08/16/2023   4:21 PM EDT To: Alana Hoyle, RN  Please let Ms. Vanyo know that her iron  levels look excellent and there is no need for IV iron  infusion at this time.  We will continue to monitor and plan to see her back in 6 months time. ----- Message ----- From: Dannis Dy, [facility] Sent: 08/06/2023  11:39 AM EDT To: Ander Bame, MD

## 2023-08-19 NOTE — Telephone Encounter (Signed)
 Contacted patient per MD request with message below. Patient verbalized understanding of lab results. Confirmed follow up appt with her on 02/14/24.

## 2023-08-30 ENCOUNTER — Other Ambulatory Visit: Payer: Self-pay | Admitting: Internal Medicine

## 2023-08-30 DIAGNOSIS — F330 Major depressive disorder, recurrent, mild: Secondary | ICD-10-CM

## 2024-01-04 ENCOUNTER — Other Ambulatory Visit: Payer: Self-pay

## 2024-01-04 ENCOUNTER — Emergency Department (HOSPITAL_COMMUNITY)
Admission: EM | Admit: 2024-01-04 | Discharge: 2024-01-04 | Discharge status: Against Medical Advice | Attending: Emergency Medicine | Admitting: Emergency Medicine

## 2024-01-04 ENCOUNTER — Encounter (HOSPITAL_COMMUNITY): Payer: Self-pay

## 2024-01-04 ENCOUNTER — Emergency Department (HOSPITAL_COMMUNITY)

## 2024-01-04 DIAGNOSIS — R079 Chest pain, unspecified: Secondary | ICD-10-CM | POA: Diagnosis not present

## 2024-01-04 DIAGNOSIS — Z5321 Procedure and treatment not carried out due to patient leaving prior to being seen by health care provider: Secondary | ICD-10-CM | POA: Insufficient documentation

## 2024-01-04 DIAGNOSIS — R0789 Other chest pain: Secondary | ICD-10-CM | POA: Diagnosis not present

## 2024-01-04 LAB — COMPREHENSIVE METABOLIC PANEL WITH GFR
ALT: 31 U/L (ref 0–44)
AST: 27 U/L (ref 15–41)
Albumin: 3.8 g/dL (ref 3.5–5.0)
Alkaline Phosphatase: 64 U/L (ref 38–126)
Anion gap: 10 (ref 5–15)
BUN: 15 mg/dL (ref 8–23)
CO2: 23 mmol/L (ref 22–32)
Calcium: 9.3 mg/dL (ref 8.9–10.3)
Chloride: 104 mmol/L (ref 98–111)
Creatinine, Ser: 0.8 mg/dL (ref 0.44–1.00)
GFR, Estimated: >60 mL/min (ref >60)
Glucose, Bld: 144 mg/dL — ABNORMAL HIGH (ref 70–99)
Potassium: 3.7 mmol/L (ref 3.5–5.1)
Sodium: 137 mmol/L (ref 135–145)
Total Bilirubin: 0.7 mg/dL (ref 0.0–1.2)
Total Protein: 6.7 g/dL (ref 6.5–8.1)

## 2024-01-04 LAB — CBC WITH DIFFERENTIAL/PLATELET
Abs Immature Granulocytes: 0.02 K/uL (ref 0.00–0.07)
Basophils Absolute: 0.1 K/uL (ref 0.0–0.1)
Basophils Relative: 1 %
Eosinophils Absolute: 0.2 K/uL (ref 0.0–0.5)
Eosinophils Relative: 3 %
HCT: 49.6 % — ABNORMAL HIGH (ref 36.0–46.0)
Hemoglobin: 16.1 g/dL — ABNORMAL HIGH (ref 12.0–15.0)
Immature Granulocytes: 0 %
Lymphocytes Relative: 26 %
Lymphs Abs: 1.9 K/uL (ref 0.7–4.0)
MCH: 28.5 pg (ref 26.0–34.0)
MCHC: 32.5 g/dL (ref 30.0–36.0)
MCV: 87.8 fL (ref 80.0–100.0)
Monocytes Absolute: 0.8 K/uL (ref 0.1–1.0)
Monocytes Relative: 10 %
Neutro Abs: 4.5 K/uL (ref 1.7–7.7)
Neutrophils Relative %: 60 %
Platelets: 296 K/uL (ref 150–400)
RBC: 5.65 MIL/uL — ABNORMAL HIGH (ref 3.87–5.11)
RDW: 13.4 % (ref 11.5–15.5)
WBC: 7.4 K/uL (ref 4.0–10.5)
nRBC: 0 % (ref 0.0–0.2)

## 2024-01-04 LAB — TROPONIN I (HIGH SENSITIVITY)
Troponin I (High Sensitivity): 3 ng/L (ref <18)
Troponin I (High Sensitivity): 4 ng/L (ref <18)

## 2024-01-04 MED ORDER — ASPIRIN 81 MG PO CHEW: 162.0000 mg | CHEWABLE_TABLET | Freq: Once | ORAL | Status: DC

## 2024-01-04 NOTE — ED Triage Notes (Signed)
 Reports chest pain centrally that started around 130pm.  Denies radiating anywhere.  Reports intensity comes and goes.  Denies sob n/v sweating.

## 2024-01-04 NOTE — ED Provider Triage Note (Signed)
 Emergency Medicine Provider Triage Evaluation Note  [name] [name] , a 63 y.o. female  was evaluated in triage.  Pt complains of chest pain that started approximately an hour ago when she was not doing anything in particular.  Pain has been constant and has never had anything like this before.  Did not start after eating but seems to be worse if she tries to get up or do something.  Denies any shortness of breath, nausea or vomiting or abdominal pain  Review of Systems  Positive: Chest pain Negative: Shortness of breath, abdominal pain, nausea or vomiting  Physical Exam  BP (!) 161/84 (BP Location: Left Arm)   Pulse 68   Temp 98.2 F (36.8 C)   Resp 17   LMP  (LMP Unknown)   SpO2 97%  Gen:   Awake, no distress   Resp:  Normal effort  MSK:   Moves extremities without difficulty  Other:  No chest wall tenderness or abdominal pain.  Patient can speak in full sentences.  Medical Decision Making  Medically screening exam initiated at 3:00 PM.  Appropriate orders placed.  [name] [name] was informed that the remainder of the evaluation will be completed by another provider, this initial triage assessment does not replace that evaluation, and the importance of remaining in the ED until their evaluation is complete.  Patient here with sudden onset of chest pain.  Does have risk factors of secondary polycythemia, hypertension, hyperlipidemia and family history of her father in his 59s with bypass surgery.  Initial EKG without acute findings.  Patient given an additional 3 aspirin as she already had an 81 mg this morning.   Doretha Folks, MD 01/04/24 [phone]

## 2024-01-05 ENCOUNTER — Encounter: Payer: Self-pay | Admitting: Internal Medicine

## 2024-01-06 NOTE — Telephone Encounter (Signed)
 She needs to be seen.

## 2024-01-09 ENCOUNTER — Inpatient Hospital Stay: Admitting: Internal Medicine

## 2024-01-23 ENCOUNTER — Other Ambulatory Visit: Payer: Self-pay | Admitting: Internal Medicine

## 2024-01-23 DIAGNOSIS — I10 Essential (primary) hypertension: Secondary | ICD-10-CM

## 2024-01-23 DIAGNOSIS — E876 Hypokalemia: Secondary | ICD-10-CM

## 2024-01-23 DIAGNOSIS — E785 Hyperlipidemia, unspecified: Secondary | ICD-10-CM

## 2024-02-02 DIAGNOSIS — D23121 Other benign neoplasm of skin of left upper eyelid, including canthus: Secondary | ICD-10-CM | POA: Diagnosis not present

## 2024-02-02 DIAGNOSIS — H2513 Age-related nuclear cataract, bilateral: Secondary | ICD-10-CM | POA: Diagnosis not present

## 2024-02-02 DIAGNOSIS — H04123 Dry eye syndrome of bilateral lacrimal glands: Secondary | ICD-10-CM | POA: Diagnosis not present

## 2024-02-02 DIAGNOSIS — E119 Type 2 diabetes mellitus without complications: Secondary | ICD-10-CM | POA: Diagnosis not present

## 2024-02-02 LAB — OPHTHALMOLOGY REPORT-SCANNED

## 2024-02-04 ENCOUNTER — Ambulatory Visit: Admitting: Hematology and Oncology

## 2024-02-04 ENCOUNTER — Other Ambulatory Visit

## 2024-02-04 ENCOUNTER — Ambulatory Visit: Admitting: Internal Medicine

## 2024-02-04 ENCOUNTER — Encounter: Payer: Self-pay | Admitting: Internal Medicine

## 2024-02-04 ENCOUNTER — Other Ambulatory Visit: Payer: Self-pay | Admitting: Internal Medicine

## 2024-02-04 VITALS — BP 160/90 | HR 69 | Temp 97.8°F | Resp 16 | Ht 66.0 in | Wt 214.6 lb

## 2024-02-04 DIAGNOSIS — R6882 Decreased libido: Secondary | ICD-10-CM | POA: Insufficient documentation

## 2024-02-04 DIAGNOSIS — R072 Precordial pain: Secondary | ICD-10-CM | POA: Diagnosis not present

## 2024-02-04 DIAGNOSIS — I10 Essential (primary) hypertension: Secondary | ICD-10-CM | POA: Diagnosis not present

## 2024-02-04 DIAGNOSIS — E785 Hyperlipidemia, unspecified: Secondary | ICD-10-CM

## 2024-02-04 DIAGNOSIS — B3749 Other urogenital candidiasis: Secondary | ICD-10-CM

## 2024-02-04 DIAGNOSIS — G3184 Mild cognitive impairment, so stated: Secondary | ICD-10-CM | POA: Diagnosis not present

## 2024-02-04 DIAGNOSIS — E118 Type 2 diabetes mellitus with unspecified complications: Secondary | ICD-10-CM | POA: Diagnosis not present

## 2024-02-04 DIAGNOSIS — N951 Menopausal and female climacteric states: Secondary | ICD-10-CM | POA: Insufficient documentation

## 2024-02-04 DIAGNOSIS — F5101 Primary insomnia: Secondary | ICD-10-CM | POA: Insufficient documentation

## 2024-02-04 DIAGNOSIS — R0609 Other forms of dyspnea: Secondary | ICD-10-CM | POA: Diagnosis not present

## 2024-02-04 DIAGNOSIS — R7303 Prediabetes: Secondary | ICD-10-CM | POA: Diagnosis not present

## 2024-02-04 DIAGNOSIS — E611 Iron deficiency: Secondary | ICD-10-CM | POA: Insufficient documentation

## 2024-02-04 DIAGNOSIS — Z23 Encounter for immunization: Secondary | ICD-10-CM | POA: Diagnosis not present

## 2024-02-04 DIAGNOSIS — E88819 Insulin resistance, unspecified: Secondary | ICD-10-CM | POA: Insufficient documentation

## 2024-02-04 LAB — BASIC METABOLIC PANEL WITH GFR
BUN: 18 mg/dL (ref 6–23)
CO2: 29 meq/L (ref 19–32)
Calcium: 10.2 mg/dL (ref 8.4–10.5)
Chloride: 101 meq/L (ref 96–112)
Creatinine, Ser: 0.72 mg/dL (ref 0.40–1.20)
GFR: 89.17 mL/min (ref >60.00)
Glucose, Bld: 115 mg/dL — ABNORMAL HIGH (ref 70–99)
Potassium: 3.6 meq/L (ref 3.5–5.1)
Sodium: 138 meq/L (ref 135–145)

## 2024-02-04 LAB — MICROALBUMIN / CREATININE URINE RATIO
Creatinine,U: 53.1 mg/dL
Microalb Creat Ratio: UNDETERMINED mg/g (ref 0.0–30.0)
Microalb, Ur: <0.7 mg/dL

## 2024-02-04 LAB — BRAIN NATRIURETIC PEPTIDE: Pro B Natriuretic peptide (BNP): 12 pg/mL (ref 0.0–100.0)

## 2024-02-04 LAB — TROPONIN I (HIGH SENSITIVITY): High Sens Troponin I: 5 ng/L (ref 2–17)

## 2024-02-04 LAB — LIPID PANEL
Cholesterol: 138 mg/dL (ref 0–200)
HDL: 39.6 mg/dL (ref >39.00)
LDL Cholesterol: 63 mg/dL (ref 0–99)
NonHDL: 98.59
Total CHOL/HDL Ratio: 3
Triglycerides: 178 mg/dL — ABNORMAL HIGH (ref 0.0–149.0)
VLDL: 35.6 mg/dL (ref 0.0–40.0)

## 2024-02-04 LAB — TSH: TSH: 1.72 u[IU]/mL (ref 0.35–5.50)

## 2024-02-04 LAB — D-DIMER, QUANTITATIVE: D-Dimer, Quant: <0.19 ug{FEU}/mL (ref <0.50)

## 2024-02-04 MED ORDER — AMLODIPINE BESYLATE 5 MG PO TABS: 5.0000 mg | ORAL_TABLET | Freq: Every day | ORAL | 0 refills | Status: AC

## 2024-02-04 MED ORDER — OLMESARTAN MEDOXOMIL 20 MG PO TABS: 20.0000 mg | ORAL_TABLET | Freq: Every day | ORAL | 0 refills | Status: AC

## 2024-02-04 MED ORDER — ASPIRIN EC 81 MG PO TBEC: 81.0000 mg | DELAYED_RELEASE_TABLET | Freq: Every day | ORAL | 1 refills | Status: AC

## 2024-02-04 NOTE — Progress Notes (Unsigned)
 Subjective:  Patient ID: [name] [name], female    DOB: 1960/09/18  Age: 63 y.o. MRN: [id number]  CC: Chest Pain (01/04/2024 for chest pain. Patient states that she seen a doctor briefly but she left because she didn't wait. ), Hyperlipidemia, and Hypertension   HPI [name] [name] presents for f/up ---  Discussed the use of AI scribe software for clinical note transcription with the patient, who gave verbal consent to proceed.  History of Present Illness [name] [name] is a 63 year old female who presents with chest pain and elevated blood pressure.  Approximately one month ago, she experienced acute chest pain located more exteriorly, lasting more than fifteen minutes, prompting a visit to the emergency room. She underwent tests and blood work but left without a definitive conclusion. There has been no recurrence of the chest pain since that episode.  Her blood pressure was high during the ER visit and remains elevated today. She attributes this to increased stress from working from home for the past three months and managing her husband's physical limitations. She experiences occasional shortness of breath during activities such as climbing stairs, but not specifically during exercise. There is no history of cardiac disease, heart attacks, or heart failure, although there is a family history of cardiac issues.  She experiences occasional palpitations, described as a strong, irregular heartbeat, but denies any associated weakness, dizziness, or lightheadedness. She takes omeprazole daily for heartburn or indigestion and denies any recent issues with swallowing, nausea, vomiting, or diarrhea.  She reports blurry vision and morning headaches, attributing them to stress. An eye examination revealed two cataracts, one small and one medium.  She mentions slight swelling in her legs or feet and recent weight gain despite efforts to eat healthily and reduce sugar intake. She associates the onset of  polycythemia with receiving COVID vaccines, although she acknowledges this is her personal belief.     Outpatient Medications Prior to Visit  Medication Sig Dispense Refill   atorvastatin  (LIPITOR) 10 MG tablet TAKE 1 TABLET BY MOUTH EVERY DAY 90 tablet 1   DULoxetine  (CYMBALTA ) 60 MG capsule TAKE 1 CAPSULE BY MOUTH EVERY DAY 90 capsule 1   estradiol  (ESTRACE ) 1 MG tablet Take 1 mg by mouth daily.     Naproxen Sodium (ALEVE PO) Take 2 tablets by mouth every 12 (twelve) hours.     omeprazole (PRILOSEC) 20 MG capsule Take 20 mg by mouth daily.     spironolactone  (ALDACTONE ) 25 MG tablet TAKE 1 TABLET BY MOUTH EVERY DAY 90 tablet 1   aspirin EC 81 MG tablet Take 81 mg by mouth daily. Swallow whole.     metoprolol  tartrate (LOPRESSOR ) 25 MG tablet TAKE 1 TABLET(25 MG) BY MOUTH TWICE DAILY 180 tablet 0   No facility-administered medications prior to visit.    ROS Review of Systems  Objective:  BP (!) 160/90 (BP Location: Left Arm, Patient Position: Sitting, Cuff Size: Normal) Comment: BP (R) 160/88  Pulse 69   Temp 97.8 F (36.6 C) (Oral)   Resp 16   Ht 5' 6 (1.676 m)   Wt 214 lb 9.6 oz (97.3 kg)   LMP  (LMP Unknown)   SpO2 97%   BMI 34.64 kg/m   BP Readings from Last 3 Encounters:  02/04/24 (!) 160/90  01/04/24 (!) 161/84  08/06/23 (!) 171/81    Wt Readings from Last 3 Encounters:  02/04/24 214 lb 9.6 oz (97.3 kg)  01/04/24 209 lb 10.5 oz (95.1 kg)  08/06/23 209 lb 9.6 oz (95.1 kg)    Physical Exam  Lab Results  Component Value Date   WBC 7.4 01/04/2024   HGB 16.1 (H) 01/04/2024   HCT 49.6 (H) 01/04/2024   PLT 296 01/04/2024   GLUCOSE 144 (H) 01/04/2024   CHOL 135 12/24/2022   TRIG 95.0 12/24/2022   HDL 44.30 12/24/2022   LDLCALC 72 12/24/2022   ALT 31 01/04/2024   AST 27 01/04/2024   NA 137 01/04/2024   K 3.7 01/04/2024   CL 104 01/04/2024   CREATININE 0.80 01/04/2024   BUN 15 01/04/2024   CO2 23 01/04/2024   TSH 1.66 12/24/2022   HGBA1C 5.7  07/08/2023    DG Chest 2 View Result Date: 01/04/2024 EXAM: 2 VIEW(S) XRAY OF THE CHEST 01/04/2024 03:42:00 AM COMPARISON: Comparison is made with 01/02/2022. CLINICAL HISTORY: chest pain. FINDINGS: LUNGS AND PLEURA: Mild bronchitic changes without focal opacity. No pulmonary edema. No pleural effusion. No pneumothorax. HEART AND MEDIASTINUM: Normal cardiac size. BONES AND SOFT TISSUES: Hardware in the cervical spine. There are degenerative osteophytes of the thoracic spine. No acute fracture or other acute osseous abnormality. IMPRESSION: 1. No acute cardiopulmonary process. 2. Mild bronchitic changes without focal opacity. Electronically signed by: Luke Bun MD 01/04/2024 03:52 PM EDT RP Workstation: [id number]    Assessment & Plan:  Type II diabetes mellitus with manifestations (HCC) -     Microalbumin / creatinine urine ratio; Future -     Aspirin  EC; Take 1 tablet (81 mg total) by mouth daily. Swallow whole.  Dispense: 90 tablet; Refill: 1  Essential hypertension -     Basic metabolic panel with GFR; Future -     TSH; Future -     Urinalysis, Routine w reflex microscopic; Future -     Microalbumin / creatinine urine ratio; Future -     amLODIPine  Besylate; Take 1 tablet (5 mg total) by mouth daily.  Dispense: 90 tablet; Refill: 0 -     Olmesartan  Medoxomil; Take 1 tablet (20 mg total) by mouth daily.  Dispense: 90 tablet; Refill: 0  Pre-diabetes -     Basic metabolic panel with GFR; Future -     Hemoglobin A1c; Future  Dyslipidemia, goal LDL below 100 -     Lipid panel; Future -     Aspirin  EC; Take 1 tablet (81 mg total) by mouth daily. Swallow whole.  Dispense: 90 tablet; Refill: 1  MCI (mild cognitive impairment) -     [facility] AD-Detect Phosphorylated tau217(p-tau217), Plasma; Future  Substernal chest pain -     Brain natriuretic peptide; Future -     Troponin I (High Sensitivity); Future -     D-dimer, quantitative; Future -     CT CORONARY MORPH W/CTA COR W/SCORE W/CA  W/CM &/OR WO/CM; Future  DOE (dyspnea on exertion) -     Brain natriuretic peptide; Future -     Troponin I (High Sensitivity); Future -     D-dimer, quantitative; Future -     CT CORONARY MORPH W/CTA COR W/SCORE W/CA W/CM &/OR WO/CM; Future  Need for immunization against influenza -     Flu vaccine trivalent PF, 6mos and older(Flulaval,Afluria,Fluarix,Fluzone)     Follow-up: Return in about 3 months (around 05/06/2024).  Debby Molt, MD

## 2024-02-04 NOTE — Patient Instructions (Signed)
Nonspecific Chest Pain, Adult Chest pain is an uncomfortable, tight, or painful feeling in the chest. The pain can feel like a crushing, aching, or squeezing pressure. A person can feel a burning or tingling sensation. Chest pain can also be felt in your back, neck, jaw, shoulder, or arm. This pain can be worse when you move, sneeze, or take a deep breath. Chest pain can be caused by a condition that is life-threatening. This must be treated right away. It can also be caused by something that is not life-threatening. If you have chest pain, it can be hard to know the difference, so it is important to get help right away to make sure that you do not have a serious condition. Some life-threatening causes of chest pain include: Heart attack. A tear in the body's main blood vessel (aortic dissection). Inflammation around your heart (pericarditis). A problem in the lungs, such as a blood clot (pulmonary embolism) or a collapsed lung (pneumothorax). Some non life-threatening causes of chest pain include: Heartburn. Anxiety or stress. Damage to the bones, muscles, and cartilage that make up your chest wall. Pneumonia or bronchitis. Shingles infection (varicella-zoster virus). Your chest pain may come and go. It may also be constant. Your health care provider will do tests and other studies to find the cause of your pain. Treatment will depend on the cause of your chest pain. Follow these instructions at home: Medicines Take over-the-counter and prescription medicines only as told by your health care provider. If you were prescribed an antibiotic medicine, take it as told by your health care provider. Do not stop taking the antibiotic even if you start to feel better. Activity Avoid any activities that cause chest pain. Do not lift anything that is heavier than 10 lb (4.5 kg), or the limit that you are told, until your health care provider says that it is safe. Rest as directed by your health care  provider. Return to your normal activities only as told by your health care provider. Ask your health care provider what activities are safe for you. Lifestyle     Do not use any products that contain nicotine or tobacco, such as cigarettes, e-cigarettes, and chewing tobacco. If you need help quitting, ask your health care provider. Do not drink alcohol. Make healthy lifestyle changes as recommended. These may include: Getting regular exercise. Ask your health care provider to suggest some exercises that are safe for you. Eating a heart-healthy diet. This includes plenty of fresh fruits and vegetables, whole grains, low-fat (lean) protein, and low-fat dairy products. A dietitian can help you find healthy eating options. Maintaining a healthy weight. Managing any other health conditions you may have, such as high blood pressure (hypertension) or diabetes. Reducing stress, such as with yoga or relaxation techniques. General instructions Pay attention to any changes in your symptoms. It is up to you to get the results of any tests that were done. Ask your health care provider, or the department that is doing the tests, when your results will be ready. Keep all follow-up visits as told by your health care provider. This is important. You may be asked to go for further testing if your chest pain does not go away. Contact a health care provider if: Your chest pain does not go away. You feel depressed. You have a fever. You notice changes in your symptoms or develop new symptoms. Get help right away if: Your chest pain gets worse. You have a cough that gets worse, or you  cough up blood. You have severe pain in your abdomen. You faint. You have sudden, unexplained chest discomfort. You have sudden, unexplained discomfort in your arms, back, neck, or jaw. You have shortness of breath at any time. You suddenly start to sweat, or your skin gets clammy. You feel nausea or you vomit. You  suddenly feel lightheaded or dizzy. You have severe weakness, or unexplained weakness or fatigue. Your heart begins to beat quickly, or it feels like it is skipping beats. These symptoms may represent a serious problem that is an emergency. Do not wait to see if the symptoms will go away. Get medical help right away. Call your local emergency services (911 in the [place] Do not drive yourself to the hospital. Summary Chest pain can be caused by a condition that is serious and requires urgent treatment. It may also be caused by something that is not life-threatening. Your health care provider may do lab tests and other studies to find the cause of your pain. Follow your health care provider's instructions on taking medicines, making lifestyle changes, and getting emergency treatment if symptoms become worse. Keep all follow-up visits as told by your health care provider. This includes visits for any further testing if your chest pain does not go away. This information is not intended to replace advice given to you by your health care provider. Make sure you discuss any questions you have with your health care provider. Document Revised: 01/17/2022 Document Reviewed: 01/17/2022 Elsevier Patient Education  2024 [employer].

## 2024-02-05 ENCOUNTER — Ambulatory Visit: Payer: Self-pay | Admitting: Internal Medicine

## 2024-02-05 DIAGNOSIS — Z23 Encounter for immunization: Secondary | ICD-10-CM | POA: Insufficient documentation

## 2024-02-05 LAB — HEMOGLOBIN A1C: Hgb A1c MFr Bld: 5.6 % (ref 4.6–6.5)

## 2024-02-05 LAB — URINALYSIS, ROUTINE W REFLEX MICROSCOPIC
Bilirubin Urine: NEGATIVE
Hgb urine dipstick: NEGATIVE
Ketones, ur: NEGATIVE
Leukocytes,Ua: NEGATIVE
Nitrite: NEGATIVE
RBC / HPF: NONE SEEN (ref 0–?)
Specific Gravity, Urine: 1.01 (ref 1.000–1.030)
Total Protein, Urine: NEGATIVE
Urine Glucose: NEGATIVE
Urobilinogen, UA: 0.2 (ref 0.0–1.0)
pH: 6 (ref 5.0–8.0)

## 2024-02-05 MED ORDER — FLUCONAZOLE 150 MG PO TABS: 150.0000 mg | ORAL_TABLET | Freq: Once | ORAL | 0 refills | Status: AC

## 2024-02-07 LAB — QUEST AD-DETECT PHOSPHORYLATED TAU217(P-TAU217), PLASMA: Quest Detect PTAU217, Plasma: 0.13 pg/mL (ref <=0.15)

## 2024-02-09 ENCOUNTER — Telehealth: Payer: Self-pay | Admitting: *Deleted

## 2024-02-09 NOTE — Progress Notes (Signed)
 Care Guide Pharmacy Note  02/09/2024 Name: [name] [name] MRN: [id number] DOB: 1960/05/31  Referred By: Joshua Debby CROME, MD Reason for referral: Complex Care Management (Outreach to schedule referral with pharmacist )   [name] [name] is a 63 y.o. year old female who is a primary care patient of Joshua Debby CROME, MD.  [name] [name] was referred to the pharmacist for assistance related to: HTN  Successful contact was made with the patient to discuss pharmacy services including being ready for the pharmacist to call at least 5 minutes before the scheduled appointment time and to have medication bottles and any blood pressure readings ready for review. The patient agreed to meet with the pharmacist via telephone visit on 02/19/2024  Thedford Franks, CMA [place]  [facility], [facility] Guide Direct Dial: [phone]  Fax: [phone] Website: [email or web address]

## 2024-02-19 ENCOUNTER — Other Ambulatory Visit: Admitting: Pharmacist

## 2024-02-19 DIAGNOSIS — I10 Essential (primary) hypertension: Secondary | ICD-10-CM

## 2024-02-19 NOTE — Progress Notes (Signed)
 02/19/2024 Name: [name] [name] MRN: [id number] DOB: February 22, 1961  Chief Complaint  Patient presents with   Hypertension   Medication Management    [name] [name] is a 63 y.o. year old female who presented for a telephone visit.   They were referred to the pharmacist by their PCP for assistance in managing hypertension.    Subjective:  Care Team: Primary Care Provider: Joshua Debby CROME, MD ; Next Scheduled Visit: none scheduled  Medication Access/Adherence  Current Pharmacy:  [id number] - [place], [place] - [address] AT [facility] [address] [place] [address] Phone: [phone] Fax: [phone]   Patient reports affordability concerns with their medications: No  Patient reports access/transportation concerns to their pharmacy: No  Patient reports adherence concerns with their medications:  No     Hypertension:  Current medications: amlodipine  5 mg daily, olmesartan  20 mg daily, spironolactone  25 mg daily Medications previously tried: enalapril /hydrochlorothiazide , hydrochlorothiazide , indapamide , metoprolol  tartrate, telmisarta/hydrochlorothiazide    Patient does not have a validated, automated, upper arm home BP cuff Current blood pressure readings: n/a  Patient denies hypotensive s/sx including dizziness, lightheadedness.   Objective:  Lab Results  Component Value Date   HGBA1C 5.6 02/04/2024    Lab Results  Component Value Date   CREATININE 0.72 02/04/2024   BUN 18 02/04/2024   NA 138 02/04/2024   K 3.6 02/04/2024   CL 101 02/04/2024   CO2 29 02/04/2024    Lab Results  Component Value Date   CHOL 138 02/04/2024   HDL 39.60 02/04/2024   LDLCALC 63 02/04/2024   TRIG 178.0 (H) 02/04/2024   CHOLHDL 3 02/04/2024    Medications Reviewed Today     Reviewed by Merceda Lela SAUNDERS, RPH (Pharmacist) on 02/19/24 at 1532  Med List Status: <None>   Medication Order Taking? Sig Documenting Provider  Last Dose Status Informant  amLODipine  (NORVASC ) 5 MG tablet [id number] Yes Take 1 tablet (5 mg total) by mouth daily. Joshua Debby CROME, MD  Active   aspirin  EC 81 MG tablet [id number]  Take 1 tablet (81 mg total) by mouth daily. Swallow whole. Joshua Debby CROME, MD  Active   atorvastatin  (LIPITOR) 10 MG tablet [id number]  TAKE 1 TABLET BY MOUTH EVERY DAY Joshua Debby CROME, MD  Active   DULoxetine  (CYMBALTA ) 60 MG capsule [id number]  TAKE 1 CAPSULE BY MOUTH EVERY DAY Joshua Debby CROME, MD  Active   estradiol  (ESTRACE ) 1 MG tablet [id number]  Take 1 mg by mouth daily. [provider]  Active   Naproxen Sodium (ALEVE PO) [id number]  Take 2 tablets by mouth every 12 (twelve) hours. [provider]  Active Self  olmesartan  (BENICAR ) 20 MG tablet [id number] Yes Take 1 tablet (20 mg total) by mouth daily. Joshua Debby CROME, MD  Active   omeprazole (PRILOSEC) 20 MG capsule [id number]  Take 20 mg by mouth daily. [provider]  Active Self  spironolactone  (ALDACTONE ) 25 MG tablet [id number] Yes TAKE 1 TABLET BY MOUTH EVERY DAY Joshua Debby CROME, MD  Active               Assessment/Plan:   Hypertension: - Currently uncontrolled, BP goal <130/80. No readings since adding amlodipine  and olmesartan  - Reviewed long term cardiovascular and renal outcomes of uncontrolled blood pressure - Reviewed appropriate blood pressure monitoring technique and reviewed goal blood pressure. Recommended to check home blood pressure and heart rate  - Recommend to continue current regimen - Will  f/u for home BP readings     Follow Up Plan: 2 weeks  Darrelyn Drum, PharmD, BCPS, CPP Clinical Pharmacist Practitioner [facility] [phone]

## 2024-02-19 NOTE — Patient Instructions (Signed)
 It was a pleasure speaking with you today!  Check blood pressure readings at home. I will check back with you in about 2 weeks to review BP readings.  Feel free to call with any questions or concerns!  Darrelyn Drum, PharmD, BCPS, CPP Clinical Pharmacist Practitioner [facility] [phone]

## 2024-02-25 ENCOUNTER — Other Ambulatory Visit: Payer: Self-pay | Admitting: Internal Medicine

## 2024-02-25 DIAGNOSIS — F330 Major depressive disorder, recurrent, mild: Secondary | ICD-10-CM

## 2024-03-01 ENCOUNTER — Ambulatory Visit (HOSPITAL_COMMUNITY)

## 2024-03-08 ENCOUNTER — Encounter (HOSPITAL_COMMUNITY): Payer: Self-pay

## 2024-03-10 ENCOUNTER — Telehealth (HOSPITAL_COMMUNITY): Payer: Self-pay | Admitting: *Deleted

## 2024-03-10 NOTE — Telephone Encounter (Signed)
 Attempted to call patient regarding upcoming cardiac CT appointment. Left message on voicemail with name and callback number Sid Seats RN Navigator Cardiac Imaging [facility] [phone] Office

## 2024-03-12 ENCOUNTER — Encounter (HOSPITAL_COMMUNITY): Payer: Self-pay

## 2024-03-12 ENCOUNTER — Ambulatory Visit (HOSPITAL_COMMUNITY)

## 2024-07-12 ENCOUNTER — Inpatient Hospital Stay: Admitting: Hematology and Oncology

## 2024-07-12 ENCOUNTER — Inpatient Hospital Stay
